# Patient Record
Sex: Male | Born: 1935
Health system: Southern US, Community
[De-identification: ages and names within clinical notes are randomized; demographics above are authoritative.]

## PROBLEM LIST (undated history)

## (undated) DIAGNOSIS — F419 Anxiety disorder, unspecified: Secondary | ICD-10-CM

## (undated) DIAGNOSIS — N400 Enlarged prostate without lower urinary tract symptoms: Secondary | ICD-10-CM

## (undated) DIAGNOSIS — M722 Plantar fascial fibromatosis: Secondary | ICD-10-CM

## (undated) DIAGNOSIS — E785 Hyperlipidemia, unspecified: Secondary | ICD-10-CM

## (undated) DIAGNOSIS — F329 Major depressive disorder, single episode, unspecified: Secondary | ICD-10-CM

## (undated) DIAGNOSIS — I6782 Cerebral ischemia: Secondary | ICD-10-CM

## (undated) DIAGNOSIS — F32A Depression, unspecified: Secondary | ICD-10-CM

## (undated) DIAGNOSIS — I700 Atherosclerosis of aorta: Secondary | ICD-10-CM

## (undated) DIAGNOSIS — D649 Anemia, unspecified: Secondary | ICD-10-CM

## (undated) DIAGNOSIS — I10 Essential (primary) hypertension: Secondary | ICD-10-CM

## (undated) DIAGNOSIS — C679 Malignant neoplasm of bladder, unspecified: Secondary | ICD-10-CM

## (undated) HISTORY — DX: Benign prostatic hyperplasia without lower urinary tract symptoms: N40.0

## (undated) HISTORY — DX: Anemia, unspecified: D64.9

## (undated) HISTORY — DX: Major depressive disorder, single episode, unspecified: F32.9

## (undated) HISTORY — DX: Cerebral ischemia: I67.82

## (undated) HISTORY — DX: Anxiety disorder, unspecified: F41.9

## (undated) HISTORY — DX: Plantar fascial fibromatosis: M72.2

## (undated) HISTORY — DX: Atherosclerosis of aorta: I70.0

## (undated) HISTORY — PX: TONSILLECTOMY: SUR1361

## (undated) HISTORY — PX: EYE SURGERY: SHX253

## (undated) HISTORY — DX: Essential (primary) hypertension: I10

## (undated) HISTORY — DX: Hyperlipidemia, unspecified: E78.5

## (undated) HISTORY — DX: Depression, unspecified: F32.A

## (undated) HISTORY — DX: Malignant neoplasm of bladder, unspecified: C67.9

---

## 2005-05-16 HISTORY — PX: BLADDER SURGERY: SHX569

## 2010-02-15 ENCOUNTER — Ambulatory Visit: Payer: Self-pay | Admitting: Cardiovascular Disease

## 2010-08-30 ENCOUNTER — Other Ambulatory Visit: Payer: Self-pay | Admitting: *Deleted

## 2010-08-30 ENCOUNTER — Encounter: Payer: Self-pay | Admitting: *Deleted

## 2010-08-30 DIAGNOSIS — Z79899 Other long term (current) drug therapy: Secondary | ICD-10-CM

## 2010-08-31 ENCOUNTER — Other Ambulatory Visit (INDEPENDENT_AMBULATORY_CARE_PROVIDER_SITE_OTHER): Payer: Medicare Other | Admitting: *Deleted

## 2010-08-31 ENCOUNTER — Ambulatory Visit (INDEPENDENT_AMBULATORY_CARE_PROVIDER_SITE_OTHER): Payer: Medicare Other | Admitting: Cardiovascular Disease

## 2010-08-31 ENCOUNTER — Encounter: Payer: Self-pay | Admitting: Cardiovascular Disease

## 2010-08-31 VITALS — BP 132/60 | HR 62 | Ht 71.0 in | Wt 207.0 lb

## 2010-08-31 DIAGNOSIS — I951 Orthostatic hypotension: Secondary | ICD-10-CM

## 2010-08-31 DIAGNOSIS — I10 Essential (primary) hypertension: Secondary | ICD-10-CM

## 2010-08-31 DIAGNOSIS — Z79899 Other long term (current) drug therapy: Secondary | ICD-10-CM

## 2010-08-31 HISTORY — DX: Orthostatic hypotension: I95.1

## 2010-08-31 LAB — BASIC METABOLIC PANEL
CO2: 26 mEq/L (ref 19–32)
Calcium: 9.6 mg/dL (ref 8.4–10.5)
Creatinine, Ser: 1 mg/dL (ref 0.4–1.5)
GFR: 73.42 mL/min (ref >60.00)
Sodium: 138 mEq/L (ref 135–145)

## 2010-08-31 MED ORDER — ATENOLOL 25 MG PO TABS: 25.0000 mg | ORAL_TABLET | Freq: Every day | ORAL | Status: DC

## 2010-08-31 NOTE — Progress Notes (Signed)
David Steele Date of Birth  [birth year] [facility] / [facility] [address]     [address] [place], [place]  [address] [phone]  Fax  [phone]  History of Present Illness:  Mr. David Steele is an elderly gentleman with a history of hypertension, diabetes mellitus, hyperlipidemia, and orthostatic hypotension. He has done fairly well since I last saw him 6 months ago.  He continues to have intermittent episodes of orthostasis.  Current Outpatient Prescriptions on File Prior to Visit  Medication Sig Dispense Refill  . aspirin 81 MG tablet Take 81 mg by mouth daily.        . finasteride (PROSCAR) 5 MG tablet Take 5 mg by mouth daily.        . Insulin Isophane Human (HUMULIN N [place]) Inject 30 Units into the skin daily at 6 (six) AM.        . metFORMIN (GLUCOPHAGE) 1000 MG tablet Take 1,000 mg by mouth daily with breakfast. 1 1/2 tab daily       . PARoxetine (PAXIL) 10 MG tablet Take 10 mg by mouth every morning.        . quinapril (ACCUPRIL) 40 MG tablet Take 40 mg by mouth at bedtime.        . simvastatin (ZOCOR) 40 MG tablet Take 40 mg by mouth at bedtime.        Marland Kitchen DISCONTD: atenolol (TENORMIN) 50 MG tablet Take 50 mg by mouth daily.          No Known Allergies  Past Medical History  Diagnosis Date  . Coronary artery disease   . Hypertension   . Hyperlipidemia   . Diabetes mellitus   . Bladder cancer   . Anxiety   . Depressive disorder   . Cerebral ischemia   . BPH (benign prostatic hyperplasia)   . Plantar fasciitis     Past Surgical History  Procedure Date  . Eye surgery   . Tonsillectomy     History  Smoking status  . Former Smoker  . Quit date: 05/16/1988  Smokeless tobacco  . Not on file    History  Alcohol Use No    History reviewed. No pertinent family history.  Reviw of Systems:  Reviewed in the HPI.  All other systems are negative.  Physical Exam: BP 132/60  Pulse 62  Ht 5\' 11"  (1.803 m)  Wt 207 lb (93.895  kg)  BMI 28.87 kg/m2 The patient is alert and oriented x 3.  The mood and affect are normal.  The skin is warm and dry.  Color is normal.  The HEENT exam reveals that the sclera are nonicteric.  The mucous membranes are moist.  The carotids are 2+ without bruits.  There is no thyromegaly.  There is no JVD.  The lungs are clear.  The chest wall is non tender.  The heart exam reveals a regular rate with a normal S1 and S2.  There are no murmurs, gallops, or rubs.  The PMI is not displaced.   Abdominal exam reveals good bowel sounds.  There is no guarding or rebound.  There is no hepatosplenomegaly or tenderness.  There are no masses.  Exam of the legs reveal no clubbing, cyanosis, or edema.  The legs are without rashes.  The distal pulses are intact.  Cranial nerves II - XII are intact.  Motor and sensory functions are intact.  The gait is normal.  Assessment / Plan:

## 2010-08-31 NOTE — Assessment & Plan Note (Signed)
David Steele is doing quite well from a cardiac standpoint. He continues to have occasional episodes of orthostatic hypotension. We will decrease his atenolol from 50 mg to 25 mg a day.

## 2010-09-02 ENCOUNTER — Telehealth: Payer: Self-pay | Admitting: *Deleted

## 2010-09-02 NOTE — Telephone Encounter (Signed)
Spoke with spouse and gave results of lab work to her

## 2010-09-06 ENCOUNTER — Telehealth: Payer: Self-pay | Admitting: *Deleted

## 2010-09-06 NOTE — Telephone Encounter (Signed)
Pt unable to hear me on phone, called PCP dr Johnston Ebbs  to leave msg with office and i faxed the dr with results of elevated glucose/ pt has appointment next week.Alfonso Ramus RN

## 2011-01-07 ENCOUNTER — Telehealth: Payer: Self-pay | Admitting: Cardiovascular Disease

## 2011-01-07 NOTE — Telephone Encounter (Signed)
Called pt to reschedule 02/14/11 dr nahser appt no answer

## 2011-02-14 ENCOUNTER — Ambulatory Visit: Payer: Medicare Other | Admitting: Cardiovascular Disease

## 2011-03-29 ENCOUNTER — Ambulatory Visit (INDEPENDENT_AMBULATORY_CARE_PROVIDER_SITE_OTHER): Payer: Medicare Other | Admitting: Cardiovascular Disease

## 2011-03-29 ENCOUNTER — Encounter: Payer: Self-pay | Admitting: Cardiovascular Disease

## 2011-03-29 DIAGNOSIS — I10 Essential (primary) hypertension: Secondary | ICD-10-CM

## 2011-03-29 DIAGNOSIS — I951 Orthostatic hypotension: Secondary | ICD-10-CM

## 2011-03-29 NOTE — Assessment & Plan Note (Signed)
His blood pressure has been well-controlled. We'll continue the same medications.

## 2011-03-29 NOTE — Assessment & Plan Note (Signed)
He's not had any recent episodes of orthostatic hypotension.

## 2011-03-29 NOTE — Patient Instructions (Signed)
Your physician wants you to follow-up in:  6 months. You will receive a reminder letter in the mail two months in advance. If you don't receive a letter, please call our office to schedule the follow-up appointment.   

## 2011-03-29 NOTE — Progress Notes (Signed)
  [place] Antolin Date of Birth  1925/10/14  [facility] [address]    [address] [place], [place]  [address] [phone]  Fax  [phone]  History of Present Illness:  Mr. Grieshop is an 77 year old gentleman with a history of hypertension, diabetes mellitus, and hyperlipidemia , and presyncope.   He has done fairly well. He has a history of some chest pains in the past and may have had a heart catheterization in [place].  I've seen him for the past several years. He's done for well. He needs fairly active. He's been be deer hunting. He's also been fishing without any episodes of chest pain or shortness of breath.    Current Outpatient Prescriptions on File Prior to Visit  Medication Sig Dispense Refill  . aspirin 81 MG tablet Take 81 mg by mouth daily.        Marland Kitchen atenolol (TENORMIN) 25 MG tablet Take 1 tablet (25 mg total) by mouth daily.  90 tablet  3  . finasteride (PROSCAR) 5 MG tablet Take 5 mg by mouth daily.        . Insulin Isophane Human (HUMULIN N [place]) Inject 30 Units into the skin daily at 6 (six) AM.        . metFORMIN (GLUCOPHAGE) 1000 MG tablet Take 1,000 mg by mouth daily with breakfast. 1 1/2 tab daily       . PARoxetine (PAXIL) 10 MG tablet Take 10 mg by mouth every morning.        . quinapril (ACCUPRIL) 40 MG tablet Take 40 mg by mouth at bedtime.        . simvastatin (ZOCOR) 40 MG tablet Take 40 mg by mouth at bedtime.          No Known Allergies  Past Medical History  Diagnosis Date  . Hypertension   . Hyperlipidemia   . Diabetes mellitus   . Bladder cancer   . Anxiety   . Depressive disorder   . Cerebral ischemia   . BPH (benign prostatic hyperplasia)   . Plantar fasciitis     Past Surgical History  Procedure Date  . Eye surgery   . Tonsillectomy     History  Smoking status  . Former Smoker  . Quit date: 05/16/1988  Smokeless tobacco  . Not on file    History  Alcohol Use No    No family history on file.  Reviw of  Systems:  Reviewed in the HPI.  All other systems are negative.  Physical Exam: BP 124/70  Pulse 50  Ht 5\' 11"  (1.803 m)  Wt 208 lb (94.348 kg)  BMI 29.01 kg/m2 The patient is alert and oriented x 3.  The mood and affect are normal.   Skin: warm and dry.  Color is normal.    HEENT:   Normocephalic/atraumatic. He has no JVD. Carotids are normal.  Lungs: His lungs are clear to auscultation.   Heart: Regular rate S1-S2.    Abdomen: His abdomen is soft. Good bowel sounds.  Extremities:  No clubbing sinuses or edema the  Neuro:  Exam is nonfocal.  His gait is normal.    ECG: Sinus bradycardia.  Assessment / Plan:

## 2011-08-04 DIAGNOSIS — E782 Mixed hyperlipidemia: Secondary | ICD-10-CM | POA: Diagnosis not present

## 2011-08-04 DIAGNOSIS — E119 Type 2 diabetes mellitus without complications: Secondary | ICD-10-CM | POA: Diagnosis not present

## 2011-08-04 DIAGNOSIS — I10 Essential (primary) hypertension: Secondary | ICD-10-CM | POA: Diagnosis not present

## 2011-08-04 DIAGNOSIS — Z79899 Other long term (current) drug therapy: Secondary | ICD-10-CM | POA: Diagnosis not present

## 2011-08-04 DIAGNOSIS — E1149 Type 2 diabetes mellitus with other diabetic neurological complication: Secondary | ICD-10-CM | POA: Diagnosis not present

## 2011-09-26 ENCOUNTER — Ambulatory Visit (INDEPENDENT_AMBULATORY_CARE_PROVIDER_SITE_OTHER): Payer: Medicare Other | Admitting: Cardiovascular Disease

## 2011-09-26 ENCOUNTER — Encounter: Payer: Self-pay | Admitting: Cardiovascular Disease

## 2011-09-26 VITALS — BP 137/66 | HR 55 | Ht 71.0 in | Wt 208.1 lb

## 2011-09-26 DIAGNOSIS — I10 Essential (primary) hypertension: Secondary | ICD-10-CM

## 2011-09-26 DIAGNOSIS — E785 Hyperlipidemia, unspecified: Secondary | ICD-10-CM

## 2011-09-26 DIAGNOSIS — E119 Type 2 diabetes mellitus without complications: Secondary | ICD-10-CM | POA: Diagnosis not present

## 2011-09-26 NOTE — Assessment & Plan Note (Signed)
David Steele is doing very well. We'll continue with the same medications. We'll check fasting labs today. I'll see him back in 6 months for an office visit, fasting labs, and EKG.

## 2011-09-26 NOTE — Progress Notes (Signed)
[name] Date of Birth  02-Sep-1925       [facility]    [place] [place] [address]  [address] [place], [place]  [address]   [place], [place]  [address] [phone]     [phone]   Fax  [phone]    Fax [phone]  Problem List: 1. History of presyncope 2. Hypertension 3. Diabetes mellitus 4. Hyperlipidemia 5. Small subdural hematoma by the MRI scan  History of Present Illness:  Mr. Kersh is an 78 year old gentleman with a history of hypertension, diabetes mellitus, and hyperlipidemia , and presyncope. He has done fairly well. He has a history of some chest pains in the past and may have had a heart catheterization in [place].   He's having arthritis pain.  He remains active working on his form. He's also garden. He is able to do all his normal activities without any significant problems.  He's had problems with dehydration and typically will have some presyncope if he becomes too dehydrated. When we talked about keeping some water or Gatorade in his tractor when he is out forming.   Current Outpatient Prescriptions on File Prior to Visit  Medication Sig Dispense Refill  . aspirin 81 MG tablet Take 81 mg by mouth daily.        Marland Kitchen atenolol (TENORMIN) 25 MG tablet Take 1 tablet (25 mg total) by mouth daily.  90 tablet  3  . finasteride (PROSCAR) 5 MG tablet Take 5 mg by mouth daily.        . Insulin Isophane Human (HUMULIN N [place]) Inject 30 Units into the skin daily at 6 (six) AM.        . metFORMIN (GLUCOPHAGE) 1000 MG tablet Take 1,000 mg by mouth daily with breakfast. 1 1/2 tab daily       . PARoxetine (PAXIL) 10 MG tablet Take 10 mg by mouth every morning.        . quinapril (ACCUPRIL) 40 MG tablet Take 40 mg by mouth at bedtime.        . simvastatin (ZOCOR) 40 MG tablet Take 40 mg by mouth at bedtime.          No Known Allergies  Past Medical History  Diagnosis Date  . Hypertension   . Hyperlipidemia   .  Diabetes mellitus   . Bladder cancer   . Anxiety   . Depressive disorder   . Cerebral ischemia   . BPH (benign prostatic hyperplasia)   . Plantar fasciitis     Past Surgical History  Procedure Date  . Eye surgery   . Tonsillectomy     History  Smoking status  . Former Smoker  . Quit date: 05/16/1988  Smokeless tobacco  . Not on file    History  Alcohol Use No    No family history on file.  Reviw of Systems:  Reviewed in the HPI.  All other systems are negative.  Physical Exam: Blood pressure 137/66, pulse 55, height 5\' 11"  (1.803 m), weight 208 lb 1.9 oz (94.403 kg). General: Well developed, well nourished, in no acute distress.  Head: Normocephalic, atraumatic, sclera non-icteric, mucus membranes are moist,   Neck: Supple. Carotids are 2 + without bruits. No JVD  Lungs: Clear bilaterally to auscultation.  Heart: regular rate.  normal  S1 S2. No murmurs, gallops or rubs.  Abdomen: Soft, non-tender, non-distended with normal bowel sounds. No hepatomegaly. No rebound/guarding. No masses.  Msk:  Strength and  tone are normal  Extremities: No clubbing or cyanosis. No edema.  Distal pedal pulses are 2+   Neuro: Alert and oriented X 3. Moves all extremities spontaneously.  Psych:  Responds to questions appropriately with a normal affect.  ECG:  Assessment / Plan:

## 2011-09-26 NOTE — Patient Instructions (Signed)
Your physician recommends that you return for a FASTING lipid profile: TODAY AND IN 6 MONTHS  Your physician wants you to follow-up in: 6 MONTHS  You will receive a reminder letter in the mail two months in advance. If you don't receive a letter, please call our office to schedule the follow-up appointment.   

## 2011-09-27 LAB — BASIC METABOLIC PANEL
CO2: 26 mEq/L (ref 19–32)
Chloride: 107 mEq/L (ref 96–112)
GFR: 82.39 mL/min (ref >60.00)
Glucose, Bld: 164 mg/dL — ABNORMAL HIGH (ref 70–99)
Potassium: 4.4 mEq/L (ref 3.5–5.1)
Sodium: 139 mEq/L (ref 135–145)

## 2011-09-27 LAB — HEPATIC FUNCTION PANEL
ALT: 16 U/L (ref 0–53)
Total Bilirubin: 0.7 mg/dL (ref 0.3–1.2)

## 2011-09-27 LAB — LIPID PANEL
HDL: 43.6 mg/dL (ref >39.00)
VLDL: 23.4 mg/dL (ref 0.0–40.0)

## 2011-11-10 DIAGNOSIS — I10 Essential (primary) hypertension: Secondary | ICD-10-CM | POA: Diagnosis not present

## 2011-11-10 DIAGNOSIS — E782 Mixed hyperlipidemia: Secondary | ICD-10-CM | POA: Diagnosis not present

## 2011-11-10 DIAGNOSIS — E119 Type 2 diabetes mellitus without complications: Secondary | ICD-10-CM | POA: Diagnosis not present

## 2012-02-16 DIAGNOSIS — I10 Essential (primary) hypertension: Secondary | ICD-10-CM | POA: Diagnosis not present

## 2012-02-16 DIAGNOSIS — E782 Mixed hyperlipidemia: Secondary | ICD-10-CM | POA: Diagnosis not present

## 2012-02-16 DIAGNOSIS — Z79899 Other long term (current) drug therapy: Secondary | ICD-10-CM | POA: Diagnosis not present

## 2012-02-16 DIAGNOSIS — E119 Type 2 diabetes mellitus without complications: Secondary | ICD-10-CM | POA: Diagnosis not present

## 2012-02-16 DIAGNOSIS — Z23 Encounter for immunization: Secondary | ICD-10-CM | POA: Diagnosis not present

## 2012-02-17 DIAGNOSIS — S058X9A Other injuries of unspecified eye and orbit, initial encounter: Secondary | ICD-10-CM | POA: Diagnosis not present

## 2012-02-17 DIAGNOSIS — T1590XA Foreign body on external eye, part unspecified, unspecified eye, initial encounter: Secondary | ICD-10-CM | POA: Diagnosis not present

## 2012-02-17 DIAGNOSIS — T1500XA Foreign body in cornea, unspecified eye, initial encounter: Secondary | ICD-10-CM | POA: Diagnosis not present

## 2012-02-20 DIAGNOSIS — H44619 Retained (old) magnetic foreign body in anterior chamber, unspecified eye: Secondary | ICD-10-CM | POA: Diagnosis not present

## 2012-02-21 DIAGNOSIS — H44619 Retained (old) magnetic foreign body in anterior chamber, unspecified eye: Secondary | ICD-10-CM | POA: Diagnosis not present

## 2012-03-15 DIAGNOSIS — Z961 Presence of intraocular lens: Secondary | ICD-10-CM | POA: Diagnosis not present

## 2012-04-16 DIAGNOSIS — C679 Malignant neoplasm of bladder, unspecified: Secondary | ICD-10-CM | POA: Diagnosis not present

## 2012-04-16 DIAGNOSIS — C61 Malignant neoplasm of prostate: Secondary | ICD-10-CM | POA: Diagnosis not present

## 2012-05-31 DIAGNOSIS — I10 Essential (primary) hypertension: Secondary | ICD-10-CM | POA: Diagnosis not present

## 2012-05-31 DIAGNOSIS — Z79899 Other long term (current) drug therapy: Secondary | ICD-10-CM | POA: Diagnosis not present

## 2012-05-31 DIAGNOSIS — E782 Mixed hyperlipidemia: Secondary | ICD-10-CM | POA: Diagnosis not present

## 2012-05-31 DIAGNOSIS — E119 Type 2 diabetes mellitus without complications: Secondary | ICD-10-CM | POA: Diagnosis not present

## 2012-07-18 DIAGNOSIS — S61409A Unspecified open wound of unspecified hand, initial encounter: Secondary | ICD-10-CM | POA: Diagnosis not present

## 2012-09-11 DIAGNOSIS — E782 Mixed hyperlipidemia: Secondary | ICD-10-CM | POA: Diagnosis not present

## 2012-09-11 DIAGNOSIS — E119 Type 2 diabetes mellitus without complications: Secondary | ICD-10-CM | POA: Diagnosis not present

## 2012-09-11 DIAGNOSIS — Z79899 Other long term (current) drug therapy: Secondary | ICD-10-CM | POA: Diagnosis not present

## 2012-09-11 DIAGNOSIS — I10 Essential (primary) hypertension: Secondary | ICD-10-CM | POA: Diagnosis not present

## 2012-10-30 DIAGNOSIS — N471 Phimosis: Secondary | ICD-10-CM | POA: Diagnosis not present

## 2012-10-30 DIAGNOSIS — C679 Malignant neoplasm of bladder, unspecified: Secondary | ICD-10-CM | POA: Diagnosis not present

## 2012-10-30 DIAGNOSIS — C61 Malignant neoplasm of prostate: Secondary | ICD-10-CM | POA: Diagnosis not present

## 2012-12-18 DIAGNOSIS — L02818 Cutaneous abscess of other sites: Secondary | ICD-10-CM | POA: Diagnosis not present

## 2012-12-18 DIAGNOSIS — E782 Mixed hyperlipidemia: Secondary | ICD-10-CM | POA: Diagnosis not present

## 2012-12-18 DIAGNOSIS — E119 Type 2 diabetes mellitus without complications: Secondary | ICD-10-CM | POA: Diagnosis not present

## 2012-12-18 DIAGNOSIS — Z79899 Other long term (current) drug therapy: Secondary | ICD-10-CM | POA: Diagnosis not present

## 2012-12-18 DIAGNOSIS — I10 Essential (primary) hypertension: Secondary | ICD-10-CM | POA: Diagnosis not present

## 2013-02-27 DIAGNOSIS — Z23 Encounter for immunization: Secondary | ICD-10-CM | POA: Diagnosis not present

## 2013-04-18 DIAGNOSIS — I10 Essential (primary) hypertension: Secondary | ICD-10-CM | POA: Diagnosis not present

## 2013-04-18 DIAGNOSIS — E119 Type 2 diabetes mellitus without complications: Secondary | ICD-10-CM | POA: Diagnosis not present

## 2013-04-18 DIAGNOSIS — Z79899 Other long term (current) drug therapy: Secondary | ICD-10-CM | POA: Diagnosis not present

## 2013-04-18 DIAGNOSIS — E782 Mixed hyperlipidemia: Secondary | ICD-10-CM | POA: Diagnosis not present

## 2013-04-29 DIAGNOSIS — C679 Malignant neoplasm of bladder, unspecified: Secondary | ICD-10-CM | POA: Diagnosis not present

## 2013-04-29 DIAGNOSIS — C61 Malignant neoplasm of prostate: Secondary | ICD-10-CM | POA: Diagnosis not present

## 2013-04-29 DIAGNOSIS — D090 Carcinoma in situ of bladder: Secondary | ICD-10-CM | POA: Diagnosis not present

## 2013-08-30 DIAGNOSIS — E785 Hyperlipidemia, unspecified: Secondary | ICD-10-CM | POA: Diagnosis not present

## 2013-08-30 DIAGNOSIS — I10 Essential (primary) hypertension: Secondary | ICD-10-CM | POA: Diagnosis not present

## 2013-08-30 DIAGNOSIS — IMO0001 Reserved for inherently not codable concepts without codable children: Secondary | ICD-10-CM | POA: Diagnosis not present

## 2013-08-30 DIAGNOSIS — Z79899 Other long term (current) drug therapy: Secondary | ICD-10-CM | POA: Diagnosis not present

## 2013-10-12 DIAGNOSIS — J189 Pneumonia, unspecified organism: Secondary | ICD-10-CM | POA: Diagnosis not present

## 2013-12-30 DIAGNOSIS — I10 Essential (primary) hypertension: Secondary | ICD-10-CM | POA: Diagnosis not present

## 2013-12-30 DIAGNOSIS — E785 Hyperlipidemia, unspecified: Secondary | ICD-10-CM | POA: Diagnosis not present

## 2013-12-30 DIAGNOSIS — R42 Dizziness and giddiness: Secondary | ICD-10-CM | POA: Diagnosis not present

## 2013-12-30 DIAGNOSIS — IMO0001 Reserved for inherently not codable concepts without codable children: Secondary | ICD-10-CM | POA: Diagnosis not present

## 2013-12-30 DIAGNOSIS — Z79899 Other long term (current) drug therapy: Secondary | ICD-10-CM | POA: Diagnosis not present

## 2014-01-17 DIAGNOSIS — R05 Cough: Secondary | ICD-10-CM | POA: Diagnosis not present

## 2014-01-17 DIAGNOSIS — R059 Cough, unspecified: Secondary | ICD-10-CM | POA: Diagnosis not present

## 2014-01-17 DIAGNOSIS — IMO0001 Reserved for inherently not codable concepts without codable children: Secondary | ICD-10-CM | POA: Diagnosis not present

## 2014-02-26 DIAGNOSIS — Z23 Encounter for immunization: Secondary | ICD-10-CM | POA: Diagnosis not present

## 2014-04-28 DIAGNOSIS — R51 Headache: Secondary | ICD-10-CM | POA: Diagnosis not present

## 2014-04-28 DIAGNOSIS — H538 Other visual disturbances: Secondary | ICD-10-CM | POA: Diagnosis not present

## 2014-04-28 DIAGNOSIS — E119 Type 2 diabetes mellitus without complications: Secondary | ICD-10-CM | POA: Diagnosis not present

## 2014-04-28 DIAGNOSIS — D1800 Hemangioma unspecified site: Secondary | ICD-10-CM | POA: Diagnosis not present

## 2014-04-28 DIAGNOSIS — N4829 Other inflammatory disorders of penis: Secondary | ICD-10-CM | POA: Diagnosis not present

## 2014-04-28 DIAGNOSIS — Z7982 Long term (current) use of aspirin: Secondary | ICD-10-CM | POA: Diagnosis not present

## 2014-04-28 DIAGNOSIS — E780 Pure hypercholesterolemia: Secondary | ICD-10-CM | POA: Diagnosis not present

## 2014-04-28 DIAGNOSIS — R531 Weakness: Secondary | ICD-10-CM | POA: Diagnosis not present

## 2014-04-28 DIAGNOSIS — I10 Essential (primary) hypertension: Secondary | ICD-10-CM | POA: Diagnosis not present

## 2014-05-01 DIAGNOSIS — D1801 Hemangioma of skin and subcutaneous tissue: Secondary | ICD-10-CM | POA: Diagnosis not present

## 2014-05-01 DIAGNOSIS — E1165 Type 2 diabetes mellitus with hyperglycemia: Secondary | ICD-10-CM | POA: Diagnosis not present

## 2014-05-01 DIAGNOSIS — E785 Hyperlipidemia, unspecified: Secondary | ICD-10-CM | POA: Diagnosis not present

## 2014-05-12 DIAGNOSIS — C679 Malignant neoplasm of bladder, unspecified: Secondary | ICD-10-CM | POA: Diagnosis not present

## 2014-05-12 DIAGNOSIS — C61 Malignant neoplasm of prostate: Secondary | ICD-10-CM | POA: Diagnosis not present

## 2014-05-12 DIAGNOSIS — Z87891 Personal history of nicotine dependence: Secondary | ICD-10-CM | POA: Diagnosis not present

## 2014-05-12 DIAGNOSIS — N320 Bladder-neck obstruction: Secondary | ICD-10-CM | POA: Diagnosis not present

## 2014-05-29 DIAGNOSIS — Z6827 Body mass index (BMI) 27.0-27.9, adult: Secondary | ICD-10-CM | POA: Diagnosis not present

## 2014-05-29 DIAGNOSIS — I10 Essential (primary) hypertension: Secondary | ICD-10-CM | POA: Diagnosis not present

## 2014-05-29 DIAGNOSIS — D1801 Hemangioma of skin and subcutaneous tissue: Secondary | ICD-10-CM | POA: Diagnosis not present

## 2014-06-10 DIAGNOSIS — F329 Major depressive disorder, single episode, unspecified: Secondary | ICD-10-CM | POA: Diagnosis not present

## 2014-06-10 DIAGNOSIS — F419 Anxiety disorder, unspecified: Secondary | ICD-10-CM | POA: Diagnosis not present

## 2014-06-10 DIAGNOSIS — G4700 Insomnia, unspecified: Secondary | ICD-10-CM | POA: Diagnosis not present

## 2014-07-22 DIAGNOSIS — F331 Major depressive disorder, recurrent, moderate: Secondary | ICD-10-CM | POA: Diagnosis not present

## 2014-07-22 DIAGNOSIS — G4700 Insomnia, unspecified: Secondary | ICD-10-CM | POA: Diagnosis not present

## 2014-08-12 DIAGNOSIS — C679 Malignant neoplasm of bladder, unspecified: Secondary | ICD-10-CM | POA: Diagnosis not present

## 2014-08-12 DIAGNOSIS — N320 Bladder-neck obstruction: Secondary | ICD-10-CM | POA: Diagnosis not present

## 2014-08-12 DIAGNOSIS — C61 Malignant neoplasm of prostate: Secondary | ICD-10-CM | POA: Diagnosis not present

## 2014-09-23 DIAGNOSIS — M79606 Pain in leg, unspecified: Secondary | ICD-10-CM | POA: Diagnosis not present

## 2014-09-23 DIAGNOSIS — I10 Essential (primary) hypertension: Secondary | ICD-10-CM | POA: Diagnosis not present

## 2014-09-23 DIAGNOSIS — E785 Hyperlipidemia, unspecified: Secondary | ICD-10-CM | POA: Diagnosis not present

## 2014-09-23 DIAGNOSIS — Z79899 Other long term (current) drug therapy: Secondary | ICD-10-CM | POA: Diagnosis not present

## 2014-09-23 DIAGNOSIS — E1165 Type 2 diabetes mellitus with hyperglycemia: Secondary | ICD-10-CM | POA: Diagnosis not present

## 2014-10-23 DIAGNOSIS — Z23 Encounter for immunization: Secondary | ICD-10-CM | POA: Diagnosis not present

## 2014-10-24 DIAGNOSIS — Z79899 Other long term (current) drug therapy: Secondary | ICD-10-CM | POA: Diagnosis not present

## 2014-10-24 DIAGNOSIS — R7989 Other specified abnormal findings of blood chemistry: Secondary | ICD-10-CM | POA: Diagnosis not present

## 2014-12-29 DIAGNOSIS — Z23 Encounter for immunization: Secondary | ICD-10-CM | POA: Diagnosis not present

## 2015-01-26 DIAGNOSIS — E1165 Type 2 diabetes mellitus with hyperglycemia: Secondary | ICD-10-CM | POA: Diagnosis not present

## 2015-01-26 DIAGNOSIS — R51 Headache: Secondary | ICD-10-CM | POA: Diagnosis not present

## 2015-01-26 DIAGNOSIS — I10 Essential (primary) hypertension: Secondary | ICD-10-CM | POA: Diagnosis not present

## 2015-01-26 DIAGNOSIS — E785 Hyperlipidemia, unspecified: Secondary | ICD-10-CM | POA: Diagnosis not present

## 2015-01-26 DIAGNOSIS — Z79899 Other long term (current) drug therapy: Secondary | ICD-10-CM | POA: Diagnosis not present

## 2015-02-24 DIAGNOSIS — N472 Paraphimosis: Secondary | ICD-10-CM | POA: Diagnosis not present

## 2015-02-24 DIAGNOSIS — D090 Carcinoma in situ of bladder: Secondary | ICD-10-CM | POA: Diagnosis not present

## 2015-02-24 DIAGNOSIS — C679 Malignant neoplasm of bladder, unspecified: Secondary | ICD-10-CM | POA: Diagnosis not present

## 2015-02-24 DIAGNOSIS — C61 Malignant neoplasm of prostate: Secondary | ICD-10-CM | POA: Diagnosis not present

## 2015-03-27 DIAGNOSIS — C61 Malignant neoplasm of prostate: Secondary | ICD-10-CM | POA: Diagnosis not present

## 2015-05-28 DIAGNOSIS — J40 Bronchitis, not specified as acute or chronic: Secondary | ICD-10-CM | POA: Diagnosis not present

## 2015-05-28 DIAGNOSIS — E785 Hyperlipidemia, unspecified: Secondary | ICD-10-CM | POA: Diagnosis not present

## 2015-05-28 DIAGNOSIS — Z79899 Other long term (current) drug therapy: Secondary | ICD-10-CM | POA: Diagnosis not present

## 2015-05-28 DIAGNOSIS — F419 Anxiety disorder, unspecified: Secondary | ICD-10-CM | POA: Diagnosis not present

## 2015-05-28 DIAGNOSIS — I10 Essential (primary) hypertension: Secondary | ICD-10-CM | POA: Diagnosis not present

## 2015-05-28 DIAGNOSIS — E1165 Type 2 diabetes mellitus with hyperglycemia: Secondary | ICD-10-CM | POA: Diagnosis not present

## 2015-07-15 DIAGNOSIS — C61 Malignant neoplasm of prostate: Secondary | ICD-10-CM | POA: Diagnosis not present

## 2015-07-15 DIAGNOSIS — R3915 Urgency of urination: Secondary | ICD-10-CM | POA: Diagnosis not present

## 2015-07-15 DIAGNOSIS — D090 Carcinoma in situ of bladder: Secondary | ICD-10-CM | POA: Diagnosis not present

## 2015-08-21 DIAGNOSIS — C61 Malignant neoplasm of prostate: Secondary | ICD-10-CM | POA: Diagnosis not present

## 2015-08-21 DIAGNOSIS — Z789 Other specified health status: Secondary | ICD-10-CM | POA: Diagnosis not present

## 2015-08-21 DIAGNOSIS — Z87891 Personal history of nicotine dependence: Secondary | ICD-10-CM | POA: Diagnosis not present

## 2015-08-21 DIAGNOSIS — R351 Nocturia: Secondary | ICD-10-CM | POA: Diagnosis not present

## 2015-09-25 DIAGNOSIS — I10 Essential (primary) hypertension: Secondary | ICD-10-CM | POA: Diagnosis not present

## 2015-09-25 DIAGNOSIS — E785 Hyperlipidemia, unspecified: Secondary | ICD-10-CM | POA: Diagnosis not present

## 2015-09-25 DIAGNOSIS — M25562 Pain in left knee: Secondary | ICD-10-CM | POA: Diagnosis not present

## 2015-09-25 DIAGNOSIS — E1165 Type 2 diabetes mellitus with hyperglycemia: Secondary | ICD-10-CM | POA: Diagnosis not present

## 2015-09-25 DIAGNOSIS — F419 Anxiety disorder, unspecified: Secondary | ICD-10-CM | POA: Diagnosis not present

## 2015-09-25 DIAGNOSIS — Z79899 Other long term (current) drug therapy: Secondary | ICD-10-CM | POA: Diagnosis not present

## 2015-10-05 DIAGNOSIS — M1712 Unilateral primary osteoarthritis, left knee: Secondary | ICD-10-CM | POA: Diagnosis not present

## 2015-11-05 DIAGNOSIS — M1711 Unilateral primary osteoarthritis, right knee: Secondary | ICD-10-CM | POA: Diagnosis not present

## 2015-11-10 DIAGNOSIS — I10 Essential (primary) hypertension: Secondary | ICD-10-CM | POA: Diagnosis not present

## 2015-11-10 DIAGNOSIS — H60501 Unspecified acute noninfective otitis externa, right ear: Secondary | ICD-10-CM | POA: Diagnosis not present

## 2015-11-13 DIAGNOSIS — H9201 Otalgia, right ear: Secondary | ICD-10-CM | POA: Diagnosis not present

## 2015-11-13 DIAGNOSIS — H60501 Unspecified acute noninfective otitis externa, right ear: Secondary | ICD-10-CM | POA: Diagnosis not present

## 2015-11-19 DIAGNOSIS — H9211 Otorrhea, right ear: Secondary | ICD-10-CM | POA: Diagnosis not present

## 2015-11-19 DIAGNOSIS — H9201 Otalgia, right ear: Secondary | ICD-10-CM | POA: Diagnosis not present

## 2015-11-21 DIAGNOSIS — Z23 Encounter for immunization: Secondary | ICD-10-CM | POA: Diagnosis not present

## 2015-12-10 DIAGNOSIS — E1165 Type 2 diabetes mellitus with hyperglycemia: Secondary | ICD-10-CM | POA: Diagnosis not present

## 2015-12-10 DIAGNOSIS — I10 Essential (primary) hypertension: Secondary | ICD-10-CM | POA: Diagnosis not present

## 2015-12-17 DIAGNOSIS — M1711 Unilateral primary osteoarthritis, right knee: Secondary | ICD-10-CM | POA: Diagnosis not present

## 2015-12-21 DIAGNOSIS — I10 Essential (primary) hypertension: Secondary | ICD-10-CM | POA: Diagnosis not present

## 2015-12-21 DIAGNOSIS — H6123 Impacted cerumen, bilateral: Secondary | ICD-10-CM | POA: Diagnosis not present

## 2016-01-08 ENCOUNTER — Other Ambulatory Visit: Payer: Self-pay

## 2016-01-19 DIAGNOSIS — C61 Malignant neoplasm of prostate: Secondary | ICD-10-CM | POA: Diagnosis not present

## 2016-01-19 DIAGNOSIS — D090 Carcinoma in situ of bladder: Secondary | ICD-10-CM | POA: Diagnosis not present

## 2016-01-21 DIAGNOSIS — Z23 Encounter for immunization: Secondary | ICD-10-CM | POA: Diagnosis not present

## 2016-01-26 DIAGNOSIS — R0602 Shortness of breath: Secondary | ICD-10-CM | POA: Diagnosis not present

## 2016-01-26 DIAGNOSIS — E1165 Type 2 diabetes mellitus with hyperglycemia: Secondary | ICD-10-CM | POA: Diagnosis not present

## 2016-01-26 DIAGNOSIS — E785 Hyperlipidemia, unspecified: Secondary | ICD-10-CM | POA: Diagnosis not present

## 2016-01-26 DIAGNOSIS — I10 Essential (primary) hypertension: Secondary | ICD-10-CM | POA: Diagnosis not present

## 2016-01-26 DIAGNOSIS — Z79899 Other long term (current) drug therapy: Secondary | ICD-10-CM | POA: Diagnosis not present

## 2016-01-26 DIAGNOSIS — F419 Anxiety disorder, unspecified: Secondary | ICD-10-CM | POA: Diagnosis not present

## 2016-05-26 DIAGNOSIS — Z79899 Other long term (current) drug therapy: Secondary | ICD-10-CM | POA: Diagnosis not present

## 2016-05-26 DIAGNOSIS — I10 Essential (primary) hypertension: Secondary | ICD-10-CM | POA: Diagnosis not present

## 2016-05-26 DIAGNOSIS — E1165 Type 2 diabetes mellitus with hyperglycemia: Secondary | ICD-10-CM | POA: Diagnosis not present

## 2016-05-26 DIAGNOSIS — F419 Anxiety disorder, unspecified: Secondary | ICD-10-CM | POA: Diagnosis not present

## 2016-05-26 DIAGNOSIS — G4700 Insomnia, unspecified: Secondary | ICD-10-CM | POA: Diagnosis not present

## 2016-06-07 DIAGNOSIS — E875 Hyperkalemia: Secondary | ICD-10-CM | POA: Diagnosis not present

## 2016-07-19 DIAGNOSIS — C61 Malignant neoplasm of prostate: Secondary | ICD-10-CM | POA: Diagnosis not present

## 2016-08-01 DIAGNOSIS — H61303 Acquired stenosis of external ear canal, unspecified, bilateral: Secondary | ICD-10-CM | POA: Diagnosis not present

## 2016-08-01 DIAGNOSIS — H9193 Unspecified hearing loss, bilateral: Secondary | ICD-10-CM | POA: Diagnosis not present

## 2016-08-01 DIAGNOSIS — H6121 Impacted cerumen, right ear: Secondary | ICD-10-CM | POA: Diagnosis not present

## 2016-09-27 DIAGNOSIS — Z79899 Other long term (current) drug therapy: Secondary | ICD-10-CM | POA: Diagnosis not present

## 2016-09-27 DIAGNOSIS — I10 Essential (primary) hypertension: Secondary | ICD-10-CM | POA: Diagnosis not present

## 2016-09-27 DIAGNOSIS — E1165 Type 2 diabetes mellitus with hyperglycemia: Secondary | ICD-10-CM | POA: Diagnosis not present

## 2016-12-26 DIAGNOSIS — Z23 Encounter for immunization: Secondary | ICD-10-CM | POA: Diagnosis not present

## 2017-01-06 DIAGNOSIS — E119 Type 2 diabetes mellitus without complications: Secondary | ICD-10-CM | POA: Diagnosis not present

## 2017-01-12 DIAGNOSIS — H401131 Primary open-angle glaucoma, bilateral, mild stage: Secondary | ICD-10-CM | POA: Diagnosis not present

## 2017-01-20 DIAGNOSIS — C679 Malignant neoplasm of bladder, unspecified: Secondary | ICD-10-CM | POA: Diagnosis not present

## 2017-01-20 DIAGNOSIS — C61 Malignant neoplasm of prostate: Secondary | ICD-10-CM | POA: Diagnosis not present

## 2017-01-31 DIAGNOSIS — I10 Essential (primary) hypertension: Secondary | ICD-10-CM | POA: Diagnosis not present

## 2017-01-31 DIAGNOSIS — I480 Paroxysmal atrial fibrillation: Secondary | ICD-10-CM | POA: Diagnosis not present

## 2017-01-31 DIAGNOSIS — E1165 Type 2 diabetes mellitus with hyperglycemia: Secondary | ICD-10-CM | POA: Diagnosis not present

## 2017-01-31 DIAGNOSIS — Z79899 Other long term (current) drug therapy: Secondary | ICD-10-CM | POA: Diagnosis not present

## 2017-01-31 DIAGNOSIS — R42 Dizziness and giddiness: Secondary | ICD-10-CM | POA: Diagnosis not present

## 2017-01-31 DIAGNOSIS — E785 Hyperlipidemia, unspecified: Secondary | ICD-10-CM | POA: Diagnosis not present

## 2017-01-31 DIAGNOSIS — R531 Weakness: Secondary | ICD-10-CM | POA: Diagnosis not present

## 2017-01-31 DIAGNOSIS — J300 Vasomotor rhinitis: Secondary | ICD-10-CM | POA: Diagnosis not present

## 2017-02-06 DIAGNOSIS — H401131 Primary open-angle glaucoma, bilateral, mild stage: Secondary | ICD-10-CM | POA: Diagnosis not present

## 2017-02-23 DIAGNOSIS — I480 Paroxysmal atrial fibrillation: Secondary | ICD-10-CM

## 2017-02-23 DIAGNOSIS — Z794 Long term (current) use of insulin: Secondary | ICD-10-CM | POA: Diagnosis not present

## 2017-02-23 DIAGNOSIS — R55 Syncope and collapse: Secondary | ICD-10-CM

## 2017-02-23 DIAGNOSIS — R0600 Dyspnea, unspecified: Secondary | ICD-10-CM

## 2017-02-23 DIAGNOSIS — E119 Type 2 diabetes mellitus without complications: Secondary | ICD-10-CM | POA: Diagnosis not present

## 2017-02-23 DIAGNOSIS — R0609 Other forms of dyspnea: Secondary | ICD-10-CM

## 2017-02-23 DIAGNOSIS — L299 Pruritus, unspecified: Secondary | ICD-10-CM | POA: Diagnosis not present

## 2017-02-23 DIAGNOSIS — R42 Dizziness and giddiness: Secondary | ICD-10-CM

## 2017-02-23 DIAGNOSIS — Z8551 Personal history of malignant neoplasm of bladder: Secondary | ICD-10-CM

## 2017-02-23 DIAGNOSIS — H9193 Unspecified hearing loss, bilateral: Secondary | ICD-10-CM | POA: Diagnosis not present

## 2017-02-23 DIAGNOSIS — R001 Bradycardia, unspecified: Secondary | ICD-10-CM

## 2017-02-23 DIAGNOSIS — I10 Essential (primary) hypertension: Secondary | ICD-10-CM | POA: Insufficient documentation

## 2017-02-23 DIAGNOSIS — R5383 Other fatigue: Secondary | ICD-10-CM

## 2017-02-23 HISTORY — DX: Other forms of dyspnea: R06.09

## 2017-02-23 HISTORY — DX: Bradycardia, unspecified: R00.1

## 2017-02-23 HISTORY — DX: Dizziness and giddiness: R42

## 2017-02-23 HISTORY — DX: Essential (primary) hypertension: I10

## 2017-02-23 HISTORY — DX: Other fatigue: R53.83

## 2017-02-23 HISTORY — DX: Syncope and collapse: R55

## 2017-02-23 HISTORY — DX: Paroxysmal atrial fibrillation: I48.0

## 2017-02-23 HISTORY — DX: Personal history of malignant neoplasm of bladder: Z85.51

## 2017-02-23 HISTORY — DX: Dyspnea, unspecified: R06.00

## 2017-02-24 DIAGNOSIS — R001 Bradycardia, unspecified: Secondary | ICD-10-CM | POA: Diagnosis not present

## 2017-02-24 DIAGNOSIS — I480 Paroxysmal atrial fibrillation: Secondary | ICD-10-CM | POA: Diagnosis not present

## 2017-03-07 DIAGNOSIS — H401131 Primary open-angle glaucoma, bilateral, mild stage: Secondary | ICD-10-CM | POA: Diagnosis not present

## 2017-03-09 DIAGNOSIS — J40 Bronchitis, not specified as acute or chronic: Secondary | ICD-10-CM | POA: Diagnosis not present

## 2017-03-09 DIAGNOSIS — R5381 Other malaise: Secondary | ICD-10-CM | POA: Diagnosis not present

## 2017-03-09 DIAGNOSIS — Z79899 Other long term (current) drug therapy: Secondary | ICD-10-CM | POA: Diagnosis not present

## 2017-03-09 DIAGNOSIS — R05 Cough: Secondary | ICD-10-CM | POA: Diagnosis not present

## 2017-03-09 DIAGNOSIS — R062 Wheezing: Secondary | ICD-10-CM | POA: Diagnosis not present

## 2017-04-03 DIAGNOSIS — H9193 Unspecified hearing loss, bilateral: Secondary | ICD-10-CM | POA: Diagnosis not present

## 2017-04-03 DIAGNOSIS — R05 Cough: Secondary | ICD-10-CM | POA: Diagnosis not present

## 2017-04-03 DIAGNOSIS — R109 Unspecified abdominal pain: Secondary | ICD-10-CM | POA: Diagnosis not present

## 2017-04-03 DIAGNOSIS — F419 Anxiety disorder, unspecified: Secondary | ICD-10-CM | POA: Diagnosis not present

## 2017-04-03 DIAGNOSIS — E1165 Type 2 diabetes mellitus with hyperglycemia: Secondary | ICD-10-CM | POA: Diagnosis not present

## 2017-04-03 DIAGNOSIS — I480 Paroxysmal atrial fibrillation: Secondary | ICD-10-CM | POA: Diagnosis not present

## 2017-04-03 DIAGNOSIS — I10 Essential (primary) hypertension: Secondary | ICD-10-CM | POA: Diagnosis not present

## 2017-04-04 DIAGNOSIS — J342 Deviated nasal septum: Secondary | ICD-10-CM | POA: Diagnosis not present

## 2017-04-04 DIAGNOSIS — H6121 Impacted cerumen, right ear: Secondary | ICD-10-CM | POA: Diagnosis not present

## 2017-04-04 DIAGNOSIS — Z77122 Contact with and (suspected) exposure to noise: Secondary | ICD-10-CM | POA: Diagnosis not present

## 2017-04-04 DIAGNOSIS — J40 Bronchitis, not specified as acute or chronic: Secondary | ICD-10-CM | POA: Diagnosis not present

## 2017-04-04 DIAGNOSIS — H9319 Tinnitus, unspecified ear: Secondary | ICD-10-CM | POA: Diagnosis not present

## 2017-04-04 DIAGNOSIS — I700 Atherosclerosis of aorta: Secondary | ICD-10-CM | POA: Diagnosis not present

## 2017-04-04 DIAGNOSIS — R05 Cough: Secondary | ICD-10-CM | POA: Diagnosis not present

## 2017-04-04 DIAGNOSIS — H93299 Other abnormal auditory perceptions, unspecified ear: Secondary | ICD-10-CM | POA: Diagnosis not present

## 2017-04-04 DIAGNOSIS — H903 Sensorineural hearing loss, bilateral: Secondary | ICD-10-CM | POA: Diagnosis not present

## 2017-04-13 DIAGNOSIS — Z7982 Long term (current) use of aspirin: Secondary | ICD-10-CM | POA: Diagnosis not present

## 2017-04-13 DIAGNOSIS — E119 Type 2 diabetes mellitus without complications: Secondary | ICD-10-CM | POA: Diagnosis not present

## 2017-04-13 DIAGNOSIS — Z87891 Personal history of nicotine dependence: Secondary | ICD-10-CM | POA: Diagnosis not present

## 2017-04-13 DIAGNOSIS — Z794 Long term (current) use of insulin: Secondary | ICD-10-CM | POA: Diagnosis not present

## 2017-04-13 DIAGNOSIS — R001 Bradycardia, unspecified: Secondary | ICD-10-CM | POA: Diagnosis not present

## 2017-04-13 DIAGNOSIS — Z7901 Long term (current) use of anticoagulants: Secondary | ICD-10-CM | POA: Diagnosis not present

## 2017-04-13 DIAGNOSIS — I480 Paroxysmal atrial fibrillation: Secondary | ICD-10-CM | POA: Diagnosis not present

## 2017-04-13 DIAGNOSIS — I10 Essential (primary) hypertension: Secondary | ICD-10-CM | POA: Diagnosis not present

## 2017-04-13 DIAGNOSIS — I4891 Unspecified atrial fibrillation: Secondary | ICD-10-CM | POA: Diagnosis not present

## 2017-04-13 DIAGNOSIS — Z8551 Personal history of malignant neoplasm of bladder: Secondary | ICD-10-CM | POA: Diagnosis not present

## 2017-04-14 DIAGNOSIS — I4891 Unspecified atrial fibrillation: Secondary | ICD-10-CM | POA: Diagnosis not present

## 2017-04-18 DIAGNOSIS — H401131 Primary open-angle glaucoma, bilateral, mild stage: Secondary | ICD-10-CM | POA: Diagnosis not present

## 2017-06-15 DIAGNOSIS — Z87891 Personal history of nicotine dependence: Secondary | ICD-10-CM | POA: Insufficient documentation

## 2017-06-15 HISTORY — DX: Personal history of nicotine dependence: Z87.891

## 2017-07-04 DIAGNOSIS — I10 Essential (primary) hypertension: Secondary | ICD-10-CM | POA: Diagnosis not present

## 2017-07-04 DIAGNOSIS — E1165 Type 2 diabetes mellitus with hyperglycemia: Secondary | ICD-10-CM | POA: Diagnosis not present

## 2017-07-04 DIAGNOSIS — I480 Paroxysmal atrial fibrillation: Secondary | ICD-10-CM | POA: Diagnosis not present

## 2017-07-04 DIAGNOSIS — H9193 Unspecified hearing loss, bilateral: Secondary | ICD-10-CM | POA: Diagnosis not present

## 2017-07-04 DIAGNOSIS — Z79899 Other long term (current) drug therapy: Secondary | ICD-10-CM | POA: Diagnosis not present

## 2017-07-25 DIAGNOSIS — C61 Malignant neoplasm of prostate: Secondary | ICD-10-CM | POA: Diagnosis not present

## 2017-07-25 DIAGNOSIS — D090 Carcinoma in situ of bladder: Secondary | ICD-10-CM | POA: Diagnosis not present

## 2017-08-21 DIAGNOSIS — H401131 Primary open-angle glaucoma, bilateral, mild stage: Secondary | ICD-10-CM | POA: Diagnosis not present

## 2017-09-21 DIAGNOSIS — Z87891 Personal history of nicotine dependence: Secondary | ICD-10-CM | POA: Diagnosis not present

## 2017-09-21 DIAGNOSIS — H903 Sensorineural hearing loss, bilateral: Secondary | ICD-10-CM | POA: Diagnosis not present

## 2017-09-21 DIAGNOSIS — I10 Essential (primary) hypertension: Secondary | ICD-10-CM | POA: Diagnosis not present

## 2017-09-21 DIAGNOSIS — H9193 Unspecified hearing loss, bilateral: Secondary | ICD-10-CM | POA: Diagnosis not present

## 2017-09-21 DIAGNOSIS — H6123 Impacted cerumen, bilateral: Secondary | ICD-10-CM | POA: Diagnosis not present

## 2017-09-21 DIAGNOSIS — E119 Type 2 diabetes mellitus without complications: Secondary | ICD-10-CM | POA: Diagnosis not present

## 2017-09-26 DIAGNOSIS — H905 Unspecified sensorineural hearing loss: Secondary | ICD-10-CM | POA: Diagnosis not present

## 2017-10-03 DIAGNOSIS — M1712 Unilateral primary osteoarthritis, left knee: Secondary | ICD-10-CM | POA: Diagnosis not present

## 2017-10-16 DIAGNOSIS — E785 Hyperlipidemia, unspecified: Secondary | ICD-10-CM | POA: Diagnosis not present

## 2017-10-16 DIAGNOSIS — H9193 Unspecified hearing loss, bilateral: Secondary | ICD-10-CM | POA: Diagnosis not present

## 2017-10-16 DIAGNOSIS — Z79899 Other long term (current) drug therapy: Secondary | ICD-10-CM | POA: Diagnosis not present

## 2017-10-16 DIAGNOSIS — E1165 Type 2 diabetes mellitus with hyperglycemia: Secondary | ICD-10-CM | POA: Diagnosis not present

## 2017-10-16 DIAGNOSIS — I480 Paroxysmal atrial fibrillation: Secondary | ICD-10-CM | POA: Diagnosis not present

## 2017-10-16 DIAGNOSIS — F419 Anxiety disorder, unspecified: Secondary | ICD-10-CM | POA: Diagnosis not present

## 2017-10-16 DIAGNOSIS — I10 Essential (primary) hypertension: Secondary | ICD-10-CM | POA: Diagnosis not present

## 2017-11-13 DIAGNOSIS — H905 Unspecified sensorineural hearing loss: Secondary | ICD-10-CM

## 2017-11-13 DIAGNOSIS — Z6829 Body mass index (BMI) 29.0-29.9, adult: Secondary | ICD-10-CM | POA: Diagnosis not present

## 2017-11-13 DIAGNOSIS — H9193 Unspecified hearing loss, bilateral: Secondary | ICD-10-CM | POA: Diagnosis not present

## 2017-11-13 DIAGNOSIS — H6123 Impacted cerumen, bilateral: Secondary | ICD-10-CM | POA: Diagnosis not present

## 2017-11-13 DIAGNOSIS — H903 Sensorineural hearing loss, bilateral: Secondary | ICD-10-CM | POA: Diagnosis not present

## 2017-11-13 HISTORY — DX: Unspecified sensorineural hearing loss: H90.5

## 2017-12-14 DIAGNOSIS — R0609 Other forms of dyspnea: Secondary | ICD-10-CM | POA: Diagnosis not present

## 2017-12-14 DIAGNOSIS — R55 Syncope and collapse: Secondary | ICD-10-CM | POA: Diagnosis not present

## 2017-12-14 DIAGNOSIS — I10 Essential (primary) hypertension: Secondary | ICD-10-CM | POA: Diagnosis not present

## 2017-12-14 DIAGNOSIS — I480 Paroxysmal atrial fibrillation: Secondary | ICD-10-CM | POA: Diagnosis not present

## 2017-12-14 DIAGNOSIS — Z87891 Personal history of nicotine dependence: Secondary | ICD-10-CM | POA: Diagnosis not present

## 2017-12-14 DIAGNOSIS — Z8551 Personal history of malignant neoplasm of bladder: Secondary | ICD-10-CM | POA: Diagnosis not present

## 2017-12-14 DIAGNOSIS — I440 Atrioventricular block, first degree: Secondary | ICD-10-CM | POA: Insufficient documentation

## 2017-12-14 HISTORY — DX: Atrioventricular block, first degree: I44.0

## 2017-12-15 ENCOUNTER — Other Ambulatory Visit: Payer: Self-pay

## 2017-12-15 DIAGNOSIS — I493 Ventricular premature depolarization: Secondary | ICD-10-CM | POA: Diagnosis not present

## 2017-12-15 DIAGNOSIS — I440 Atrioventricular block, first degree: Secondary | ICD-10-CM | POA: Diagnosis not present

## 2017-12-20 DIAGNOSIS — Z6830 Body mass index (BMI) 30.0-30.9, adult: Secondary | ICD-10-CM | POA: Diagnosis not present

## 2017-12-20 DIAGNOSIS — I10 Essential (primary) hypertension: Secondary | ICD-10-CM | POA: Diagnosis not present

## 2017-12-20 DIAGNOSIS — I480 Paroxysmal atrial fibrillation: Secondary | ICD-10-CM | POA: Diagnosis not present

## 2017-12-21 DIAGNOSIS — H401131 Primary open-angle glaucoma, bilateral, mild stage: Secondary | ICD-10-CM | POA: Diagnosis not present

## 2018-01-04 DIAGNOSIS — M1712 Unilateral primary osteoarthritis, left knee: Secondary | ICD-10-CM | POA: Diagnosis not present

## 2018-01-05 DIAGNOSIS — H401131 Primary open-angle glaucoma, bilateral, mild stage: Secondary | ICD-10-CM | POA: Diagnosis not present

## 2018-01-12 DIAGNOSIS — H401131 Primary open-angle glaucoma, bilateral, mild stage: Secondary | ICD-10-CM | POA: Diagnosis not present

## 2018-01-17 DIAGNOSIS — F329 Major depressive disorder, single episode, unspecified: Secondary | ICD-10-CM | POA: Diagnosis not present

## 2018-01-17 DIAGNOSIS — H905 Unspecified sensorineural hearing loss: Secondary | ICD-10-CM | POA: Diagnosis not present

## 2018-01-17 DIAGNOSIS — I480 Paroxysmal atrial fibrillation: Secondary | ICD-10-CM | POA: Diagnosis not present

## 2018-01-17 DIAGNOSIS — Z87891 Personal history of nicotine dependence: Secondary | ICD-10-CM | POA: Diagnosis not present

## 2018-01-17 DIAGNOSIS — H903 Sensorineural hearing loss, bilateral: Secondary | ICD-10-CM | POA: Diagnosis not present

## 2018-01-17 DIAGNOSIS — E119 Type 2 diabetes mellitus without complications: Secondary | ICD-10-CM | POA: Diagnosis not present

## 2018-01-17 DIAGNOSIS — Z794 Long term (current) use of insulin: Secondary | ICD-10-CM | POA: Diagnosis not present

## 2018-01-17 DIAGNOSIS — Z9621 Cochlear implant status: Secondary | ICD-10-CM | POA: Diagnosis not present

## 2018-01-17 DIAGNOSIS — Z7901 Long term (current) use of anticoagulants: Secondary | ICD-10-CM | POA: Diagnosis not present

## 2018-01-17 DIAGNOSIS — I10 Essential (primary) hypertension: Secondary | ICD-10-CM | POA: Diagnosis not present

## 2018-01-26 DIAGNOSIS — H401131 Primary open-angle glaucoma, bilateral, mild stage: Secondary | ICD-10-CM | POA: Diagnosis not present

## 2018-02-15 DIAGNOSIS — Z45321 Encounter for adjustment and management of cochlear device: Secondary | ICD-10-CM | POA: Diagnosis not present

## 2018-02-15 DIAGNOSIS — H903 Sensorineural hearing loss, bilateral: Secondary | ICD-10-CM | POA: Diagnosis not present

## 2018-02-15 DIAGNOSIS — H6120 Impacted cerumen, unspecified ear: Secondary | ICD-10-CM | POA: Diagnosis not present

## 2018-02-19 DIAGNOSIS — E1165 Type 2 diabetes mellitus with hyperglycemia: Secondary | ICD-10-CM | POA: Diagnosis not present

## 2018-02-19 DIAGNOSIS — Z0001 Encounter for general adult medical examination with abnormal findings: Secondary | ICD-10-CM | POA: Diagnosis not present

## 2018-02-19 DIAGNOSIS — E785 Hyperlipidemia, unspecified: Secondary | ICD-10-CM | POA: Diagnosis not present

## 2018-02-19 DIAGNOSIS — I480 Paroxysmal atrial fibrillation: Secondary | ICD-10-CM | POA: Diagnosis not present

## 2018-02-19 DIAGNOSIS — Z794 Long term (current) use of insulin: Secondary | ICD-10-CM | POA: Diagnosis not present

## 2018-02-19 DIAGNOSIS — E1140 Type 2 diabetes mellitus with diabetic neuropathy, unspecified: Secondary | ICD-10-CM | POA: Diagnosis not present

## 2018-02-19 DIAGNOSIS — I10 Essential (primary) hypertension: Secondary | ICD-10-CM | POA: Diagnosis not present

## 2018-02-19 DIAGNOSIS — Z1331 Encounter for screening for depression: Secondary | ICD-10-CM | POA: Diagnosis not present

## 2018-02-19 DIAGNOSIS — Z79899 Other long term (current) drug therapy: Secondary | ICD-10-CM | POA: Diagnosis not present

## 2018-02-19 DIAGNOSIS — Z6829 Body mass index (BMI) 29.0-29.9, adult: Secondary | ICD-10-CM | POA: Diagnosis not present

## 2018-02-19 DIAGNOSIS — Z1339 Encounter for screening examination for other mental health and behavioral disorders: Secondary | ICD-10-CM | POA: Diagnosis not present

## 2018-02-19 DIAGNOSIS — E663 Overweight: Secondary | ICD-10-CM | POA: Diagnosis not present

## 2018-02-19 DIAGNOSIS — R42 Dizziness and giddiness: Secondary | ICD-10-CM | POA: Diagnosis not present

## 2018-03-01 DIAGNOSIS — Z23 Encounter for immunization: Secondary | ICD-10-CM | POA: Diagnosis not present

## 2018-03-30 DIAGNOSIS — H903 Sensorineural hearing loss, bilateral: Secondary | ICD-10-CM | POA: Diagnosis not present

## 2018-03-30 DIAGNOSIS — Z45321 Encounter for adjustment and management of cochlear device: Secondary | ICD-10-CM | POA: Diagnosis not present

## 2018-05-01 DIAGNOSIS — C61 Malignant neoplasm of prostate: Secondary | ICD-10-CM | POA: Diagnosis not present

## 2018-05-28 DIAGNOSIS — H903 Sensorineural hearing loss, bilateral: Secondary | ICD-10-CM | POA: Diagnosis not present

## 2018-05-28 DIAGNOSIS — Z45321 Encounter for adjustment and management of cochlear device: Secondary | ICD-10-CM | POA: Diagnosis not present

## 2018-05-31 DIAGNOSIS — H401131 Primary open-angle glaucoma, bilateral, mild stage: Secondary | ICD-10-CM | POA: Diagnosis not present

## 2018-06-12 DIAGNOSIS — H401131 Primary open-angle glaucoma, bilateral, mild stage: Secondary | ICD-10-CM | POA: Diagnosis not present

## 2018-06-18 DIAGNOSIS — Z79899 Other long term (current) drug therapy: Secondary | ICD-10-CM | POA: Diagnosis not present

## 2018-06-18 DIAGNOSIS — E1165 Type 2 diabetes mellitus with hyperglycemia: Secondary | ICD-10-CM | POA: Diagnosis not present

## 2018-06-18 DIAGNOSIS — F419 Anxiety disorder, unspecified: Secondary | ICD-10-CM | POA: Diagnosis not present

## 2018-06-18 DIAGNOSIS — Z6830 Body mass index (BMI) 30.0-30.9, adult: Secondary | ICD-10-CM | POA: Diagnosis not present

## 2018-06-18 DIAGNOSIS — I10 Essential (primary) hypertension: Secondary | ICD-10-CM | POA: Diagnosis not present

## 2018-06-18 DIAGNOSIS — I480 Paroxysmal atrial fibrillation: Secondary | ICD-10-CM | POA: Diagnosis not present

## 2018-06-18 DIAGNOSIS — R0981 Nasal congestion: Secondary | ICD-10-CM | POA: Diagnosis not present

## 2018-06-21 DIAGNOSIS — R54 Age-related physical debility: Secondary | ICD-10-CM

## 2018-06-21 DIAGNOSIS — Z7901 Long term (current) use of anticoagulants: Secondary | ICD-10-CM | POA: Diagnosis not present

## 2018-06-21 DIAGNOSIS — Z87891 Personal history of nicotine dependence: Secondary | ICD-10-CM | POA: Diagnosis not present

## 2018-06-21 DIAGNOSIS — I440 Atrioventricular block, first degree: Secondary | ICD-10-CM | POA: Diagnosis not present

## 2018-06-21 DIAGNOSIS — I10 Essential (primary) hypertension: Secondary | ICD-10-CM | POA: Diagnosis not present

## 2018-06-21 DIAGNOSIS — I480 Paroxysmal atrial fibrillation: Secondary | ICD-10-CM | POA: Diagnosis not present

## 2018-06-21 DIAGNOSIS — Z8551 Personal history of malignant neoplasm of bladder: Secondary | ICD-10-CM | POA: Diagnosis not present

## 2018-06-21 HISTORY — DX: Age-related physical debility: R54

## 2018-06-22 DIAGNOSIS — H401131 Primary open-angle glaucoma, bilateral, mild stage: Secondary | ICD-10-CM | POA: Diagnosis not present

## 2018-07-20 DIAGNOSIS — H401131 Primary open-angle glaucoma, bilateral, mild stage: Secondary | ICD-10-CM | POA: Diagnosis not present

## 2018-09-14 DIAGNOSIS — H903 Sensorineural hearing loss, bilateral: Secondary | ICD-10-CM | POA: Diagnosis not present

## 2018-09-14 DIAGNOSIS — Z45321 Encounter for adjustment and management of cochlear device: Secondary | ICD-10-CM | POA: Diagnosis not present

## 2018-09-24 DIAGNOSIS — H903 Sensorineural hearing loss, bilateral: Secondary | ICD-10-CM | POA: Diagnosis not present

## 2018-10-15 DIAGNOSIS — H905 Unspecified sensorineural hearing loss: Secondary | ICD-10-CM | POA: Diagnosis not present

## 2018-10-17 DIAGNOSIS — Z79899 Other long term (current) drug therapy: Secondary | ICD-10-CM | POA: Diagnosis not present

## 2018-10-17 DIAGNOSIS — Z6829 Body mass index (BMI) 29.0-29.9, adult: Secondary | ICD-10-CM | POA: Diagnosis not present

## 2018-10-17 DIAGNOSIS — I480 Paroxysmal atrial fibrillation: Secondary | ICD-10-CM | POA: Diagnosis not present

## 2018-10-17 DIAGNOSIS — I10 Essential (primary) hypertension: Secondary | ICD-10-CM | POA: Diagnosis not present

## 2018-10-17 DIAGNOSIS — E1165 Type 2 diabetes mellitus with hyperglycemia: Secondary | ICD-10-CM | POA: Diagnosis not present

## 2018-11-07 DIAGNOSIS — Z6829 Body mass index (BMI) 29.0-29.9, adult: Secondary | ICD-10-CM | POA: Diagnosis not present

## 2018-11-07 DIAGNOSIS — H6123 Impacted cerumen, bilateral: Secondary | ICD-10-CM | POA: Diagnosis not present

## 2018-11-07 DIAGNOSIS — H905 Unspecified sensorineural hearing loss: Secondary | ICD-10-CM | POA: Diagnosis not present

## 2018-11-27 DIAGNOSIS — Z79899 Other long term (current) drug therapy: Secondary | ICD-10-CM | POA: Diagnosis not present

## 2018-11-27 DIAGNOSIS — D649 Anemia, unspecified: Secondary | ICD-10-CM | POA: Diagnosis not present

## 2018-11-27 DIAGNOSIS — R7989 Other specified abnormal findings of blood chemistry: Secondary | ICD-10-CM | POA: Diagnosis not present

## 2018-11-30 DIAGNOSIS — H401131 Primary open-angle glaucoma, bilateral, mild stage: Secondary | ICD-10-CM | POA: Diagnosis not present

## 2019-01-10 DIAGNOSIS — H903 Sensorineural hearing loss, bilateral: Secondary | ICD-10-CM | POA: Diagnosis not present

## 2019-01-25 DIAGNOSIS — Z1211 Encounter for screening for malignant neoplasm of colon: Secondary | ICD-10-CM | POA: Diagnosis not present

## 2019-02-19 DIAGNOSIS — Z6829 Body mass index (BMI) 29.0-29.9, adult: Secondary | ICD-10-CM | POA: Diagnosis not present

## 2019-02-19 DIAGNOSIS — R42 Dizziness and giddiness: Secondary | ICD-10-CM | POA: Diagnosis not present

## 2019-03-12 DIAGNOSIS — Z6829 Body mass index (BMI) 29.0-29.9, adult: Secondary | ICD-10-CM | POA: Diagnosis not present

## 2019-03-12 DIAGNOSIS — R42 Dizziness and giddiness: Secondary | ICD-10-CM | POA: Diagnosis not present

## 2019-03-22 DIAGNOSIS — R42 Dizziness and giddiness: Secondary | ICD-10-CM | POA: Diagnosis not present

## 2019-03-29 DIAGNOSIS — Z23 Encounter for immunization: Secondary | ICD-10-CM | POA: Diagnosis not present

## 2019-04-02 DIAGNOSIS — H903 Sensorineural hearing loss, bilateral: Secondary | ICD-10-CM | POA: Diagnosis not present

## 2019-04-05 DIAGNOSIS — H401131 Primary open-angle glaucoma, bilateral, mild stage: Secondary | ICD-10-CM | POA: Diagnosis not present

## 2019-04-10 ENCOUNTER — Other Ambulatory Visit: Payer: Self-pay

## 2019-04-18 DIAGNOSIS — H61303 Acquired stenosis of external ear canal, unspecified, bilateral: Secondary | ICD-10-CM | POA: Diagnosis not present

## 2019-04-18 DIAGNOSIS — H6121 Impacted cerumen, right ear: Secondary | ICD-10-CM | POA: Diagnosis not present

## 2019-04-18 DIAGNOSIS — H8110 Benign paroxysmal vertigo, unspecified ear: Secondary | ICD-10-CM | POA: Diagnosis not present

## 2019-04-19 DIAGNOSIS — I480 Paroxysmal atrial fibrillation: Secondary | ICD-10-CM | POA: Diagnosis not present

## 2019-04-19 DIAGNOSIS — Z7901 Long term (current) use of anticoagulants: Secondary | ICD-10-CM

## 2019-04-19 DIAGNOSIS — Z87891 Personal history of nicotine dependence: Secondary | ICD-10-CM | POA: Diagnosis not present

## 2019-04-19 DIAGNOSIS — R54 Age-related physical debility: Secondary | ICD-10-CM | POA: Diagnosis not present

## 2019-04-19 DIAGNOSIS — I10 Essential (primary) hypertension: Secondary | ICD-10-CM | POA: Diagnosis not present

## 2019-04-19 HISTORY — DX: Long term (current) use of anticoagulants: Z79.01

## 2019-04-23 DIAGNOSIS — I10 Essential (primary) hypertension: Secondary | ICD-10-CM | POA: Diagnosis not present

## 2019-04-23 DIAGNOSIS — Z79899 Other long term (current) drug therapy: Secondary | ICD-10-CM | POA: Diagnosis not present

## 2019-04-23 DIAGNOSIS — E785 Hyperlipidemia, unspecified: Secondary | ICD-10-CM | POA: Diagnosis not present

## 2019-04-23 DIAGNOSIS — E1165 Type 2 diabetes mellitus with hyperglycemia: Secondary | ICD-10-CM | POA: Diagnosis not present

## 2019-04-23 DIAGNOSIS — E611 Iron deficiency: Secondary | ICD-10-CM | POA: Diagnosis not present

## 2019-05-22 DIAGNOSIS — D090 Carcinoma in situ of bladder: Secondary | ICD-10-CM | POA: Diagnosis not present

## 2019-06-10 DIAGNOSIS — C61 Malignant neoplasm of prostate: Secondary | ICD-10-CM | POA: Diagnosis not present

## 2019-06-10 DIAGNOSIS — C679 Malignant neoplasm of bladder, unspecified: Secondary | ICD-10-CM | POA: Diagnosis not present

## 2019-06-14 DIAGNOSIS — N390 Urinary tract infection, site not specified: Secondary | ICD-10-CM | POA: Diagnosis not present

## 2019-06-14 DIAGNOSIS — R339 Retention of urine, unspecified: Secondary | ICD-10-CM | POA: Diagnosis not present

## 2019-06-17 DIAGNOSIS — R339 Retention of urine, unspecified: Secondary | ICD-10-CM | POA: Diagnosis not present

## 2019-06-17 DIAGNOSIS — C61 Malignant neoplasm of prostate: Secondary | ICD-10-CM | POA: Diagnosis not present

## 2019-06-17 DIAGNOSIS — C679 Malignant neoplasm of bladder, unspecified: Secondary | ICD-10-CM | POA: Diagnosis not present

## 2019-06-17 DIAGNOSIS — R3915 Urgency of urination: Secondary | ICD-10-CM | POA: Diagnosis not present

## 2019-06-17 DIAGNOSIS — N3000 Acute cystitis without hematuria: Secondary | ICD-10-CM | POA: Diagnosis not present

## 2019-06-27 DIAGNOSIS — H903 Sensorineural hearing loss, bilateral: Secondary | ICD-10-CM | POA: Diagnosis not present

## 2019-06-30 DIAGNOSIS — U071 COVID-19: Secondary | ICD-10-CM | POA: Diagnosis not present

## 2019-06-30 DIAGNOSIS — I10 Essential (primary) hypertension: Secondary | ICD-10-CM | POA: Diagnosis not present

## 2019-06-30 DIAGNOSIS — R0602 Shortness of breath: Secondary | ICD-10-CM | POA: Diagnosis not present

## 2019-06-30 DIAGNOSIS — J1282 Pneumonia due to coronavirus disease 2019: Secondary | ICD-10-CM | POA: Diagnosis not present

## 2019-06-30 DIAGNOSIS — J168 Pneumonia due to other specified infectious organisms: Secondary | ICD-10-CM | POA: Diagnosis not present

## 2019-06-30 DIAGNOSIS — E7800 Pure hypercholesterolemia, unspecified: Secondary | ICD-10-CM | POA: Diagnosis not present

## 2019-07-01 ENCOUNTER — Inpatient Hospital Stay (HOSPITAL_COMMUNITY): Payer: Medicare Other

## 2019-07-01 ENCOUNTER — Encounter (HOSPITAL_COMMUNITY): Payer: Self-pay | Admitting: Internal Medicine

## 2019-07-01 ENCOUNTER — Inpatient Hospital Stay (HOSPITAL_COMMUNITY)
Admission: AD | Admit: 2019-07-01 | Discharge: 2019-07-06 | DRG: 177 | Disposition: A | Discharge status: Home Health | Payer: Medicare Other | Source: Other Acute Inpatient Hospital | Attending: Internal Medicine | Admitting: Internal Medicine

## 2019-07-01 DIAGNOSIS — Z79899 Other long term (current) drug therapy: Secondary | ICD-10-CM | POA: Diagnosis not present

## 2019-07-01 DIAGNOSIS — C61 Malignant neoplasm of prostate: Secondary | ICD-10-CM | POA: Diagnosis present

## 2019-07-01 DIAGNOSIS — Z794 Long term (current) use of insulin: Secondary | ICD-10-CM | POA: Diagnosis not present

## 2019-07-01 DIAGNOSIS — I480 Paroxysmal atrial fibrillation: Secondary | ICD-10-CM | POA: Diagnosis present

## 2019-07-01 DIAGNOSIS — E1169 Type 2 diabetes mellitus with other specified complication: Secondary | ICD-10-CM | POA: Diagnosis not present

## 2019-07-01 DIAGNOSIS — F419 Anxiety disorder, unspecified: Secondary | ICD-10-CM | POA: Diagnosis present

## 2019-07-01 DIAGNOSIS — R262 Difficulty in walking, not elsewhere classified: Secondary | ICD-10-CM | POA: Diagnosis present

## 2019-07-01 DIAGNOSIS — I10 Essential (primary) hypertension: Secondary | ICD-10-CM | POA: Diagnosis not present

## 2019-07-01 DIAGNOSIS — Z7982 Long term (current) use of aspirin: Secondary | ICD-10-CM | POA: Diagnosis not present

## 2019-07-01 DIAGNOSIS — T380X5A Adverse effect of glucocorticoids and synthetic analogues, initial encounter: Secondary | ICD-10-CM | POA: Diagnosis not present

## 2019-07-01 DIAGNOSIS — N400 Enlarged prostate without lower urinary tract symptoms: Secondary | ICD-10-CM | POA: Diagnosis present

## 2019-07-01 DIAGNOSIS — J1282 Pneumonia due to coronavirus disease 2019: Secondary | ICD-10-CM | POA: Diagnosis present

## 2019-07-01 DIAGNOSIS — U071 COVID-19: Principal | ICD-10-CM | POA: Diagnosis present

## 2019-07-01 DIAGNOSIS — R0600 Dyspnea, unspecified: Secondary | ICD-10-CM

## 2019-07-01 DIAGNOSIS — Z8546 Personal history of malignant neoplasm of prostate: Secondary | ICD-10-CM

## 2019-07-01 DIAGNOSIS — F329 Major depressive disorder, single episode, unspecified: Secondary | ICD-10-CM | POA: Diagnosis present

## 2019-07-01 DIAGNOSIS — IMO0002 Reserved for concepts with insufficient information to code with codable children: Secondary | ICD-10-CM | POA: Diagnosis present

## 2019-07-01 DIAGNOSIS — J9601 Acute respiratory failure with hypoxia: Secondary | ICD-10-CM | POA: Diagnosis present

## 2019-07-01 DIAGNOSIS — Z8551 Personal history of malignant neoplasm of bladder: Secondary | ICD-10-CM | POA: Diagnosis not present

## 2019-07-01 DIAGNOSIS — E1165 Type 2 diabetes mellitus with hyperglycemia: Secondary | ICD-10-CM | POA: Diagnosis present

## 2019-07-01 DIAGNOSIS — Z87891 Personal history of nicotine dependence: Secondary | ICD-10-CM | POA: Diagnosis not present

## 2019-07-01 DIAGNOSIS — H9190 Unspecified hearing loss, unspecified ear: Secondary | ICD-10-CM | POA: Insufficient documentation

## 2019-07-01 DIAGNOSIS — E118 Type 2 diabetes mellitus with unspecified complications: Secondary | ICD-10-CM | POA: Diagnosis not present

## 2019-07-01 DIAGNOSIS — N1831 Chronic kidney disease, stage 3a: Secondary | ICD-10-CM | POA: Diagnosis not present

## 2019-07-01 DIAGNOSIS — N183 Chronic kidney disease, stage 3 unspecified: Secondary | ICD-10-CM | POA: Diagnosis present

## 2019-07-01 DIAGNOSIS — E785 Hyperlipidemia, unspecified: Secondary | ICD-10-CM | POA: Diagnosis present

## 2019-07-01 DIAGNOSIS — E7800 Pure hypercholesterolemia, unspecified: Secondary | ICD-10-CM | POA: Diagnosis not present

## 2019-07-01 DIAGNOSIS — E119 Type 2 diabetes mellitus without complications: Secondary | ICD-10-CM

## 2019-07-01 HISTORY — DX: COVID-19: U07.1

## 2019-07-01 HISTORY — DX: Type 2 diabetes mellitus without complications: E11.9

## 2019-07-01 HISTORY — DX: Hyperlipidemia, unspecified: E78.5

## 2019-07-01 HISTORY — DX: Pneumonia due to coronavirus disease 2019: J12.82

## 2019-07-01 LAB — COMPREHENSIVE METABOLIC PANEL WITH GFR
ALT: 32 U/L (ref 0–44)
AST: 54 U/L — ABNORMAL HIGH (ref 15–41)
Albumin: 3.3 g/dL — ABNORMAL LOW (ref 3.5–5.0)
Alkaline Phosphatase: 61 U/L (ref 38–126)
Anion gap: 13 (ref 5–15)
BUN: 38 mg/dL — ABNORMAL HIGH (ref 8–23)
CO2: 21 mmol/L — ABNORMAL LOW (ref 22–32)
Calcium: 9.1 mg/dL (ref 8.9–10.3)
Chloride: 101 mmol/L (ref 98–111)
Creatinine, Ser: 1.71 mg/dL — ABNORMAL HIGH (ref 0.61–1.24)
GFR calc Af Amer: 40 mL/min — ABNORMAL LOW
GFR calc non Af Amer: 34 mL/min — ABNORMAL LOW
Glucose, Bld: 327 mg/dL — ABNORMAL HIGH (ref 70–99)
Potassium: 4.5 mmol/L (ref 3.5–5.1)
Sodium: 135 mmol/L (ref 135–145)
Total Bilirubin: 0.8 mg/dL (ref 0.3–1.2)
Total Protein: 6.8 g/dL (ref 6.5–8.1)

## 2019-07-01 LAB — CBC WITH DIFFERENTIAL/PLATELET
Abs Immature Granulocytes: 0.01 10*3/uL (ref 0.00–0.07)
Basophils Absolute: 0 10*3/uL (ref 0.0–0.1)
Basophils Relative: 0 %
Eosinophils Absolute: 0 10*3/uL (ref 0.0–0.5)
Eosinophils Relative: 0 %
HCT: 35.5 % — ABNORMAL LOW (ref 39.0–52.0)
Hemoglobin: 12.2 g/dL — ABNORMAL LOW (ref 13.0–17.0)
Immature Granulocytes: 0 %
Lymphocytes Relative: 27 %
Lymphs Abs: 1.3 10*3/uL (ref 0.7–4.0)
MCH: 28.8 pg (ref 26.0–34.0)
MCHC: 34.4 g/dL (ref 30.0–36.0)
MCV: 83.9 fL (ref 80.0–100.0)
Monocytes Absolute: 0.5 10*3/uL (ref 0.1–1.0)
Monocytes Relative: 9 %
Neutro Abs: 3.2 10*3/uL (ref 1.7–7.7)
Neutrophils Relative %: 64 %
Platelets: 226 10*3/uL (ref 150–400)
RBC: 4.23 MIL/uL (ref 4.22–5.81)
RDW: 16.2 % — ABNORMAL HIGH (ref 11.5–15.5)
WBC: 5 10*3/uL (ref 4.0–10.5)
nRBC: 0 % (ref 0.0–0.2)

## 2019-07-01 LAB — C-REACTIVE PROTEIN: CRP: 2.8 mg/dL — ABNORMAL HIGH (ref <1.0)

## 2019-07-01 LAB — GLUCOSE, CAPILLARY
Glucose-Capillary: 311 mg/dL — ABNORMAL HIGH (ref 70–99)
Glucose-Capillary: 232 mg/dL — ABNORMAL HIGH (ref 70–99)

## 2019-07-01 LAB — FERRITIN: Ferritin: 47 ng/mL (ref 24–336)

## 2019-07-01 LAB — ABO/RH: ABO/RH(D): A NEG

## 2019-07-01 LAB — D-DIMER, QUANTITATIVE: D-Dimer, Quant: 0.63 ug/mL-FEU — ABNORMAL HIGH (ref 0.00–0.50)

## 2019-07-01 IMAGING — DX DG CHEST 1V
1 series · 1 of 1 positions shown · non-contrast
Comparison: [DATE]

CLINICAL DATA: Dyspnea

EXAM:
CHEST  1 VIEW

[chest]
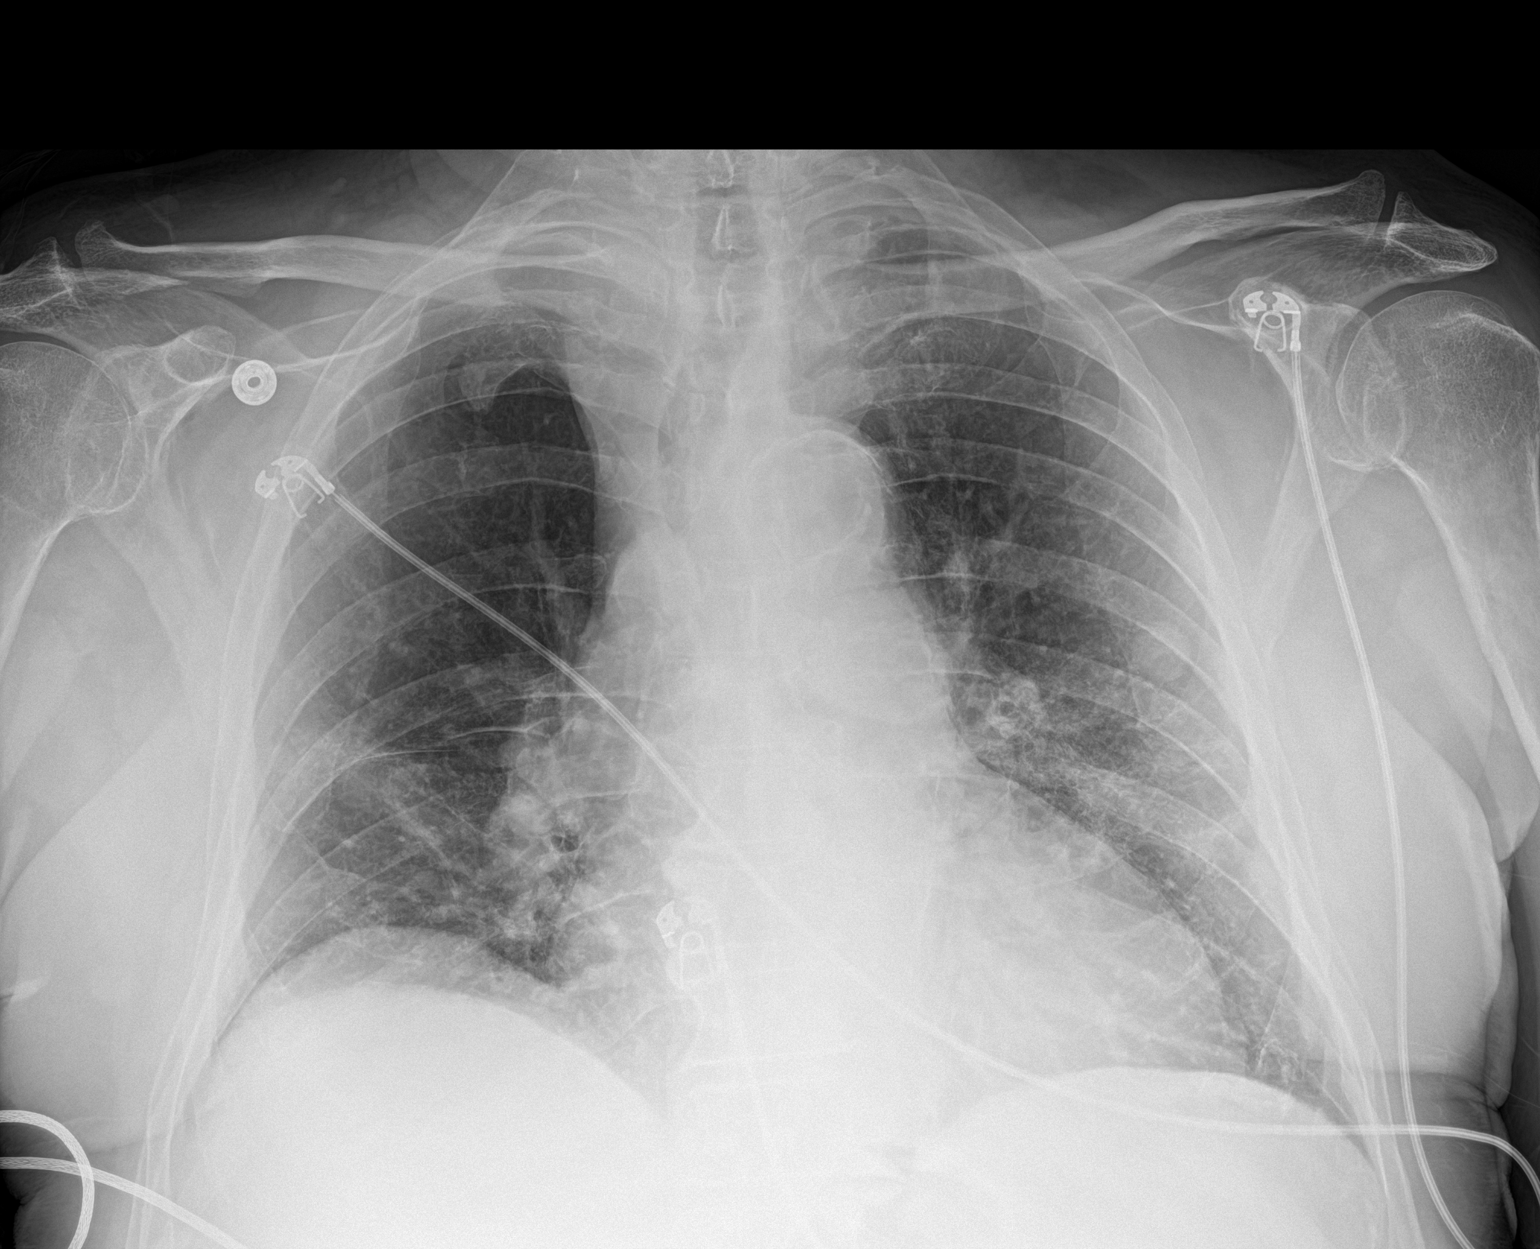

[1 of 1 positions shown; findings below may reference images not displayed]

FINDINGS: Single frontal view of the chest demonstrates a stable cardiac
silhouette. Atherosclerosis of the aorta unchanged. Chronic scarring
seen at the lung bases. No acute airspace disease, effusion, or
pneumothorax. 1.6 cm calcified nodule seen overlying the left mid
chest unchanged since numerous prior exams.
IMPRESSION: 1. Stable exam, no acute process.

## 2019-07-01 MED ORDER — APIXABAN 5 MG PO TABS
5.0000 mg | ORAL_TABLET | Freq: Two times a day (BID) | ORAL | Status: DC
  Administered 2019-07-01 – 2019-07-06 (×10): 5 mg via ORAL
  Filled 2019-07-01 (×10): qty 1

## 2019-07-01 MED ORDER — ATENOLOL 25 MG PO TABS: 25.0000 mg | ORAL_TABLET | Freq: Every day | ORAL | Status: DC

## 2019-07-01 MED ORDER — ALBUTEROL SULFATE HFA 108 (90 BASE) MCG/ACT IN AERS
1.0000 | INHALATION_SPRAY | RESPIRATORY_TRACT | Status: DC | PRN
  Administered 2019-07-01: 19:00:00 1 via RESPIRATORY_TRACT
  Filled 2019-07-01: qty 6.7

## 2019-07-01 MED ORDER — INSULIN DETEMIR 100 UNIT/ML ~~LOC~~ SOLN
10.0000 [IU] | Freq: Every day | SUBCUTANEOUS | Status: DC
  Administered 2019-07-01: 23:00:00 10 [IU] via SUBCUTANEOUS
  Filled 2019-07-01 (×2): qty 0.1

## 2019-07-01 MED ORDER — MIRABEGRON ER 50 MG PO TB24
50.0000 mg | ORAL_TABLET | Freq: Every day | ORAL | Status: DC
  Administered 2019-07-01 – 2019-07-05 (×5): 50 mg via ORAL
  Filled 2019-07-01 (×6): qty 1

## 2019-07-01 MED ORDER — HYDROCOD POLST-CPM POLST ER 10-8 MG/5ML PO SUER
5.0000 mL | Freq: Two times a day (BID) | ORAL | Status: DC
  Administered 2019-07-01 – 2019-07-06 (×10): 5 mL via ORAL
  Filled 2019-07-01 (×10): qty 5

## 2019-07-01 MED ORDER — ONDANSETRON HCL 4 MG PO TABS: 4.0000 mg | ORAL_TABLET | Freq: Four times a day (QID) | ORAL | Status: DC | PRN

## 2019-07-01 MED ORDER — ASCORBIC ACID 500 MG PO TABS
500.0000 mg | ORAL_TABLET | Freq: Every day | ORAL | Status: DC
  Administered 2019-07-01 – 2019-07-06 (×6): 500 mg via ORAL
  Filled 2019-07-01 (×6): qty 1

## 2019-07-01 MED ORDER — ACETAMINOPHEN 325 MG PO TABS
650.0000 mg | ORAL_TABLET | Freq: Four times a day (QID) | ORAL | Status: DC | PRN
  Administered 2019-07-03: 650 mg via ORAL
  Filled 2019-07-01: qty 2

## 2019-07-01 MED ORDER — FINASTERIDE 5 MG PO TABS
5.0000 mg | ORAL_TABLET | Freq: Every day | ORAL | Status: DC
  Administered 2019-07-01 – 2019-07-06 (×6): 5 mg via ORAL
  Filled 2019-07-01 (×6): qty 1

## 2019-07-01 MED ORDER — GUAIFENESIN-DM 100-10 MG/5ML PO SYRP: 10.0000 mL | ORAL_SOLUTION | ORAL | Status: DC | PRN

## 2019-07-01 MED ORDER — ONDANSETRON HCL 4 MG/2ML IJ SOLN: 4.0000 mg | Freq: Four times a day (QID) | INTRAMUSCULAR | Status: DC | PRN

## 2019-07-01 MED ORDER — SIMVASTATIN 20 MG PO TABS: 40.0000 mg | ORAL_TABLET | Freq: Every day | ORAL | Status: DC

## 2019-07-01 MED ORDER — AMLODIPINE BESYLATE 5 MG PO TABS
5.0000 mg | ORAL_TABLET | Freq: Every day | ORAL | Status: DC
  Administered 2019-07-01 – 2019-07-06 (×6): 5 mg via ORAL
  Filled 2019-07-01 (×6): qty 1

## 2019-07-01 MED ORDER — ASPIRIN 81 MG PO TBEC
81.0000 mg | DELAYED_RELEASE_TABLET | Freq: Every day | ORAL | Status: DC
  Administered 2019-07-01 – 2019-07-06 (×6): 81 mg via ORAL
  Filled 2019-07-01 (×11): qty 1

## 2019-07-01 MED ORDER — ENOXAPARIN SODIUM 40 MG/0.4ML ~~LOC~~ SOLN: 40.0000 mg | SUBCUTANEOUS | Status: DC

## 2019-07-01 MED ORDER — ACETAMINOPHEN 650 MG RE SUPP: 650.0000 mg | Freq: Four times a day (QID) | RECTAL | Status: DC | PRN

## 2019-07-01 MED ORDER — INSULIN ASPART 100 UNIT/ML ~~LOC~~ SOLN
0.0000 [IU] | Freq: Three times a day (TID) | SUBCUTANEOUS | Status: DC
  Administered 2019-07-01: 7 [IU] via SUBCUTANEOUS
  Administered 2019-07-02: 12:00:00 5 [IU] via SUBCUTANEOUS
  Administered 2019-07-02: 9 [IU] via SUBCUTANEOUS

## 2019-07-01 MED ORDER — ZINC SULFATE 220 (50 ZN) MG PO CAPS
220.0000 mg | ORAL_CAPSULE | Freq: Every day | ORAL | Status: DC
  Administered 2019-07-01 – 2019-07-06 (×6): 220 mg via ORAL
  Filled 2019-07-01 (×6): qty 1

## 2019-07-01 MED ORDER — SODIUM CHLORIDE 0.9 % IV SOLN: 200.0000 mg | Freq: Once | INTRAVENOUS | Status: DC

## 2019-07-01 MED ORDER — PAROXETINE HCL 10 MG PO TABS: 10.0000 mg | ORAL_TABLET | ORAL | Status: DC

## 2019-07-01 MED ORDER — SODIUM CHLORIDE 0.9 % IV SOLN: 100.0000 mg | Freq: Every day | INTRAVENOUS | Status: DC

## 2019-07-01 MED ORDER — IPRATROPIUM-ALBUTEROL 20-100 MCG/ACT IN AERS
1.0000 | INHALATION_SPRAY | Freq: Four times a day (QID) | RESPIRATORY_TRACT | Status: DC
  Administered 2019-07-01 – 2019-07-03 (×7): 1 via RESPIRATORY_TRACT
  Filled 2019-07-01: qty 4

## 2019-07-01 MED ORDER — DEXAMETHASONE SODIUM PHOSPHATE 10 MG/ML IJ SOLN
6.0000 mg | INTRAMUSCULAR | Status: DC
  Administered 2019-07-01 – 2019-07-05 (×5): 6 mg via INTRAVENOUS
  Filled 2019-07-01 (×5): qty 1

## 2019-07-01 MED ORDER — PAROXETINE HCL 30 MG PO TABS
30.0000 mg | ORAL_TABLET | Freq: Every day | ORAL | Status: DC
  Administered 2019-07-01 – 2019-07-06 (×6): 30 mg via ORAL
  Filled 2019-07-01 (×6): qty 1

## 2019-07-01 MED ORDER — HYDROCOD POLST-CPM POLST ER 10-8 MG/5ML PO SUER: 5.0000 mL | Freq: Two times a day (BID) | ORAL | Status: DC | PRN

## 2019-07-01 MED ORDER — SODIUM CHLORIDE 0.9 % IV SOLN
100.0000 mg | Freq: Every day | INTRAVENOUS | Status: AC
  Administered 2019-07-02 – 2019-07-05 (×4): 100 mg via INTRAVENOUS
  Filled 2019-07-01 (×4): qty 20

## 2019-07-01 NOTE — Progress Notes (Signed)
Requested [facility] medical records to fax pt Covid positive test over before admission.   Damica Gravlin, RN Fax # [phone] 

## 2019-07-01 NOTE — Progress Notes (Addendum)
Pt just transferred from [facility] for [place]:  He got Remdesivir 200mg  x1 @1502  Dexamethasone 6mg  IV x1 @0237  Scr 1.5 ALT 32 PCT 0.11 PO azith @0647       Onnie Boer, PharmD, [place], AAHIVP, CPP Infectious Disease Pharmacist 07/01/2019 5:26 PM

## 2019-07-01 NOTE — H&P (Addendum)
History and Physical    Evens Soderberg [id number] DOB: 28-Apr-1926 DOA: 07/01/2019  PCP: Mathews Argyle, MD   Patient coming from: [facility]   Chief Complaint: Dyspnea and weakness.   HPI: Lequan Villarroel is a 86 y.o. male with medical history significant of hypertension, dyslipidemia, type 2 diabetes mellitus and prostate cancer.  Patient reported for 4 days of not feeling well, generalized weakness, dry cough, and dyspnea.  His predominant symptom was weakness, no improving factors, worse with exertion, associated with fevers but no chills.  He had difficulty ambulating and poor appetite.  Due to persistent symptoms he presented on February 14 to [facility] emergency department where he was diagnosed with SARS COVID-19 viral pneumonia.  His oximetry was 92% on room air and on ambulation 87%.  He underwent further work-up with CT chest which was negative for pulmonary embolus, his white count was 7.1, hemoglobin 13.2, sodium 135, potassium 4.5, chloride 98, bicarb 25, BUN 20, creatinine 1.50, glucose 275.    On February 15 he was transferred to [facility] for evaluation.  At the time of transfer he has dyspnea which is moderate in intensity, worse with exertion, no improving factors.  He continued to feel weak but not frank fatigue.  Patient lives by himself, no known sick contacts.  Ambulates independently.    Review of Systems:  1. General: positive fevers and chills, but no weight gain or weight loss 2. ENT: No runny nose or sore throat, no hearing disturbances 3. Pulmonary: positive dyspnea and dry cough, no wheezing, or hemoptysis 4. Cardiovascular: No angina, claudication, lower extremity edema, pnd or orthopnea 5. Gastrointestinal: No nausea or vomiting, no diarrhea or constipation 6. Hematology: No easy bruisability or frequent infections 7. Urology: No dysuria, hematuria or increased urinary frequency 8. Dermatology: No rashes. 9. Neurology:  No seizures or paresthesias 10. Musculoskeletal: No joint pain or deformities  Past Medical History:  Diagnosis Date  . Anxiety   . Bladder cancer   . BPH (benign prostatic hyperplasia)   . Cerebral ischemia   . Depressive disorder   . Diabetes mellitus   . Hyperlipidemia   . Hypertension   . Plantar fasciitis     Past Surgical History:  Procedure Laterality Date  . EYE SURGERY    . TONSILLECTOMY       reports that he quit smoking about 31 years ago. He does not have any smokeless tobacco history on file. He reports that he does not drink alcohol or use drugs.  No Known Allergies  No family history on file.   Prior to Admission medications   Medication Sig Start Date End Date Taking? Authorizing Provider  aspirin 81 MG tablet Take 81 mg by mouth daily.      [provider]  atenolol (TENORMIN) 25 MG tablet Take 1 tablet (25 mg total) by mouth daily. 08/31/10   Nahser, Wonda Cheng, MD  finasteride (PROSCAR) 5 MG tablet Take 5 mg by mouth daily.      [provider]  Insulin Isophane Human (HUMULIN N [place]) Inject 30 Units into the skin daily at 6 (six) AM.      [provider]  metFORMIN (GLUCOPHAGE) 1000 MG tablet Take 1,000 mg by mouth daily with breakfast. 1 1/2 tab daily     [provider]  PARoxetine (PAXIL) 10 MG tablet Take 10 mg by mouth every morning.      [provider]  quinapril (ACCUPRIL) 40 MG tablet Take 40 mg by  mouth at bedtime.      [provider]  simvastatin (ZOCOR) 40 MG tablet Take 40 mg by mouth at bedtime.      [provider]    Physical Exam: Vitals:   07/01/19 1700  BP: (!) 161/88  Pulse: 95  Resp: 18  Temp: (!) 96.7 F (35.9 C)  TempSrc: Oral  SpO2: 95%    Vitals:   07/01/19 1700  BP: (!) 161/88  Pulse: 95  Resp: 18  Temp: (!) 96.7 F (35.9 C)  TempSrc: Oral  SpO2: 95%   General: Not in pain or dyspnea, deconditioned  Neurology: Awake and alert, non focal Head and  Neck. Head normocephalic. Neck supple with no adenopathy or thyromegaly.   E ENT: mild pallor, no icterus, oral mucosa moist Cardiovascular: No JVD. S1-S2 present, rhythmic, no gallops, rubs, or murmurs. No lower extremity edema. Pulmonary: positive breath sounds bilaterally, decreased air movement, mild rales at bases but no wheezing or rhonchi.. Gastrointestinal. Abdomen with no organomegaly, non tender, no rebound or guarding Skin. No rashes Musculoskeletal: no joint deformities    Labs on Admission: I have personally reviewed following labs and imaging studies  CBC: No results for input(s): WBC, NEUTROABS, HGB, HCT, MCV, PLT in the last 168 hours. Basic Metabolic Panel: No results for input(s): NA, K, CL, CO2, GLUCOSE, BUN, CREATININE, CALCIUM, MG, PHOS in the last 168 hours. GFR: CrCl cannot be calculated (Patient's most recent lab result is older than the maximum 21 days allowed.). Liver Function Tests: No results for input(s): AST, ALT, ALKPHOS, BILITOT, PROT, ALBUMIN in the last 168 hours. No results for input(s): LIPASE, AMYLASE in the last 168 hours. No results for input(s): AMMONIA in the last 168 hours. Coagulation Profile: No results for input(s): INR, PROTIME in the last 168 hours. Cardiac Enzymes: No results for input(s): CKTOTAL, CKMB, CKMBINDEX, TROPONINI in the last 168 hours. BNP (last 3 results) No results for input(s): PROBNP in the last 8760 hours. HbA1C: No results for input(s): HGBA1C in the last 72 hours. CBG: No results for input(s): GLUCAP in the last 168 hours. Lipid Profile: No results for input(s): CHOL, HDL, LDLCALC, TRIG, CHOLHDL, LDLDIRECT in the last 72 hours. Thyroid Function Tests: No results for input(s): TSH, T4TOTAL, FREET4, T3FREE, THYROIDAB in the last 72 hours. Anemia Panel: No results for input(s): VITAMINB12, FOLATE, FERRITIN, TIBC, IRON, RETICCTPCT in the last 72 hours. Urine analysis: No results found for: COLORURINE, APPEARANCEUR,  LABSPEC, PHURINE, GLUCOSEU, HGBUR, BILIRUBINUR, KETONESUR, PROTEINUR, UROBILINOGEN, NITRITE, LEUKOCYTESUR  Radiological Exams on Admission: No results found.  EKG: I  Assessment/Plan Principal Problem:   Pneumonia due to COVID-19 virus Active Problems:   Hypertension   T2DM (type 2 diabetes mellitus) ([place])   Dyslipidemia   1.  Acute hypoxic respiratory failure due to SARS COVID-19 viral pneumonia.  Patient has been admitted to the telemetry ward, continue supplemental oxygen per nasal cannula, target oxygen saturation more than 88%. Medical therapy with remdesivir and intravenous dexamethasone.  Currently his oximetry is 95% on room air, chest radiograph personally reviewed with bilateral interstitial infiltrates at bases in the periphery mainly on the left.    Depending on patient's progress will decide on further advanced therapies including Tocilizumab and convalescent plasma.  Certainly patient is at high risk for worsening hypoxic respiratory failure considering his age, comorbidities and bilateral viral pneumonia.  Will add antitussive agents, bronchodilators and airway clearing techniques with incentive spirometer and flutter valve.  Follow-up with inflammatory markers.  While hospitalized patient  will be seen by physical therapy and occupational therapy  2.  Hypertension.  Continue blood pressure control with amlodpine, hold ACE inhibitor's due to risk of hypotension or kidney injury.  3.  Type 2 diabetes mellitus/dyslipidemia. Add insulin sliding scale for glucose coverage and monitoring, for now hold on oral hyperglycemic agents. At home patient on 70/30  30 units bid. Will add 10 units levimir basal for now and will adjust depending glucose monitor. Check hemoglobin A1c.  Resume simvastatin.  4.  BPH.  Continue finasteride.  5.  Paroxysmal atrial fibrillation.  Continue rate control with atenolol and anticoagulation with apixaban.  DVT prophylaxis:  apixaban Code Status:   full  Family Communication: no family at the bedside/ unable to reach his son over the phone.    Disposition Plan: telemetry   Consults called:  None   Admission status:  Inpatient.     Verity Gilcrest Gerome Apley MD [facility]   07/01/2019, 5:16 PM

## 2019-07-02 LAB — C-REACTIVE PROTEIN: CRP: 2.5 mg/dL — ABNORMAL HIGH (ref <1.0)

## 2019-07-02 LAB — COMPREHENSIVE METABOLIC PANEL
ALT: 34 U/L (ref 0–44)
AST: 67 U/L — ABNORMAL HIGH (ref 15–41)
Albumin: 3.6 g/dL (ref 3.5–5.0)
Alkaline Phosphatase: 62 U/L (ref 38–126)
Anion gap: 16 — ABNORMAL HIGH (ref 5–15)
BUN: 40 mg/dL — ABNORMAL HIGH (ref 8–23)
CO2: 18 mmol/L — ABNORMAL LOW (ref 22–32)
Calcium: 8.9 mg/dL (ref 8.9–10.3)
Chloride: 101 mmol/L (ref 98–111)
Creatinine, Ser: 1.59 mg/dL — ABNORMAL HIGH (ref 0.61–1.24)
GFR calc Af Amer: 43 mL/min — ABNORMAL LOW (ref >60)
GFR calc non Af Amer: 37 mL/min — ABNORMAL LOW (ref >60)
Glucose, Bld: 345 mg/dL — ABNORMAL HIGH (ref 70–99)
Potassium: 4.4 mmol/L (ref 3.5–5.1)
Sodium: 135 mmol/L (ref 135–145)
Total Bilirubin: 0.8 mg/dL (ref 0.3–1.2)
Total Protein: 6.7 g/dL (ref 6.5–8.1)

## 2019-07-02 LAB — HEMOGLOBIN A1C
Hgb A1c MFr Bld: 8.3 % — ABNORMAL HIGH (ref 4.8–5.6)
Mean Plasma Glucose: 191.51 mg/dL

## 2019-07-02 LAB — D-DIMER, QUANTITATIVE: D-Dimer, Quant: 0.89 ug/mL-FEU — ABNORMAL HIGH (ref 0.00–0.50)

## 2019-07-02 LAB — GLUCOSE, CAPILLARY
Glucose-Capillary: 361 mg/dL — ABNORMAL HIGH (ref 70–99)
Glucose-Capillary: 373 mg/dL — ABNORMAL HIGH (ref 70–99)
Glucose-Capillary: 100 mg/dL — ABNORMAL HIGH (ref 70–99)

## 2019-07-02 LAB — FERRITIN: Ferritin: 49 ng/mL (ref 24–336)

## 2019-07-02 MED ORDER — INSULIN ASPART 100 UNIT/ML ~~LOC~~ SOLN
0.0000 [IU] | Freq: Three times a day (TID) | SUBCUTANEOUS | Status: DC
  Administered 2019-07-02: 15 [IU] via SUBCUTANEOUS
  Administered 2019-07-03: 18:00:00 5 [IU] via SUBCUTANEOUS
  Administered 2019-07-03: 15 [IU] via SUBCUTANEOUS
  Administered 2019-07-03: 8 [IU] via SUBCUTANEOUS
  Administered 2019-07-04: 15 [IU] via SUBCUTANEOUS
  Administered 2019-07-04: 5 [IU] via SUBCUTANEOUS
  Administered 2019-07-04: 8 [IU] via SUBCUTANEOUS
  Administered 2019-07-05: 10:00:00 5 [IU] via SUBCUTANEOUS

## 2019-07-02 MED ORDER — INSULIN DETEMIR 100 UNIT/ML ~~LOC~~ SOLN
20.0000 [IU] | Freq: Two times a day (BID) | SUBCUTANEOUS | Status: DC
  Administered 2019-07-02 – 2019-07-04 (×5): 20 [IU] via SUBCUTANEOUS
  Filled 2019-07-02 (×6): qty 0.2

## 2019-07-02 NOTE — Progress Notes (Addendum)
PROGRESS NOTE    David Steele  [id number] DOB: [birth year] DOA: 07/01/2019 PCP: Mathews Argyle, MD    Brief Narrative:  David Steele is a 86 y.o. male with medical history significant of hypertension, dyslipidemia, type 2 diabetes mellitus, hypoacusia and prostate cancer.  Patient reported for 4 days of not feeling well, generalized weakness, dry cough, and dyspnea.  His predominant symptom was weakness, no improving factors, worse with exertion, associated with fevers but no chills.  He had difficulty ambulating and poor appetite.  Due to persistent symptoms he presented on February 14 to [facility] emergency department where he was diagnosed with SARS COVID-19 viral pneumonia.  His oximetry was 92% on room air and on ambulation 87%.  He underwent further work-up with CT chest which was negative for pulmonary embolus, his white count was 7.1, hemoglobin 13.2, sodium 135, potassium 4.5, chloride 98, bicarb 25, BUN 20, creatinine 1.50, glucose 275.    On February 15 he was transferred to [facility] for further management.  At the time of transfer he had moderate dyspnea, worse with exertion, no improving factors.  He continued to feel weak but not frank fatigue.  Patient has been responding well to medical therapy.   Assessment & Plan:   Principal Problem:   Pneumonia due to COVID-19 virus Active Problems:   Hypertension   T2DM (type 2 diabetes mellitus) ([place])   Dyslipidemia    1.  Acute hypoxic respiratory failure due to SARS COVID-19 viral pneumonia.   RR: 17  Pulse oxymetry: 92%  Fi02: 2 L/ min per [place]  COVID-19 Labs  Recent Labs    07/01/19 1839 07/02/19 0255  DDIMER 0.63* 0.89*  FERRITIN 47 49  CRP 2.8* 2.5*    No results found for: SARSCOV2NAA  Stable inflammatory markers.   Continue medical  therapy with remdesivir (AST 67, ALT 34), intravenous dexamethasone,  antitussive agents, bronchodilators and airway clearing techniques  with incentive spirometer and flutter valve.    Out of bed to chair tid with meals, physical and occupational therapy. Nutrition consult for possible supplements.   Patient very weak and deconditioned.   2.  Hypertension.  Blood pressure 165/ 77 will continue with amlodipine for blood pressure control, keep systolic less than 99991111 mmHg.   3.  Uncontrolled Type 2 diabetes mellitus (Hgb A1c 8,3) with steroid induced hyperglycemia. Dyslipidemia. Fasting glucose 345, capillary 232 and 361. Will add basal insulin, at home on 30 units bid of 70/30, will increase basal to 20 unit bid and will continue insulin sliding scale for glucose cover and monitoring patient has poor oral intake.   4.  BPH. On finasteride.  5.  Paroxysmal atrial fibrillation.  Rate controlled with atenolol and anticoagulation with apixaban.  6. Depression. Continue with paroxetine.   DVT prophylaxis:  apixaban Code Status:  full   Family Communication: I spoke over the phone with the patient's son about patient's  condition, plan of care, prognosis and all questions were addressed.  Disposition Plan:  Patient continue acutely ill.    Subjective: Patient continue to have dyspnea, moderate in intensity, no nausea or vomiting, no chest pain. Continue to have weakness. Decreased hering, communication limited, most by writing.   Objective: Vitals:   07/01/19 2200 07/02/19 0000 07/02/19 0400 07/02/19 0719  BP:  138/65 (!) 159/85 (!) 165/77  Pulse:    89  Resp:  19  17  Temp:  98.1 F (36.7 C) 98.9 F (37.2 C) (!) 96.9 F (36.1 C)  TempSrc:  Oral Oral Axillary  SpO2: 91%   92%  Weight:      Height:        Intake/Output Summary (Last 24 hours) at 07/02/2019 0900 Last data filed at 07/02/2019 0600 Gross per 24 hour  Intake 120 ml  Output 950 ml  Net -830 ml   Filed Weights   07/01/19 1705  Weight: 98 kg    Examination:   General: Not in pain or dyspnea. Deconditioned  Neurology: Awake and alert, non  focal  E ENT: no pallor, no icterus, oral mucosa moist Cardiovascular: No JVD. S1-S2 present, rhythmic. No lower extremity edema. Pulmonary: positive breath sounds bilaterally.  Gastrointestinal. Abdomen with  no organomegaly, non tender, no rebound or guarding Skin. No rashes Musculoskeletal: no joint deformities     Data Reviewed: I have personally reviewed following labs and imaging studies  CBC: Recent Labs  Lab 07/01/19 1839  WBC 5.0  NEUTROABS 3.2  HGB 12.2*  HCT 35.5*  MCV 83.9  PLT [id number]   Basic Metabolic Panel: Recent Labs  Lab 07/01/19 1839 07/02/19 0255  NA 135 135  K 4.5 4.4  CL 101 101  CO2 21* 18*  GLUCOSE 327* 345*  BUN 38* 40*  CREATININE 1.71* 1.59*  CALCIUM 9.1 8.9   GFR: Estimated Creatinine Clearance: 36.1 mL/min (A) (by C-G formula based on SCr of 1.59 mg/dL (H)). Liver Function Tests: Recent Labs  Lab 07/01/19 1839 07/02/19 0255  AST 54* 67*  ALT 32 34  ALKPHOS 61 62  BILITOT 0.8 0.8  PROT 6.8 6.7  ALBUMIN 3.3* 3.6   No results for input(s): LIPASE, AMYLASE in the last 168 hours. No results for input(s): AMMONIA in the last 168 hours. Coagulation Profile: No results for input(s): INR, PROTIME in the last 168 hours. Cardiac Enzymes: No results for input(s): CKTOTAL, CKMB, CKMBINDEX, TROPONINI in the last 168 hours. BNP (last 3 results) No results for input(s): PROBNP in the last 8760 hours. HbA1C: Recent Labs    07/01/19 1839  HGBA1C 8.3*   CBG: Recent Labs  Lab 07/01/19 1821 07/01/19 2007 07/02/19 0735  GLUCAP 311* 232* 361*   Lipid Profile: No results for input(s): CHOL, HDL, LDLCALC, TRIG, CHOLHDL, LDLDIRECT in the last 72 hours. Thyroid Function Tests: No results for input(s): TSH, T4TOTAL, FREET4, T3FREE, THYROIDAB in the last 72 hours. Anemia Panel: Recent Labs    07/01/19 1839 07/02/19 [id number]      Radiology Studies: I have reviewed all of the imaging during this hospital visit  personally     Scheduled Meds: . amLODipine  5 mg Oral Daily  . apixaban  5 mg Oral BID  . vitamin C  500 mg Oral Daily  . aspirin  81 mg Oral Daily  . chlorpheniramine-HYDROcodone  5 mL Oral Q12H  . dexamethasone (DECADRON) injection  6 mg Intravenous Q24H  . finasteride  5 mg Oral Daily  . insulin aspart  0-9 Units Subcutaneous TID WC  . insulin detemir  10 Units Subcutaneous QHS  . Ipratropium-Albuterol  1 puff Inhalation Q6H  . mirabegron ER  50 mg Oral QHS  . PARoxetine  30 mg Oral Daily  . zinc sulfate  220 mg Oral Daily   Continuous Infusions: . remdesivir 100 mg in NS 100 mL       LOS: 1 day        Mariaisabel Bodiford Gerome Apley, MD

## 2019-07-03 DIAGNOSIS — E7800 Pure hypercholesterolemia, unspecified: Secondary | ICD-10-CM

## 2019-07-03 DIAGNOSIS — IMO0002 Reserved for concepts with insufficient information to code with codable children: Secondary | ICD-10-CM | POA: Diagnosis present

## 2019-07-03 DIAGNOSIS — J9601 Acute respiratory failure with hypoxia: Secondary | ICD-10-CM

## 2019-07-03 DIAGNOSIS — E785 Hyperlipidemia, unspecified: Secondary | ICD-10-CM | POA: Diagnosis present

## 2019-07-03 DIAGNOSIS — E1165 Type 2 diabetes mellitus with hyperglycemia: Secondary | ICD-10-CM

## 2019-07-03 DIAGNOSIS — N183 Chronic kidney disease, stage 3 unspecified: Secondary | ICD-10-CM | POA: Diagnosis present

## 2019-07-03 DIAGNOSIS — N1831 Chronic kidney disease, stage 3a: Secondary | ICD-10-CM

## 2019-07-03 DIAGNOSIS — C61 Malignant neoplasm of prostate: Secondary | ICD-10-CM | POA: Diagnosis present

## 2019-07-03 DIAGNOSIS — E118 Type 2 diabetes mellitus with unspecified complications: Secondary | ICD-10-CM

## 2019-07-03 HISTORY — DX: Acute respiratory failure with hypoxia: J96.01

## 2019-07-03 HISTORY — DX: Reserved for concepts with insufficient information to code with codable children: IMO0002

## 2019-07-03 HISTORY — DX: Malignant neoplasm of prostate: C61

## 2019-07-03 HISTORY — DX: Chronic kidney disease, stage 3 unspecified: N18.30

## 2019-07-03 HISTORY — DX: Hyperlipidemia, unspecified: E78.5

## 2019-07-03 HISTORY — DX: Type 2 diabetes mellitus with hyperglycemia: E11.65

## 2019-07-03 LAB — FERRITIN: Ferritin: 44 ng/mL (ref 24–336)

## 2019-07-03 LAB — COMPREHENSIVE METABOLIC PANEL
ALT: 34 U/L (ref 0–44)
AST: 66 U/L — ABNORMAL HIGH (ref 15–41)
Albumin: 3.7 g/dL (ref 3.5–5.0)
Alkaline Phosphatase: 67 U/L (ref 38–126)
Anion gap: 12 (ref 5–15)
BUN: 53 mg/dL — ABNORMAL HIGH (ref 8–23)
CO2: 22 mmol/L (ref 22–32)
Calcium: 8.9 mg/dL (ref 8.9–10.3)
Chloride: 103 mmol/L (ref 98–111)
Creatinine, Ser: 1.52 mg/dL — ABNORMAL HIGH (ref 0.61–1.24)
GFR calc Af Amer: 46 mL/min — ABNORMAL LOW (ref >60)
GFR calc non Af Amer: 39 mL/min — ABNORMAL LOW (ref >60)
Glucose, Bld: 182 mg/dL — ABNORMAL HIGH (ref 70–99)
Potassium: 4.3 mmol/L (ref 3.5–5.1)
Sodium: 137 mmol/L (ref 135–145)
Total Bilirubin: 0.6 mg/dL (ref 0.3–1.2)
Total Protein: 7.3 g/dL (ref 6.5–8.1)

## 2019-07-03 LAB — CBC WITH DIFFERENTIAL/PLATELET
Abs Immature Granulocytes: 0.04 10*3/uL (ref 0.00–0.07)
Basophils Absolute: 0 10*3/uL (ref 0.0–0.1)
Basophils Relative: 0 %
Eosinophils Absolute: 0 10*3/uL (ref 0.0–0.5)
Eosinophils Relative: 0 %
HCT: 42.1 % (ref 39.0–52.0)
Hemoglobin: 13.8 g/dL (ref 13.0–17.0)
Immature Granulocytes: 0 %
Lymphocytes Relative: 11 %
Lymphs Abs: 1 10*3/uL (ref 0.7–4.0)
MCH: 28.8 pg (ref 26.0–34.0)
MCHC: 32.8 g/dL (ref 30.0–36.0)
MCV: 87.7 fL (ref 80.0–100.0)
Monocytes Absolute: 0.3 10*3/uL (ref 0.1–1.0)
Monocytes Relative: 4 %
Neutro Abs: 7.9 10*3/uL — ABNORMAL HIGH (ref 1.7–7.7)
Neutrophils Relative %: 85 %
Platelets: 228 10*3/uL (ref 150–400)
RBC: 4.8 MIL/uL (ref 4.22–5.81)
RDW: 17.2 % — ABNORMAL HIGH (ref 11.5–15.5)
WBC: 9.4 10*3/uL (ref 4.0–10.5)
nRBC: 0 % (ref 0.0–0.2)

## 2019-07-03 LAB — PHOSPHORUS: Phosphorus: 3.3 mg/dL (ref 2.5–4.6)

## 2019-07-03 LAB — MAGNESIUM: Magnesium: 1.8 mg/dL (ref 1.7–2.4)

## 2019-07-03 LAB — GLUCOSE, CAPILLARY
Glucose-Capillary: 252 mg/dL — ABNORMAL HIGH (ref 70–99)
Glucose-Capillary: 101 mg/dL — ABNORMAL HIGH (ref 70–99)
Glucose-Capillary: 226 mg/dL — ABNORMAL HIGH (ref 70–99)
Glucose-Capillary: 390 mg/dL — ABNORMAL HIGH (ref 70–99)
Glucose-Capillary: 244 mg/dL — ABNORMAL HIGH (ref 70–99)
Glucose-Capillary: 257 mg/dL — ABNORMAL HIGH (ref 70–99)

## 2019-07-03 LAB — C-REACTIVE PROTEIN: CRP: 2.2 mg/dL — ABNORMAL HIGH (ref <1.0)

## 2019-07-03 LAB — D-DIMER, QUANTITATIVE: D-Dimer, Quant: 1.48 ug/mL-FEU — ABNORMAL HIGH (ref 0.00–0.50)

## 2019-07-03 MED ORDER — ENSURE ENLIVE PO LIQD
237.0000 mL | Freq: Three times a day (TID) | ORAL | Status: DC
  Administered 2019-07-03 – 2019-07-05 (×7): 237 mL via ORAL

## 2019-07-03 MED ORDER — INSULIN ASPART 100 UNIT/ML ~~LOC~~ SOLN
8.0000 [IU] | Freq: Three times a day (TID) | SUBCUTANEOUS | Status: DC
  Administered 2019-07-03 – 2019-07-04 (×4): 8 [IU] via SUBCUTANEOUS

## 2019-07-03 MED ORDER — IPRATROPIUM-ALBUTEROL 20-100 MCG/ACT IN AERS
1.0000 | INHALATION_SPRAY | Freq: Four times a day (QID) | RESPIRATORY_TRACT | Status: DC
  Administered 2019-07-03 – 2019-07-06 (×12): 1 via RESPIRATORY_TRACT

## 2019-07-03 NOTE — Progress Notes (Addendum)
Initial Nutrition Assessment RD working remotely.  DOCUMENTATION CODES:   Not applicable  INTERVENTION:   Ensure Enlive po TID, each supplement provides 350 kcal and 20 grams of protein  Hormel Shake daily with Breakfast which provides 520 kcals and 22 g of protein and Magic cup BID with lunch and dinner, each supplement provides 290 kcal and 9 grams of protein, automatically on meal trays to optimize nutritional intake.   Recommend liberalize diet to regular to help improve PO intake.  NUTRITION DIAGNOSIS:   Increased nutrient needs related to catabolic illness, acute illness(COVID 19) as evidenced by estimated needs.  GOAL:   Patient will meet greater than or equal to 90% of their needs  MONITOR:   PO intake, Supplement acceptance, Labs  REASON FOR ASSESSMENT:   Consult Assessment of nutrition requirement/status  ASSESSMENT:   86 yo male admitted with progressive weakness, poor appetite r/t COVID 19 PNA. Tested positive for COVID 19 at [facility] on 2/14. PMH includes HTN, HLD, DM-2.  Currently on a heart healthy CHO modified diet, consumed 25% of lunch yesterday. No other meal completions recorded.  He is on room air.  Patient would benefit from nutrient dense PO supplements to help meet increased nutrition needs for COVID-19.  Labs reviewed.  CBG's: [id number]  Medications reviewed and include vitamin C, decadron, novolog, levemir, zinc sulfate, remdesivir.  No recent weights available to review.  NUTRITION - FOCUSED PHYSICAL EXAM:  unable to complete  Diet Order:   Diet Order            Diet heart healthy/carb modified Room service appropriate? Yes; Fluid consistency: Thin  Diet effective now              EDUCATION NEEDS:   No education needs have been identified at this time  Skin:  Skin Assessment: Reviewed RN Assessment  Last BM:  no BM documented  Height:   Ht Readings from Last 1 Encounters:  07/01/19 5\' 11"  (1.803 m)     Weight:   Wt Readings from Last 1 Encounters:  07/01/19 98 kg    Estimated Nutritional Needs:   Kcal:  1800-2100  Protein:  100-130 gm  Fluid:  >/= 1.8 L    Molli Barrows, RD, LDN, CNSC Please refer to Amion for contact information.

## 2019-07-03 NOTE — Evaluation (Signed)
Physical Therapy Evaluation Patient Details Name: David Steele MRN: [id number] DOB: 1926-01-19 Today's Date: 07/03/2019   History of Present Illness  86 y.o.malewith medical history significant ofhypertension, dyslipidemia, type 2 diabetes mellitus, hypoacusia and prostate cancer. Patient reported for 4 days of not feeling well, generalized weakness, dry cough, and dyspnea. His predominant symptom was weakness, no improving factors, worse with exertion, associated with fevers but no chills. He had difficulty ambulating and poor appetite. [facility]-19 viral [facility]. On February 15 he was transferred to [facility] for further management.  Clinical Impression   Pt extremely HOH, all instructions need to be written down. Noted pt perseverates on losing his watch and possibly hearing aide but unable to tell much past that he had them at some point and how much they cost. Therapist attempted to educate on pursed lip breathing and pt at times nods and verbalizes agreement but when asked to return demo he was not able to. He will need reinforcement of pursed lip breathing as when he ambulates he tends to desat into high 70s. At rest on 2L/min via [place] pt sats in 90s and shows no distress. Pt is highly impulsive needing continued and stern cues to slow down and mind lines etc. He is insistent at home he is independent and does not use any Ads, he states he is able to communicate with his son without any problem, but now in hospital he is nervous and can not hear a word staff says. Pt will greatly benefit from continued acute care level tx while in hospital to address noted deficits in strength, independence, balance and coordination also activity tolerance. At d/c he should be able to return home with family but will benefit from continued rehabilitation through [facility].       Follow Up Recommendations Home health PT    Equipment Recommendations  Rolling walker with 5" wheels    Recommendations for Other Services OT consult     Precautions / Restrictions Precautions Precautions: Fall Precaution Comments: extremely HOH Restrictions Weight Bearing Restrictions: No      Mobility  Bed Mobility Overal bed mobility: Needs Assistance Bed Mobility: Supine to Sit;Sit to Supine     Supine to sit: Supervision Sit to supine: Supervision   General bed mobility comments: needs line management and also cues to slow down as he is very impulsive  Transfers Overall transfer level: Needs assistance Equipment used: Rolling walker (2 wheeled) Transfers: Sit to/from [employer] Transfers Sit to Stand: Supervision Stand pivot transfers: Supervision       General transfer comment: pt highly impulsive needing continued cues visual and tactile for safety  Ambulation/Gait Ambulation/Gait assistance: Min guard Gait Distance (Feet): 150 Feet Assistive device: Rolling walker (2 wheeled) Gait Pattern/deviations: Step-through pattern;Wide base of support;Ataxic Gait velocity: decreased   General Gait Details: pt is quite shaky with ambulation, he states that he is nervous and has never used a walker before.  Stairs            Wheelchair Mobility    Modified Rankin (Stroke Patients Only)       Balance Overall balance assessment: Needs assistance Sitting-balance support: Feet supported Sitting balance-Leahy Scale: Good     Standing balance support: During functional activity;Bilateral upper extremity supported Standing balance-Leahy Scale: Fair  Pertinent Vitals/Pain Pain Assessment: No/denies pain    Home Living Family/patient expects to be discharged to:: Private residence Living Arrangements: Children               Additional Comments: extremely difficult to get PLOF as pt is  extremely HOH and even with writing down instructions etc he does not seem to understand     Prior Function Level of Independence: Independent(as per own reporting)               Hand Dominance        Extremity/Trunk Assessment   Upper Extremity Assessment Upper Extremity Assessment: Generalized weakness    Lower Extremity Assessment Lower Extremity Assessment: Generalized weakness    Cervical / Trunk Assessment Cervical / Trunk Assessment: Normal  Communication   Communication: HOH(extremely)  Cognition Arousal/Alertness: Awake/alert Behavior During Therapy: Restless;Impulsive Overall Cognitive Status: No family/caregiver present to determine baseline cognitive functioning                                 General Comments: cognition unknown and difficult to gage, pt is [facility] and staff have been writing down questions and instructions for him. Therapist attempted multiple times to get pt to complete pursed lip breathing and pt was not able to complete one return demo.       General Comments      Exercises Other Exercises Other Exercises: completed flutter valve x 10 Other Exercises: incentive spirometer x 10 pulls max 1536ml   Assessment/Plan    PT Assessment Patient needs continued PT services  PT Problem List Decreased strength;Decreased activity tolerance;Decreased balance;Decreased mobility;Decreased coordination;Decreased cognition;Decreased knowledge of use of DME;Decreased safety awareness       PT Treatment Interventions DME instruction;Gait training;Stair training;Functional mobility training;Therapeutic activities;Therapeutic exercise;Balance training;Neuromuscular re-education;Patient/family education    PT Goals (Current goals can be found in the Care Plan section)  Acute Rehab PT Goals Patient Stated Goal: to go home Time For Goal Achievement: 07/17/19 Potential to Achieve Goals: Good    Frequency Min 3X/week   Barriers  to discharge        Co-evaluation               AM-PAC PT "6 Clicks" Mobility  Outcome Measure Help needed turning from your back to your side while in a flat bed without using bedrails?: None Help needed moving from lying on your back to sitting on the side of a flat bed without using bedrails?: None Help needed moving to and from a bed to a chair (including a wheelchair)?: A Little Help needed standing up from a chair using your arms (e.g., wheelchair or bedside chair)?: A Little Help needed to walk in hospital room?: A Little Help needed climbing 3-5 steps with a railing? : A Lot 6 Click Score: 19    End of Session Equipment Utilized During Treatment: Oxygen;Gait belt Activity Tolerance: Patient limited by fatigue;Patient limited by lethargy Patient left: in bed;with call bell/phone within reach;with bed alarm set Nurse Communication: Mobility status PT Visit Diagnosis: Unsteadiness on feet (R26.81);Other abnormalities of gait and mobility (R26.89);Muscle weakness (generalized) (M62.81);Ataxic gait (R26.0)    Time: 1420-1510 PT Time Calculation (min) (ACUTE ONLY): 50 min   Charges:   PT Evaluation $PT Eval Moderate Complexity: 1 Mod PT Treatments $Gait Training: 8-22 mins $Therapeutic Activity: 8-22 mins        Horald Chestnut, PT   Delford Field 07/03/2019,  4:42 PM

## 2019-07-03 NOTE — Progress Notes (Signed)
Updated son on current condition and treatment plan.

## 2019-07-03 NOTE — Progress Notes (Signed)
PROGRESS NOTE    David Steele  [id number] DOB: Aug 07, 1925 DOA: 07/01/2019 PCP: Mathews Argyle, MD   Brief Narrative:  David Steele a 86 y.o.malePMHx HTN, HLD, DM type 2 diabetes mellitus complication, hypoacusia and prostate cancer.  Patient reported for 4 days of not feeling well, generalized weakness, dry cough, and dyspnea. His predominant symptom was weakness, no improving factors, worse with exertion, associated with fevers but no chills. He had difficulty ambulating and poor appetite.  [facility] COVID-19 viral pneumonia. His oximetry was 92% on room air and on ambulation 87%. He underwent further work-up with CT chest which was negative for pulmonary embolus,his white count was 7.1, hemoglobin 13.2, sodium 135, potassium 4.5, chloride 98, bicarb 25, BUN 20, creatinine 1.50, glucose 275.   On February 15 he was transferred to [facility] for further management.  At the time of transfer he had moderate dyspnea, worse with exertion, no improving factors. He continued to feel weak but not frank fatigue.  Patient has been responding well to medical therapy.    Subjective: A/O x4, positive possibly.  Concerned that his hearing aids and watch are missing   Assessment & Plan:   Principal Problem:   Pneumonia due to COVID-19 virus Active Problems:   Hypertension   T2DM (type 2 diabetes mellitus) ([place])   Dyslipidemia   Acute respiratory failure with hypoxia ([place])   Diabetes mellitus type 2, uncontrolled, with complications ([place])   Prostate cancer ([place])   CKD (chronic kidney disease), stage III   HLD (hyperlipidemia)  Covid pneumonia/Acute Respiratory failure with hypoxia COVID-19 Labs  Recent Labs    07/01/19 1839 07/02/19 0255 07/03/19 0209  DDIMER 0.63* 0.89* 1.48*  FERRITIN 47 49 44  CRP 2.8* 2.5* 2.2*       2/14 Covid positive [facility]  -Decadron 6 mg daily -Per pharmacy protocol -Vitamins per Covid protocol -Combivent -Titrate O2 to maintain SPO2> 88% -Prone 16 hours/day, if patient cannot tolerate prone 2 to 3 hours per shift  Essential HTN -Amlodipine 5 mg daily   DM type II uncontrolled with complication -123456 hemoglobin A1c= 8.3 -Levemir 20 units BID -2/17 NovoLog 8 units qac -Moderate SSI -2/17 consult to diabetic coordinator -2/17 consult to diabetic nutrition  HLD -Lipid panel pending  CKD stage III (baseline Cr 1.71) Recent Labs  Lab 07/01/19 1839 07/02/19 0255 07/03/19 0209  CREATININE 1.71* 1.59* 1.52*  -At baseline     DVT prophylaxis: Apixaban Code Status: Full Family Communication:  Disposition Plan:    Consultants:    Procedures/Significant Events:     I have personally reviewed and interpreted all radiology studies and my findings are as above.  VENTILATOR SETTINGS: Nasal cannula 2/17 SPO2; 90%   Cultures 2/14 Covid positive [facility]    Antimicrobials: Anti-infectives (From admission, onward)   Start     Dose/Rate Stop   07/02/19 1000  remdesivir 100 mg in sodium chloride 0.9 % 100 mL IVPB  Status:  Discontinued     100 mg 200 mL/hr over 30 Minutes 07/01/19 1727   07/02/19 1000  remdesivir 100 mg in sodium chloride 0.9 % 100 mL IVPB     100 mg 200 mL/hr over 30 Minutes 07/06/19 0959   07/01/19 1715  remdesivir 200 mg in sodium chloride 0.9% 250 mL IVPB  Status:  Discontinued     200 mg 580 mL/hr over 30 Minutes 07/01/19 1727  Devices    LINES / TUBES:      Continuous Infusions: . remdesivir 100 mg in NS 100 mL 100 mg (07/03/19 1023)     Objective: Vitals:   07/02/19 1932 07/03/19 0000 07/03/19 0406 07/03/19 0800  BP: 116/65  (!) 113/59 137/88  Pulse: 87  74 72  Resp: 17 20 19 18   Temp: 98.3 F (36.8 C) 98.7 F (37.1 C) 98.4 F (36.9 C) 98.2 F (36.8 C)  TempSrc: Axillary Oral  Axillary Oral  SpO2: 93%  92% 90%  Weight:      Height:        Intake/Output Summary (Last 24 hours) at 07/03/2019 1624 Last data filed at 07/03/2019 1200 Gross per 24 hour  Intake 1015 ml  Output 650 ml  Net 365 ml   Filed Weights   07/01/19 1705  Weight: 98 kg    Examination:  General: A/O x4, extremely hard of hearing, positive acute respiratory distress Eyes: negative scleral hemorrhage, negative anisocoria, negative icterus ENT: Negative Runny nose, negative gingival bleeding, Neck:  Negative scars, masses, torticollis, lymphadenopathy, JVD Lungs: Clear to auscultation bilaterally without wheezes or crackles Cardiovascular: Regular rate and rhythm without murmur gallop or rub normal S1 and S2 Abdomen: negative abdominal pain, nondistended, positive soft, bowel sounds, no rebound, no ascites, no appreciable mass Extremities: No significant cyanosis, clubbing, or edema bilateral lower extremities Skin: Negative rashes, lesions, ulcers Psychiatric:  Negative depression, negative anxiety, negative fatigue, negative mania  Central nervous system:  Cranial nerves II through XII intact, tongue/uvula midline, all extremities muscle strength 5/5, sensation intact throughout, negative dysarthria, negative expressive aphasia, negative receptive aphasia.  .     Data Reviewed: Care during the described time interval was provided by me .  I have reviewed this patient's available data, including medical history, events of note, physical examination, and all test results as part of my evaluation.   CBC: Recent Labs  Lab 07/01/19 1839 07/03/19 0755  WBC 5.0 9.4  NEUTROABS 3.2 7.9*  HGB 12.2* 13.8  HCT 35.5* 42.1  MCV 83.9 87.7  PLT 226 [id number]   Basic Metabolic Panel: Recent Labs  Lab 07/01/19 1839 07/02/19 0255 07/03/19 0209 07/03/19 0755  NA 135 135 137  --   K 4.5 4.4 4.3  --   CL 101 101 103  --   CO2 21* 18* 22  --   GLUCOSE 327* 345* 182*  --   BUN 38* 40* 53*  --     CREATININE 1.71* 1.59* 1.52*  --   CALCIUM 9.1 8.9 8.9  --   MG  --   --   --  1.8  PHOS  --   --   --  3.3   GFR: Estimated Creatinine Clearance: 37.8 mL/min (A) (by C-G formula based on SCr of 1.52 mg/dL (H)). Liver Function Tests: Recent Labs  Lab 07/01/19 1839 07/02/19 0255 07/03/19 0209  AST 54* 67* 66*  ALT 32 34 34  ALKPHOS 61 62 67  BILITOT 0.8 0.8 0.6  PROT 6.8 6.7 7.3  ALBUMIN 3.3* 3.6 3.7   No results for input(s): LIPASE, AMYLASE in the last 168 hours. No results for input(s): AMMONIA in the last 168 hours. Coagulation Profile: No results for input(s): INR, PROTIME in the last 168 hours. Cardiac Enzymes: No results for input(s): CKTOTAL, CKMB, CKMBINDEX, TROPONINI in the last 168 hours. BNP (last 3 results) No results for input(s): PROBNP in the last 8760 hours. HbA1C: Recent Labs  07/01/19 1839  HGBA1C 8.3*   CBG: Recent Labs  Lab 07/02/19 1618 07/02/19 2029 07/02/19 2052 07/03/19 0755 07/03/19 1259  GLUCAP 373* 100* 101* 226* 390*   Lipid Profile: No results for input(s): CHOL, HDL, LDLCALC, TRIG, CHOLHDL, LDLDIRECT in the last 72 hours. Thyroid Function Tests: No results for input(s): TSH, T4TOTAL, FREET4, T3FREE, THYROIDAB in the last 72 hours. Anemia Panel: Recent Labs    07/02/19 0255 07/03/19 0209  FERRITIN 49 44   Urine analysis: No results found for: COLORURINE, APPEARANCEUR, LABSPEC, PHURINE, GLUCOSEU, HGBUR, BILIRUBINUR, KETONESUR, PROTEINUR, UROBILINOGEN, NITRITE, LEUKOCYTESUR Sepsis Labs: @LABRCNTIP (procalcitonin:4,lacticidven:4)  )No results found for this or any previous visit (from the past 240 hour(s)).       Radiology Studies: DG Chest 1 View  Result Date: 07/01/2019 CLINICAL DATA:  Dyspnea EXAM: CHEST  1 VIEW COMPARISON:  06/30/2019 FINDINGS: Single frontal view of the chest demonstrates a stable cardiac silhouette. Atherosclerosis of the aorta unchanged. Chronic scarring seen at the lung bases. No acute airspace  disease, effusion, or pneumothorax. 1.6 cm calcified nodule seen overlying the left mid chest unchanged since numerous prior exams. IMPRESSION: 1. Stable exam, no acute process. Electronically Signed   By: Randa Ngo M.D.   On: 07/01/2019 19:29        Scheduled Meds: . amLODipine  5 mg Oral Daily  . apixaban  5 mg Oral BID  . vitamin C  500 mg Oral Daily  . aspirin  81 mg Oral Daily  . chlorpheniramine-HYDROcodone  5 mL Oral Q12H  . dexamethasone (DECADRON) injection  6 mg Intravenous Q24H  . feeding supplement (ENSURE ENLIVE)  237 mL Oral TID BM  . finasteride  5 mg Oral Daily  . insulin aspart  0-15 Units Subcutaneous TID WC  . insulin aspart  8 Units Subcutaneous TID WC  . insulin detemir  20 Units Subcutaneous BID  . Ipratropium-Albuterol  1 puff Inhalation QID  . mirabegron ER  50 mg Oral QHS  . PARoxetine  30 mg Oral Daily  . zinc sulfate  220 mg Oral Daily   Continuous Infusions: . remdesivir 100 mg in NS 100 mL 100 mg (07/03/19 1023)     LOS: 2 days   The patient is critically ill with multiple organ systems failure and requires high complexity decision making for assessment and support, frequent evaluation and titration of therapies, application of advanced monitoring technologies and extensive interpretation of multiple databases. Critical Care Time devoted to patient care services described in this note  Time spent: 40 minutes    Aundre Hietala, Geraldo Docker, MD [facility] Pager [phone]  If 7PM-7AM, please contact night-coverage [email or web address] Password [facility] 07/03/2019, 4:24 PM

## 2019-07-03 NOTE — Progress Notes (Signed)
Uneventful shift. No falls or injuries this shift. No cardiac or respiratory issues this shift. Remains alert and oriented x 4, ambulated well with PT today. Remains on 2LPM [employer]. IV intact and patent. Call bell within reach. Will continue to monitor.

## 2019-07-04 LAB — CBC WITH DIFFERENTIAL/PLATELET
Abs Immature Granulocytes: 0.02 10*3/uL (ref 0.00–0.07)
Basophils Absolute: 0 10*3/uL (ref 0.0–0.1)
Basophils Relative: 0 %
Eosinophils Absolute: 0 10*3/uL (ref 0.0–0.5)
Eosinophils Relative: 0 %
HCT: 39 % (ref 39.0–52.0)
Hemoglobin: 13.1 g/dL (ref 13.0–17.0)
Immature Granulocytes: 0 %
Lymphocytes Relative: 11 %
Lymphs Abs: 1 10*3/uL (ref 0.7–4.0)
MCH: 28.9 pg (ref 26.0–34.0)
MCHC: 33.6 g/dL (ref 30.0–36.0)
MCV: 86.1 fL (ref 80.0–100.0)
Monocytes Absolute: 0.3 10*3/uL (ref 0.1–1.0)
Monocytes Relative: 4 %
Neutro Abs: 7.4 10*3/uL (ref 1.7–7.7)
Neutrophils Relative %: 85 %
Platelets: 252 10*3/uL (ref 150–400)
RBC: 4.53 MIL/uL (ref 4.22–5.81)
RDW: 16.4 % — ABNORMAL HIGH (ref 11.5–15.5)
WBC: 8.7 10*3/uL (ref 4.0–10.5)
nRBC: 0 % (ref 0.0–0.2)

## 2019-07-04 LAB — GLUCOSE, CAPILLARY
Glucose-Capillary: 265 mg/dL — ABNORMAL HIGH (ref 70–99)
Glucose-Capillary: 453 mg/dL — ABNORMAL HIGH (ref 70–99)
Glucose-Capillary: 437 mg/dL — ABNORMAL HIGH (ref 70–99)
Glucose-Capillary: 462 mg/dL — ABNORMAL HIGH (ref 70–99)

## 2019-07-04 LAB — COMPREHENSIVE METABOLIC PANEL
ALT: 35 U/L (ref 0–44)
AST: 51 U/L — ABNORMAL HIGH (ref 15–41)
Albumin: 3.3 g/dL — ABNORMAL LOW (ref 3.5–5.0)
Alkaline Phosphatase: 65 U/L (ref 38–126)
Anion gap: 10 (ref 5–15)
BUN: 57 mg/dL — ABNORMAL HIGH (ref 8–23)
CO2: 23 mmol/L (ref 22–32)
Calcium: 8.6 mg/dL — ABNORMAL LOW (ref 8.9–10.3)
Chloride: 103 mmol/L (ref 98–111)
Creatinine, Ser: 1.4 mg/dL — ABNORMAL HIGH (ref 0.61–1.24)
GFR calc Af Amer: 51 mL/min — ABNORMAL LOW (ref >60)
GFR calc non Af Amer: 44 mL/min — ABNORMAL LOW (ref >60)
Glucose, Bld: 243 mg/dL — ABNORMAL HIGH (ref 70–99)
Potassium: 4.3 mmol/L (ref 3.5–5.1)
Sodium: 136 mmol/L (ref 135–145)
Total Bilirubin: 0.6 mg/dL (ref 0.3–1.2)
Total Protein: 6.6 g/dL (ref 6.5–8.1)

## 2019-07-04 LAB — LIPID PANEL
Cholesterol: 138 mg/dL (ref 0–200)
HDL: 36 mg/dL — ABNORMAL LOW (ref >40)
LDL Cholesterol: 95 mg/dL (ref 0–99)
Total CHOL/HDL Ratio: 3.8 RATIO
Triglycerides: 35 mg/dL (ref <150)
VLDL: 7 mg/dL (ref 0–40)

## 2019-07-04 LAB — D-DIMER, QUANTITATIVE: D-Dimer, Quant: 1.62 ug/mL-FEU — ABNORMAL HIGH (ref 0.00–0.50)

## 2019-07-04 LAB — PHOSPHORUS: Phosphorus: 2.8 mg/dL (ref 2.5–4.6)

## 2019-07-04 LAB — FERRITIN: Ferritin: 39 ng/mL (ref 24–336)

## 2019-07-04 LAB — C-REACTIVE PROTEIN: CRP: 1.3 mg/dL — ABNORMAL HIGH (ref <1.0)

## 2019-07-04 MED ORDER — INSULIN ASPART 100 UNIT/ML ~~LOC~~ SOLN
14.0000 [IU] | Freq: Three times a day (TID) | SUBCUTANEOUS | Status: DC
  Administered 2019-07-05: 14 [IU] via SUBCUTANEOUS

## 2019-07-04 MED ORDER — ATORVASTATIN CALCIUM 40 MG PO TABS
40.0000 mg | ORAL_TABLET | Freq: Every day | ORAL | Status: DC
  Administered 2019-07-04 – 2019-07-05 (×2): 40 mg via ORAL
  Filled 2019-07-04: qty 1

## 2019-07-04 MED ORDER — INSULIN ASPART 100 UNIT/ML ~~LOC~~ SOLN
10.0000 [IU] | Freq: Once | SUBCUTANEOUS | Status: AC
  Administered 2019-07-04: 22:00:00 10 [IU] via SUBCUTANEOUS

## 2019-07-04 MED ORDER — HYDRALAZINE HCL 20 MG/ML IJ SOLN: 5.0000 mg | INTRAMUSCULAR | Status: DC | PRN

## 2019-07-04 MED ORDER — INSULIN DETEMIR 100 UNIT/ML ~~LOC~~ SOLN
25.0000 [IU] | Freq: Two times a day (BID) | SUBCUTANEOUS | Status: DC
  Administered 2019-07-04 – 2019-07-05 (×2): 25 [IU] via SUBCUTANEOUS
  Filled 2019-07-04 (×3): qty 0.25

## 2019-07-04 NOTE — Discharge Instructions (Signed)

## 2019-07-04 NOTE — Progress Notes (Signed)
UPDATE Son David Steele called and updated on plan of care, all questions answered

## 2019-07-04 NOTE — Progress Notes (Signed)
PROGRESS NOTE    David Steele  [id number] DOB: 1925/09/05 DOA: 07/01/2019 PCP: Mathews Argyle, MD   Brief Narrative:  [name] a 86 y.o.malePMHx HTN, HLD, DM type 2 diabetes mellitus complication, hypoacusia and prostate cancer.  Patient reported for 4 days of not feeling well, generalized weakness, dry cough, and dyspnea. His predominant symptom was weakness, no improving factors, worse with exertion, associated with fevers but no chills. He had difficulty ambulating and poor appetite.  [facility] with SARS COVID-19 viral pneumonia. His oximetry was 92% on room air and on ambulation 87%. He underwent further work-up with CT chest which was negative for pulmonary embolus,his white count was 7.1, hemoglobin 13.2, sodium 135, potassium 4.5, chloride 98, bicarb 25, BUN 20, creatinine 1.50, glucose 275.   On February 15 he was transferred to [facility] for further management.  At the time of transfer he had moderate dyspnea, worse with exertion, no improving factors. He continued to feel weak but not frank fatigue.  Patient has been responding well to medical therapy.    Subjective: 2/18 afebrile overnight A/O x4,, negative CP positive S OB.  Still concerned about missing hearing aids and watch.   Assessment & Plan:   Principal Problem:   Pneumonia due to COVID-19 virus Active Problems:   Hypertension   T2DM (type 2 diabetes mellitus) ([place])   Dyslipidemia   Acute respiratory failure with hypoxia ([place])   Diabetes mellitus type 2, uncontrolled, with complications ([place])   Prostate cancer ([place])   CKD (chronic kidney disease), stage III   HLD (hyperlipidemia)  Covid pneumonia/Acute Respiratory failure with hypoxia COVID-19 Labs  Recent Labs    07/02/19 0255 07/03/19 0209 07/04/19 0210  DDIMER 0.89* 1.48* 1.62*  FERRITIN 49 44  39  CRP 2.5* 2.2* 1.3*     2/14 Covid positive [facility]  -Decadron 6 mg daily -Remdesivir Per pharmacy protocol -Vitamins per Covid protocol -Combivent -Titrate O2 to maintain SPO2> 88% -Prone 16 hours/day, if patient cannot tolerate prone 2 to 3 hours per shift  Essential HTN -Amlodipine 5 mg daily -Hydralazine PRN  DM type II uncontrolled with complication -123456 hemoglobin A1c= 8.3 -2/17 consult to diabetic coordinator -2/17 consult to diabetic nutrition -2/18 increase Levemir 25 units BID -2/18 increase NovoLog 14 units qac -Moderate SSI  HLD -2/18 LDL = 95 -2/18 Lipitor 40 mg daily  CKD stage III (baseline Cr 1.71) Recent Labs  Lab 07/01/19 1839 07/02/19 0255 07/03/19 0209 07/04/19 0210  CREATININE 1.71* 1.59* 1.52* 1.40*  -At baseline     DVT prophylaxis: Apixaban Code Status: Full Family Communication:  Disposition Plan:    Consultants:    Procedures/Significant Events:     I have personally reviewed and interpreted all radiology studies and my findings are as above.  VENTILATOR SETTINGS: Nasal cannula 2/18 Flow 2 L/min SPO2; 98%   Cultures 2/14 Covid positive [facility]    Antimicrobials: Anti-infectives (From admission, onward)   Start     Dose/Rate Stop   07/02/19 1000  remdesivir 100 mg in sodium chloride 0.9 % 100 mL IVPB  Status:  Discontinued     100 mg 200 mL/hr over 30 Minutes 07/01/19 1727   07/02/19 1000  remdesivir 100 mg in sodium chloride 0.9 % 100 mL IVPB     100 mg 200 mL/hr over 30 Minutes 07/06/19 0959   07/01/19 1715  remdesivir 200 mg in sodium chloride 0.9% 250  mL IVPB  Status:  Discontinued     200 mg 580 mL/hr over 30 Minutes 07/01/19 1727       Devices    LINES / TUBES:      Continuous Infusions: . remdesivir 100 mg in NS 100 mL 100 mg (07/03/19 1023)     Objective: Vitals:   07/03/19 0800 07/03/19 1815 07/03/19 1909 07/04/19 0350  BP: 137/88 127/71 (!) 151/77 (!) 164/76   Pulse: 72 94 (!) 102 76  Resp: 18 18 18 18   Temp: 98.2 F (36.8 C) 98.2 F (36.8 C) 99.5 F (37.5 C) 97.9 F (36.6 C)  TempSrc: Oral Oral Oral Oral  SpO2: 90% 100% 98% 96%  Weight:      Height:        Intake/Output Summary (Last 24 hours) at 07/04/2019 0820 Last data filed at 07/04/2019 0500 Gross per 24 hour  Intake 955 ml  Output 800 ml  Net 155 ml   Filed Weights   07/01/19 1705  Weight: 98 kg   Physical Exam:  General: A/O x4, EXTREMELY hard of hearing, positive acute respiratory distress Eyes: negative scleral hemorrhage, negative anisocoria, negative icterus ENT: Negative Runny nose, negative gingival bleeding, Neck:  Negative scars, masses, torticollis, lymphadenopathy, JVD Lungs: Clear to auscultation bilaterally without wheezes or crackles Cardiovascular: Regular rate and rhythm without murmur gallop or rub normal S1 and S2 Abdomen: negative abdominal pain, nondistended, positive soft, bowel sounds, no rebound, no ascites, no appreciable mass Extremities: No significant cyanosis, clubbing, or edema bilateral lower extremities Skin: Negative rashes, lesions, ulcers Psychiatric:  Negative depression, negative anxiety, negative fatigue, negative mania  Central nervous system:  Cranial nerves II through XII intact, tongue/uvula midline, all extremities muscle strength 5/5, sensation intact throughout, negative dysarthria, negative expressive aphasia, negative receptive aphasia.  .     Data Reviewed: Care during the described time interval was provided by me .  I have reviewed this patient's available data, including medical history, events of note, physical examination, and all test results as part of my evaluation.   CBC: Recent Labs  Lab 07/01/19 1839 07/03/19 0755 07/04/19 0210  WBC 5.0 9.4 8.7  NEUTROABS 3.2 7.9* 7.4  HGB 12.2* 13.8 13.1  HCT 35.5* 42.1 39.0  MCV 83.9 87.7 86.1  PLT 226 228 [id number]   Basic Metabolic Panel: Recent Labs  Lab  07/01/19 1839 07/02/19 0255 07/03/19 0209 07/03/19 0755 07/04/19 0210  NA 135 135 137  --  136  K 4.5 4.4 4.3  --  4.3  CL 101 101 103  --  103  CO2 21* 18* 22  --  23  GLUCOSE 327* 345* 182*  --  243*  BUN 38* 40* 53*  --  57*  CREATININE 1.71* 1.59* 1.52*  --  1.40*  CALCIUM 9.1 8.9 8.9  --  8.6*  MG  --   --   --  1.8  --   PHOS  --   --   --  3.3 2.8   GFR: Estimated Creatinine Clearance: 41 mL/min (A) (by C-G formula based on SCr of 1.4 mg/dL (H)). Liver Function Tests: Recent Labs  Lab 07/01/19 1839 07/02/19 0255 07/03/19 0209 07/04/19 0210  AST 54* 67* 66* 51*  ALT 32 34 34 35  ALKPHOS 61 62 67 65  BILITOT 0.8 0.8 0.6 0.6  PROT 6.8 6.7 7.3 6.6  ALBUMIN 3.3* 3.6 3.7 3.3*   No results for input(s): LIPASE, AMYLASE in the last 168 hours. No results  for input(s): AMMONIA in the last 168 hours. Coagulation Profile: No results for input(s): INR, PROTIME in the last 168 hours. Cardiac Enzymes: No results for input(s): CKTOTAL, CKMB, CKMBINDEX, TROPONINI in the last 168 hours. BNP (last 3 results) No results for input(s): PROBNP in the last 8760 hours. HbA1C: Recent Labs    07/01/19 1839  HGBA1C 8.3*   CBG: Recent Labs  Lab 07/03/19 0755 07/03/19 1259 07/03/19 1721 07/03/19 2000 07/04/19 0725  GLUCAP 226* 390* 244* 257* 265*   Lipid Profile: Recent Labs    07/04/19 0210  CHOL 138  HDL 36*  LDLCALC 95  TRIG 35  CHOLHDL 3.8   Thyroid Function Tests: No results for input(s): TSH, T4TOTAL, FREET4, T3FREE, THYROIDAB in the last 72 hours. Anemia Panel: Recent Labs    07/03/19 0209 07/04/19 0210  FERRITIN 44 39   Urine analysis: No results found for: COLORURINE, APPEARANCEUR, LABSPEC, PHURINE, GLUCOSEU, HGBUR, BILIRUBINUR, KETONESUR, PROTEINUR, UROBILINOGEN, NITRITE, LEUKOCYTESUR Sepsis Labs: @LABRCNTIP (procalcitonin:4,lacticidven:4)  )No results found for this or any previous visit (from the past 240 hour(s)).       Radiology  Studies: No results found.      Scheduled Meds: . amLODipine  5 mg Oral Daily  . apixaban  5 mg Oral BID  . vitamin C  500 mg Oral Daily  . aspirin  81 mg Oral Daily  . chlorpheniramine-HYDROcodone  5 mL Oral Q12H  . dexamethasone (DECADRON) injection  6 mg Intravenous Q24H  . feeding supplement (ENSURE ENLIVE)  237 mL Oral TID BM  . finasteride  5 mg Oral Daily  . insulin aspart  0-15 Units Subcutaneous TID WC  . insulin aspart  8 Units Subcutaneous TID WC  . insulin detemir  20 Units Subcutaneous BID  . Ipratropium-Albuterol  1 puff Inhalation QID  . mirabegron ER  50 mg Oral QHS  . PARoxetine  30 mg Oral Daily  . zinc sulfate  220 mg Oral Daily   Continuous Infusions: . remdesivir 100 mg in NS 100 mL 100 mg (07/03/19 1023)     LOS: 3 days   The patient is critically ill with multiple organ systems failure and requires high complexity decision making for assessment and support, frequent evaluation and titration of therapies, application of advanced monitoring technologies and extensive interpretation of multiple databases. Critical Care Time devoted to patient care services described in this note  Time spent: 40 minutes    Mariela Rex, Geraldo Docker, MD [facility] Pager [phone]  If 7PM-7AM, please contact night-coverage [email or web address] Password [facility] 07/04/2019, 8:20 AM

## 2019-07-04 NOTE — Progress Notes (Signed)
Occupational Therapy Evaluation Patient Details Name: David Steele MRN: [id number] DOB: 12-Jan-1926 Today's Date: 07/04/2019    History of Present Illness 86 y.o.malewith medical history significant ofhypertension, dyslipidemia, type 2 diabetes mellitus, hypoacusia and prostate cancer. Patient reported for 4 days of not feeling well, generalized weakness, dry cough, and dyspnea. His predominant symptom was weakness, no improving factors, worse with exertion, associated with fevers but no chills. He had difficulty ambulating and poor appetite. [facility] COVID-19 viral pneumonia. On February 15 he was transferred to [facility] for further management.   Clinical Impression   Patient is kind but restless/nervous, likely because he is very HOH and does not always know what is happening when people come in his room.  Used pen and paper to convey plan of session and questions.  Patient lives in a home (unclear if he lives with son or if son is readily available and lives close by, helping him when needed) and was independent prior.  Has a walker at home but does not use it often.  Today patient was min guard with mobility as he is quite shakey and unsteady initially.  Able to complete standing ADLs with min guard. Patient on 1L at rest with SpO2 98.  With mobility patient able to stay on 1L and kept SpO2>95, but was short of breath after standing activity.  Will continue to follow with OT acutely to address the deficits listed below and improve independence for safe discharge.      Follow Up Recommendations  Home health OT;Supervision - Intermittent    Equipment Recommendations  None recommended by OT    Recommendations for Other Services       Precautions / Restrictions Precautions Precautions: Fall Precaution Comments: extremely HOH Restrictions Weight  Bearing Restrictions: No      Mobility Bed Mobility Overal bed mobility: Needs Assistance Bed Mobility: Supine to Sit;Sit to Supine     Supine to sit: Supervision Sit to supine: Supervision      Transfers Overall transfer level: Needs assistance Equipment used: Rolling walker (2 wheeled) Transfers: Sit to/from [employer] Sit to Stand: Min guard Stand pivot transfers: Min guard       General transfer comment: pt highly impulsive needing continued cues visual and tactile for safety    Balance Overall balance assessment: Needs assistance Sitting-balance support: Feet supported Sitting balance-Leahy Scale: Good     Standing balance support: During functional activity;Bilateral upper extremity supported Standing balance-Leahy Scale: Fair                             ADL either performed or assessed with clinical judgement   ADL Overall ADL's : Needs assistance/impaired Eating/Feeding: Set up;Sitting   Grooming: Wash/dry hands;Wash/dry face;Min guard;Standing   Upper Body Bathing: Min guard;Standing   Lower Body Bathing: Sit to/from stand;Minimal assistance   Upper Body Dressing : Set up;Sitting   Lower Body Dressing: Minimal assistance;Sit to/from stand   Toilet Transfer: Min guard;Ambulation;RW           Functional mobility during ADLs: Min guard;Rolling walker General ADL Comments: Patient is shakey when walking until he steadies himself on walker.       Vision         Perception     Praxis      Pertinent Vitals/Pain Pain Assessment: No/denies pain     Hand  Dominance     Extremity/Trunk Assessment Upper Extremity Assessment Upper Extremity Assessment: Generalized weakness           Communication Communication Communication: HOH   Cognition Arousal/Alertness: Awake/alert Behavior During Therapy: Restless;Impulsive Overall Cognitive Status: Difficult to assess                                  General Comments: Patient able to read questions and answer them, but difficult to fully assess due to extreme [facility].   General Comments       Exercises Exercises: Other exercises Other Exercises Other Exercises: completed flutter valve x 10 Other Exercises: x10 incentive spirometer   Shoulder Instructions      Home Living Family/patient expects to be discharged to:: Private residence Living Arrangements: Children Available Help at Discharge: Family Type of Home: House Home Access: Ramped entrance;Other (comment)(Ramp in back, does not use front steps)     Home Layout: One level     Bathroom Shower/Tub: Walk-in shower         Home Equipment: Environmental consultant - 2 wheels;Shower seat - built in          Prior Functioning/Environment Level of Independence: Independent        Comments: Patient states he was independent but that his son helps him sometimes. Unclear if he lives with son or if son checks on him often. Has walker at home but says he does not often use it.        OT Problem List: Decreased strength;Decreased activity tolerance;Impaired balance (sitting and/or standing);Decreased safety awareness;Cardiopulmonary status limiting activity      OT Treatment/Interventions: Self-care/ADL training;Therapeutic exercise;Energy conservation;Therapeutic activities;Balance training;Patient/family education    OT Goals(Current goals can be found in the care plan section) Acute Rehab OT Goals Patient Stated Goal: to go home OT Goal Formulation: With patient Time For Goal Achievement: 07/18/19 Potential to Achieve Goals: Good  OT Frequency: Min 3X/week   Barriers to D/C:            Co-evaluation              AM-PAC OT "6 Clicks" Daily Activity     Outcome Measure Help from another person eating meals?: None Help from another person taking care of personal grooming?: A Little Help from another person toileting, which includes using toliet, bedpan, or urinal?: A  Little Help from another person bathing (including washing, rinsing, drying)?: A Little Help from another person to put on and taking off regular upper body clothing?: A Little Help from another person to put on and taking off regular lower body clothing?: A Little 6 Click Score: 19   End of Session Equipment Utilized During Treatment: Gait belt;Rolling walker;Oxygen Nurse Communication: Mobility status  Activity Tolerance: Patient tolerated treatment well Patient left: in chair;with call bell/phone within reach;with chair alarm set  OT Visit Diagnosis: Unsteadiness on feet (R26.81);Other abnormalities of gait and mobility (R26.89);Muscle weakness (generalized) (M62.81)                Time: [id number] OT Time Calculation (min): 40 min Charges:  OT General Charges $OT Visit: 1 Visit OT Evaluation $OT Eval Moderate Complexity: 1 Mod OT Treatments $Self Care/Home Management : 23-37 mins  August Luz, OTR/L   Phylliss Bob 07/04/2019, 2:28 PM

## 2019-07-04 NOTE — Progress Notes (Signed)
Nutrition Consult Brief Note  Received consult for diabetes diet education. Patient is not appropriate for diabetes education due to advanced age, acute catabolic illness with difficulty breathing. Unable to reach patient by phone. Noted that he is HOH, so doubt education over the phone would be an appropriate option. Will attach DM education materials to D/C instructions.   RD is following for poor oral intake. He consumed 75% of breakfast and lunch yesterday, which is an improvement. PO supplements have been ordered. RD to continue to follow and assist with maximizing intake.   Molli Barrows, RD, LDN, CNSC Please refer to [facility] for contact information.

## 2019-07-04 NOTE — Progress Notes (Signed)
PHYSICAL THERAPY PROGRESS NOTE  Pt did much better today with mobility, he was slightly less impulsive and better able to follow cues. He also ambulated greater distance with increased independence. Pt was on 1L/min via [place] at rest and sats in 90s with ambulation increased to 2L/min and pt was able to stay in 90s.    07/04/19 1501  PT Visit Information  Last PT Received On 07/04/19  Assistance Needed +1  History of Present Illness 86 y.o.malewith medical history significant ofhypertension, dyslipidemia, type 2 diabetes mellitus, hypoacusia and prostate cancer. Patient reported for 4 days of not feeling well, generalized weakness, dry cough, and dyspnea. His predominant symptom was weakness, no improving factors, worse with exertion, associated with fevers but no chills. He had difficulty ambulating and poor appetite. Due [facility] persistent [facility] with SARS COVID-19 viral pneumonia. On February 15 he was transferred to [facility] for further management.  Subjective Data  Patient Stated Goal to go home  Precautions  Precautions Fall  Precaution Comments extremely HOH  Restrictions  Weight Bearing Restrictions No  Pain Assessment  Pain Assessment No/denies pain  Cognition  Arousal/Alertness Awake/alert  Behavior During Therapy WFL for tasks assessed/performed  Overall Cognitive Status No family/caregiver present to determine baseline cognitive functioning  Bed Mobility  Overal bed mobility Needs Assistance  Bed Mobility Sit to Supine  Supine to sit Supervision  Transfers  Overall transfer level Needs assistance  Equipment used Rolling walker (2 wheeled)  Transfers Sit to/from [employer] Transfers  Sit to Stand Min guard  Stand pivot transfers Min guard  Ambulation/Gait  Ambulation/Gait assistance Min guard  Gait Distance (Feet) 200 Feet  Assistive device Rolling walker (2  wheeled)  Gait Pattern/deviations Step-through pattern;Wide base of support;Ataxic  Gait velocity decreased  Balance  Overall balance assessment Needs assistance  Sitting-balance support Feet supported  Sitting balance-Leahy Scale Good  Standing balance support During functional activity;Bilateral upper extremity supported  Standing balance-Leahy Scale Fair  PT - End of Session  Equipment Utilized During Treatment Oxygen;Gait belt  Activity Tolerance Patient limited by fatigue;Patient limited by lethargy  Patient left in bed;with call bell/phone within reach  Nurse Communication Mobility status   PT - Assessment/Plan  PT Plan Current plan remains appropriate  PT Visit Diagnosis Unsteadiness on feet (R26.81);Other abnormalities of gait and mobility (R26.89);Muscle weakness (generalized) (M62.81);Ataxic gait (R26.0)  PT Frequency (ACUTE ONLY) Min 3X/week  Follow Up Recommendations Home health PT  PT equipment Rolling walker with 5" wheels  AM-PAC PT "6 Clicks" Mobility Outcome Measure (Version 2)  Help needed turning from your back to your side while in a flat bed without using bedrails? 4  Help needed moving from lying on your back to sitting on the side of a flat bed without using bedrails? 4  Help needed moving to and from a bed to a chair (including a wheelchair)? 3  Help needed standing up from a chair using your arms (e.g., wheelchair or bedside chair)? 3  Help needed to walk in hospital room? 3  Help needed climbing 3-5 steps with a railing?  2  6 Click Score 19  Consider Recommendation of Discharge To: Home with [facility]  PT Goal Progression  Progress towards PT goals Progressing toward goals  Acute Rehab PT Goals  Time For Goal Achievement 07/17/19  Potential to Achieve Goals Good  PT Time Calculation  PT Start Time (ACUTE ONLY) 1428  PT Stop Time (ACUTE  ONLY) 1501  PT Time Calculation (min) (ACUTE ONLY) 33 min  PT Treatments  $Gait Training 8-22 mins  $Therapeutic Activity  8-22 mins   Horald Chestnut, PT

## 2019-07-04 NOTE — Progress Notes (Signed)
Uneventful shift. Only requiring 1 liter of oxygen at this time. No respiratory or cardiac issues this shift. Ambulated with physical therapy without issue (see PT note). Confirmed with the son that his hearing aids are at home. Pt is resting comfortably in bed. Call bell within reach. Will continue to monitor.

## 2019-07-05 ENCOUNTER — Other Ambulatory Visit: Payer: Self-pay

## 2019-07-05 ENCOUNTER — Encounter (HOSPITAL_COMMUNITY): Payer: Self-pay

## 2019-07-05 LAB — D-DIMER, QUANTITATIVE: D-Dimer, Quant: 2.14 ug/mL-FEU — ABNORMAL HIGH (ref 0.00–0.50)

## 2019-07-05 LAB — CBC WITH DIFFERENTIAL/PLATELET
Abs Immature Granulocytes: 0.03 10*3/uL (ref 0.00–0.07)
Basophils Absolute: 0 10*3/uL (ref 0.0–0.1)
Basophils Relative: 0 %
Eosinophils Absolute: 0 10*3/uL (ref 0.0–0.5)
Eosinophils Relative: 0 %
HCT: 39.3 % (ref 39.0–52.0)
Hemoglobin: 13.1 g/dL (ref 13.0–17.0)
Immature Granulocytes: 0 %
Lymphocytes Relative: 13 %
Lymphs Abs: 1.2 10*3/uL (ref 0.7–4.0)
MCH: 28.7 pg (ref 26.0–34.0)
MCHC: 33.3 g/dL (ref 30.0–36.0)
MCV: 86 fL (ref 80.0–100.0)
Monocytes Absolute: 0.7 10*3/uL (ref 0.1–1.0)
Monocytes Relative: 8 %
Neutro Abs: 7.6 10*3/uL (ref 1.7–7.7)
Neutrophils Relative %: 79 %
Platelets: 275 10*3/uL (ref 150–400)
RBC: 4.57 MIL/uL (ref 4.22–5.81)
RDW: 16.3 % — ABNORMAL HIGH (ref 11.5–15.5)
WBC: 9.6 10*3/uL (ref 4.0–10.5)
nRBC: 0 % (ref 0.0–0.2)

## 2019-07-05 LAB — COMPREHENSIVE METABOLIC PANEL
ALT: 34 U/L (ref 0–44)
AST: 37 U/L (ref 15–41)
Albumin: 3.3 g/dL — ABNORMAL LOW (ref 3.5–5.0)
Alkaline Phosphatase: 66 U/L (ref 38–126)
Anion gap: 9 (ref 5–15)
BUN: 54 mg/dL — ABNORMAL HIGH (ref 8–23)
CO2: 26 mmol/L (ref 22–32)
Calcium: 9 mg/dL (ref 8.9–10.3)
Chloride: 104 mmol/L (ref 98–111)
Creatinine, Ser: 1.23 mg/dL (ref 0.61–1.24)
GFR calc Af Amer: 59 mL/min — ABNORMAL LOW (ref >60)
GFR calc non Af Amer: 51 mL/min — ABNORMAL LOW (ref >60)
Glucose, Bld: 297 mg/dL — ABNORMAL HIGH (ref 70–99)
Potassium: 4.6 mmol/L (ref 3.5–5.1)
Sodium: 139 mmol/L (ref 135–145)
Total Bilirubin: 0.4 mg/dL (ref 0.3–1.2)
Total Protein: 6.6 g/dL (ref 6.5–8.1)

## 2019-07-05 LAB — GLUCOSE, CAPILLARY
Glucose-Capillary: 205 mg/dL — ABNORMAL HIGH (ref 70–99)
Glucose-Capillary: 221 mg/dL — ABNORMAL HIGH (ref 70–99)
Glucose-Capillary: 227 mg/dL — ABNORMAL HIGH (ref 70–99)
Glucose-Capillary: 321 mg/dL — ABNORMAL HIGH (ref 70–99)
Glucose-Capillary: 344 mg/dL — ABNORMAL HIGH (ref 70–99)
Glucose-Capillary: 450 mg/dL — ABNORMAL HIGH (ref 70–99)

## 2019-07-05 LAB — C-REACTIVE PROTEIN: CRP: 0.9 mg/dL (ref <1.0)

## 2019-07-05 LAB — FERRITIN: Ferritin: 39 ng/mL (ref 24–336)

## 2019-07-05 LAB — PHOSPHORUS: Phosphorus: 1.6 mg/dL — ABNORMAL LOW (ref 2.5–4.6)

## 2019-07-05 MED ORDER — INSULIN DETEMIR 100 UNIT/ML ~~LOC~~ SOLN
35.0000 [IU] | Freq: Two times a day (BID) | SUBCUTANEOUS | Status: DC
  Administered 2019-07-05 – 2019-07-06 (×2): 35 [IU] via SUBCUTANEOUS
  Filled 2019-07-05 (×3): qty 0.35

## 2019-07-05 MED ORDER — INSULIN ASPART 100 UNIT/ML ~~LOC~~ SOLN
20.0000 [IU] | Freq: Three times a day (TID) | SUBCUTANEOUS | Status: DC
  Administered 2019-07-05 – 2019-07-06 (×4): 20 [IU] via SUBCUTANEOUS

## 2019-07-05 MED ORDER — INSULIN ASPART 100 UNIT/ML ~~LOC~~ SOLN
0.0000 [IU] | SUBCUTANEOUS | Status: DC
  Administered 2019-07-05: 18:00:00 7 [IU] via SUBCUTANEOUS
  Administered 2019-07-05: 13:00:00 20 [IU] via SUBCUTANEOUS
  Administered 2019-07-05: 20:00:00 7 [IU] via SUBCUTANEOUS
  Administered 2019-07-06: 15 [IU] via SUBCUTANEOUS
  Administered 2019-07-06: 08:00:00 3 [IU] via SUBCUTANEOUS

## 2019-07-05 NOTE — Progress Notes (Addendum)
PROGRESS NOTE    David Steele  [id number] DOB: 1926/03/13 DOA: 07/01/2019 PCP: Mathews Argyle, MD   Brief Narrative:  David Steele a 86 y.o.malePMHx HTN, HLD, DM type 2 diabetes mellitus complication, hypoacusia and prostate cancer.  Patient reported for 4 days of not feeling well, generalized weakness, dry cough, and dyspnea. His predominant symptom was weakness, no improving factors, worse with exertion, associated with fevers but no chills. He had difficulty ambulating and poor appetite.  Due [facility] persistent [facility] with SARS COVID-19 viral pneumonia. His oximetry was 92% on room air and on ambulation 87%. He underwent further work-up with CT chest which was negative for pulmonary embolus,his white count was 7.1, hemoglobin 13.2, sodium 135, potassium 4.5, chloride 98, bicarb 25, BUN 20, creatinine 1.50, glucose 275.   On February 15 he was transferred to [facility] for further management.  At the time of transfer he had moderate dyspnea, worse with exertion, no improving factors. He continued to feel weak but not frank fatigue.  Patient has been responding well to medical therapy.    Subjective: 2/19 afebrile last 24 hours negative CP, positive S OB.  Patient eating and appropriate food which has been brought him, causing his CBGs to be elevated   Assessment & Plan:   Principal Problem:   Pneumonia due to COVID-19 virus Active Problems:   Hypertension   T2DM (type 2 diabetes mellitus) ([place])   Dyslipidemia   Acute respiratory failure with hypoxia ([place])   Diabetes mellitus type 2, uncontrolled, with complications ([place])   Prostate cancer ([place])   CKD (chronic kidney disease), stage III   HLD (hyperlipidemia)  Covid pneumonia/Acute Respiratory failure with hypoxia COVID-19 Labs  Recent Labs    07/03/19 0209 07/04/19 0210 07/05/19 0102    DDIMER 1.48* 1.62* 2.14*  FERRITIN 44 39 39  CRP 2.2* 1.3* 0.9     2/14 Covid positive [facility]  -Decadron 6 mg daily -Remdesivir Per pharmacy protocol -Vitamins per Covid protocol -Combivent -Titrate O2 to maintain SPO2> 88% -Prone 16 hours/day, if patient cannot tolerate prone 2 to 3 hours per shift -2/19 ambulatory SPO2 pending  Essential HTN -Amlodipine 5 mg daily -Hydralazine PRN  DM type II uncontrolled with complication -[id number] hemoglobin A1c= 8.3 -2/17 consult to diabetic coordinator -2/17 consult to diabetic nutrition -2/19 increase Levemir 35 units BID  -2/19 increase NovoLog 20 units qac -Resistant SSI RN to ensure the following is not on patient's food trays  NO sweet drinks; orange juice, fruit drinks, apple juice, grape juice, sodas etc.  NO complex carbohydrates  NO graham crackers, cookies, peanut butter, cereal, jelly  HLD -2/18 LDL = 95 -2/18 Lipitor 40 mg daily  CKD stage III (baseline Cr 1.71) Recent Labs  Lab 07/01/19 1839 07/02/19 0255 07/03/19 0209 07/04/19 0210 07/05/19 0102  CREATININE 1.71* 1.59* 1.52* 1.40* 1.23  -At baseline  Goals of care -PT/OT; recommend home health    DVT prophylaxis: Apixaban Code Status: Full Family Communication: 2/19 spoke with Roger's wife explained plan of care answered all questions Disposition Plan:    Consultants:    Procedures/Significant Events:     I have personally reviewed and interpreted all radiology studies and my findings are as above.  VENTILATOR SETTINGS: Room air 2/19 SPO2; 98%   Cultures 2/14 Covid positive [facility]    Antimicrobials: Anti-infectives (From admission, onward)   Start     Dose/Rate Stop   07/02/19 1000  remdesivir 100 mg in sodium chloride 0.9 % 100 mL IVPB  Status:  Discontinued     100 mg 200 mL/hr over 30 Minutes 07/01/19 1727   07/02/19 1000  remdesivir 100 mg in sodium chloride 0.9 % 100 mL IVPB     100 mg 200 mL/hr over 30  Minutes 07/06/19 0959   07/01/19 1715  remdesivir 200 mg in sodium chloride 0.9% 250 mL IVPB  Status:  Discontinued     200 mg 580 mL/hr over 30 Minutes 07/01/19 [place] / TUBES:      Continuous Infusions: . remdesivir 100 mg in NS 100 mL 100 mg (07/04/19 0922)     Objective: Vitals:   07/04/19 0720 07/04/19 1915 07/05/19 0359 07/05/19 0721  BP:  (!) 148/96 (!) 155/84 126/78  Pulse:  98 80 82  Resp: 20 20 20 20   Temp: 98.1 F (36.7 C) 98.8 F (37.1 C) 97.8 F (36.6 C) 98.5 F (36.9 C)  TempSrc: Oral Oral Oral Oral  SpO2:  98% 99% 99%  Weight:      Height:        Intake/Output Summary (Last 24 hours) at 07/05/2019 0844 Last data filed at 07/05/2019 [id number] Gross per 24 hour  Intake 1970 ml  Output 1475 ml  Net 495 ml   Filed Weights   07/01/19 1705  Weight: 98 kg   Physical Exam:  General: A/O x4, EXTREMELY hard of hearing, no acute respiratory distress Eyes: negative scleral hemorrhage, negative anisocoria, negative icterus ENT: Negative Runny nose, negative gingival bleeding, Neck:  Negative scars, masses, torticollis, lymphadenopathy, JVD Lungs: Clear to auscultation bilaterally without wheezes or crackles Cardiovascular: Regular rate and rhythm without murmur gallop or rub normal S1 and S2 Abdomen: negative abdominal pain, nondistended, positive soft, bowel sounds, no rebound, no ascites, no appreciable mass Extremities: No significant cyanosis, clubbing, or edema bilateral lower extremities Skin: Negative rashes, lesions, ulcers Psychiatric:  Negative depression, negative anxiety, negative fatigue, negative mania  Central nervous system:  Cranial nerves II through XII intact, tongue/uvula midline, all extremities muscle strength 5/5, sensation intact throughout, negative dysarthria, negative expressive aphasia, negative receptive aphasia.  .     Data Reviewed: Care during the described time interval was provided by me .  I have  reviewed this patient's available data, including medical history, events of note, physical examination, and all test results as part of my evaluation.   CBC: Recent Labs  Lab 07/01/19 1839 07/03/19 0755 07/04/19 0210 07/05/19 0102  WBC 5.0 9.4 8.7 9.6  NEUTROABS 3.2 7.9* 7.4 7.6  HGB 12.2* 13.8 13.1 13.1  HCT 35.5* 42.1 39.0 39.3  MCV 83.9 87.7 86.1 86.0  PLT 226 228 252 [id number]   Basic Metabolic Panel: Recent Labs  Lab 07/01/19 1839 07/02/19 0255 07/03/19 0209 07/03/19 0755 07/04/19 0210 07/05/19 0102  NA 135 135 137  --  136 139  K 4.5 4.4 4.3  --  4.3 4.6  CL 101 101 103  --  103 104  CO2 21* 18* 22  --  23 26  GLUCOSE 327* 345* 182*  --  243* 297*  BUN 38* 40* 53*  --  57* 54*  CREATININE 1.71* 1.59* 1.52*  --  1.40* 1.23  CALCIUM 9.1 8.9 8.9  --  8.6* 9.0  MG  --   --   --  1.8  --   --   PHOS  --   --   --  3.3 2.8 1.6*   GFR: Estimated Creatinine Clearance: 46.7 mL/min (by C-G formula based on SCr of 1.23 mg/dL). Liver Function Tests: Recent Labs  Lab 07/01/19 1839 07/02/19 0255 07/03/19 0209 07/04/19 0210 07/05/19 0102  AST 54* 67* 66* 51* 37  ALT 32 34 34 35 34  ALKPHOS 61 62 67 65 66  BILITOT 0.8 0.8 0.6 0.6 0.4  PROT 6.8 6.7 7.3 6.6 6.6  ALBUMIN 3.3* 3.6 3.7 3.3* 3.3*   No results for input(s): LIPASE, AMYLASE in the last 168 hours. No results for input(s): AMMONIA in the last 168 hours. Coagulation Profile: No results for input(s): INR, PROTIME in the last 168 hours. Cardiac Enzymes: No results for input(s): CKTOTAL, CKMB, CKMBINDEX, TROPONINI in the last 168 hours. BNP (last 3 results) No results for input(s): PROBNP in the last 8760 hours. HbA1C: No results for input(s): HGBA1C in the last 72 hours. CBG: Recent Labs  Lab 07/04/19 1528 07/04/19 2003 07/04/19 2145 07/05/19 0044 07/05/19 0719  GLUCAP 344* 437* 462* 321* 221*   Lipid Profile: Recent Labs    07/04/19 0210  CHOL 138  HDL 36*  LDLCALC 95  TRIG 35  CHOLHDL 3.8    Thyroid Function Tests: No results for input(s): TSH, T4TOTAL, FREET4, T3FREE, THYROIDAB in the last 72 hours. Anemia Panel: Recent Labs    07/04/19 0210 07/05/19 0102  FERRITIN 39 39   Urine analysis: No results found for: COLORURINE, APPEARANCEUR, LABSPEC, PHURINE, GLUCOSEU, HGBUR, BILIRUBINUR, KETONESUR, PROTEINUR, UROBILINOGEN, NITRITE, LEUKOCYTESUR Sepsis Labs: @LABRCNTIP (procalcitonin:4,lacticidven:4)  )No results found for this or any previous visit (from the past 240 hour(s)).       Radiology Studies: No results found.      Scheduled Meds: . amLODipine  5 mg Oral Daily  . apixaban  5 mg Oral BID  . vitamin C  500 mg Oral Daily  . aspirin  81 mg Oral Daily  . atorvastatin  40 mg Oral q1800  . chlorpheniramine-HYDROcodone  5 mL Oral Q12H  . dexamethasone (DECADRON) injection  6 mg Intravenous Q24H  . feeding supplement (ENSURE ENLIVE)  237 mL Oral TID BM  . finasteride  5 mg Oral Daily  . insulin aspart  0-15 Units Subcutaneous TID WC  . insulin aspart  14 Units Subcutaneous TID WC  . insulin detemir  25 Units Subcutaneous BID  . Ipratropium-Albuterol  1 puff Inhalation QID  . mirabegron ER  50 mg Oral QHS  . PARoxetine  30 mg Oral Daily  . zinc sulfate  220 mg Oral Daily   Continuous Infusions: . remdesivir 100 mg in NS 100 mL 100 mg (07/04/19 0922)     LOS: 4 days   The patient is critically ill with multiple organ systems failure and requires high complexity decision making for assessment and support, frequent evaluation and titration of therapies, application of advanced monitoring technologies and extensive interpretation of multiple databases. Critical Care Time devoted to patient care services described in this note  Time spent: 40 minutes    Darnelle Derrick, Geraldo Docker, MD [facility] Pager [phone]  If 7PM-7AM, please contact night-coverage [email or web address] Password [facility] 07/05/2019, 8:44 AM

## 2019-07-05 NOTE — TOC Initial Note (Addendum)
Transition of Care David Steele) - Initial/Assessment Note    Patient Details  Name: David Steele MRN: [id number] Date of Birth: [birth year]  Transition of Care David Steele) CM/SW Contact:    David Ludwig, LCSW Phone Number: 07/05/2019, 4:16 PM  Clinical Narrative:                  Patient is a 86 year old male who is alert and oriented x4.  Patient was not able to hear the phone when CSW tried calling to complete assessment.  CSW spoke to patient's son David Steele [phone] to discuss PT recommendations for home health.  Per patient's son, he lives alone, but his son visits everyday, and every couple of weeks his sister stays with patient.  Patient has not had home health before, CSW explained to patient's son what to expect and asked if they had a preference for agency.  CSW named different agencies, and he said he did not have a preference.  CSW contacted [facility] and spoke to David Steele, she can accept patient for [facility] PT once he is ready for discharge.  CSW to notify David Steele once patient is ready for discharge.  Patient may need oxygen on dishcharge, however at this time patient is on room air.  PT recommended a rolling walker, patient's son said he has one that patient can use so they do not need any other equipment.  Expected Discharge Plan: David Steele to Discharge: Continued Medical Work up   Patient Goals and CMS Choice Patient states their goals for this hospitalization and ongoing recovery are:: To return back home with home heatlh services. CMS Medicare.gov Compare Post Acute Care list provided to:: Patient Represenative (must comment) Choice offered to / list presented to : Adult Children  Expected Discharge Plan and Services Expected Discharge Plan: [place] Choice: David Steele arrangements for the past 2 months: Single Family Home                           HH Arranged: PT HH Agency: David Steele Date [place]: 07/05/19 Time HH Agency Contacted: David Steele Representative spoke with at [place]: [place] Arrangements/Services Living arrangements for the past 2 months: [place] with:: Self Patient language and need for interpreter reviewed:: Yes Do you feel safe going back to the place where you live?: Yes      Need for Family Participation in Patient Care: Yes (Comment) Care giver support system in place?: No (comment)   Criminal Activity/Legal Involvement Pertinent to Current Situation/Hospitalization: No - Comment as needed  Activities of Daily Living Home Assistive Devices/Equipment: Built-in shower seat, Walker (specify type) ADL Screening (condition at time of admission) Patient's cognitive ability adequate to safely complete daily activities?: Yes Is the patient deaf or have difficulty hearing?: Yes Does the patient have difficulty seeing, even when wearing glasses/contacts?: No Does the patient have difficulty concentrating, remembering, or making decisions?: No Patient able to express need for assistance with ADLs?: Yes Does the patient have difficulty dressing or bathing?: No Independently performs ADLs?: Yes (appropriate for developmental age) Does the patient have difficulty walking or climbing stairs?: Yes Weakness of Legs: Both Weakness of Arms/Hands: Both  Permission Sought/Granted Permission sought to share information with : Family Supports Permission granted to share information with : Yes, Verbal Permission Granted  Share Information with NAME: David Steele  Son [phone]  Permission granted to share info w AGENCY: [place]        Emotional Assessment Appearance:: Appears stated age   Affect (typically observed): Accepting, Appropriate Orientation: : Oriented to Self, Oriented to Place, Oriented to  Time, Oriented to Situation Alcohol / Substance Use: Not Applicable Psych Involvement: No  (comment)  Admission diagnosis:  Pneumonia due to COVID-19 virus [U07.1, J12.82] Patient Active Problem List   Diagnosis Date Noted  . Acute respiratory failure with hypoxia ([place]) 07/03/2019  . Diabetes mellitus type 2, uncontrolled, with complications ([place]) [id number]  . Prostate cancer ([place]) 07/03/2019  . CKD (chronic kidney disease), stage III 07/03/2019  . HLD (hyperlipidemia) 07/03/2019  . Pneumonia due to COVID-19 virus 07/01/2019  . T2DM (type 2 diabetes mellitus) ([place]) 07/01/2019  . Dyslipidemia 07/01/2019  . Hypertension 08/31/2010  . Orthostatic hypotension 08/31/2010   PCP:  Mathews Argyle, MD Pharmacy:  No Pharmacies Listed    Social Determinants of Health (SDOH) Interventions    Readmission Risk Interventions No flowsheet data found.

## 2019-07-06 ENCOUNTER — Encounter (HOSPITAL_COMMUNITY): Payer: Self-pay | Admitting: Internal Medicine

## 2019-07-06 DIAGNOSIS — C61 Malignant neoplasm of prostate: Secondary | ICD-10-CM

## 2019-07-06 LAB — CBC WITH DIFFERENTIAL/PLATELET
Abs Immature Granulocytes: 0.08 10*3/uL — ABNORMAL HIGH (ref 0.00–0.07)
Basophils Absolute: 0 10*3/uL (ref 0.0–0.1)
Basophils Relative: 0 %
Eosinophils Absolute: 0 10*3/uL (ref 0.0–0.5)
Eosinophils Relative: 0 %
HCT: 40.6 % (ref 39.0–52.0)
Hemoglobin: 13.8 g/dL (ref 13.0–17.0)
Immature Granulocytes: 1 %
Lymphocytes Relative: 10 %
Lymphs Abs: 1.1 10*3/uL (ref 0.7–4.0)
MCH: 29 pg (ref 26.0–34.0)
MCHC: 34 g/dL (ref 30.0–36.0)
MCV: 85.3 fL (ref 80.0–100.0)
Monocytes Absolute: 0.7 10*3/uL (ref 0.1–1.0)
Monocytes Relative: 6 %
Neutro Abs: 9.4 10*3/uL — ABNORMAL HIGH (ref 1.7–7.7)
Neutrophils Relative %: 83 %
Platelets: 258 10*3/uL (ref 150–400)
RBC: 4.76 MIL/uL (ref 4.22–5.81)
RDW: 16.5 % — ABNORMAL HIGH (ref 11.5–15.5)
WBC: 11.2 10*3/uL — ABNORMAL HIGH (ref 4.0–10.5)
nRBC: 0 % (ref 0.0–0.2)

## 2019-07-06 LAB — COMPREHENSIVE METABOLIC PANEL
ALT: 35 U/L (ref 0–44)
AST: 34 U/L (ref 15–41)
Albumin: 3.4 g/dL — ABNORMAL LOW (ref 3.5–5.0)
Alkaline Phosphatase: 61 U/L (ref 38–126)
Anion gap: 11 (ref 5–15)
BUN: 49 mg/dL — ABNORMAL HIGH (ref 8–23)
CO2: 24 mmol/L (ref 22–32)
Calcium: 9.1 mg/dL (ref 8.9–10.3)
Chloride: 105 mmol/L (ref 98–111)
Creatinine, Ser: 1.12 mg/dL (ref 0.61–1.24)
GFR calc Af Amer: >60 mL/min (ref >60)
GFR calc non Af Amer: 57 mL/min — ABNORMAL LOW (ref >60)
Glucose, Bld: 40 mg/dL — CL (ref 70–99)
Potassium: 3.8 mmol/L (ref 3.5–5.1)
Sodium: 140 mmol/L (ref 135–145)
Total Bilirubin: 0.4 mg/dL (ref 0.3–1.2)
Total Protein: 6.8 g/dL (ref 6.5–8.1)

## 2019-07-06 LAB — C-REACTIVE PROTEIN: CRP: 0.7 mg/dL (ref <1.0)

## 2019-07-06 LAB — PHOSPHORUS: Phosphorus: 1.9 mg/dL — ABNORMAL LOW (ref 2.5–4.6)

## 2019-07-06 LAB — GLUCOSE, CAPILLARY
Glucose-Capillary: 138 mg/dL — ABNORMAL HIGH (ref 70–99)
Glucose-Capillary: 153 mg/dL — ABNORMAL HIGH (ref 70–99)
Glucose-Capillary: 320 mg/dL — ABNORMAL HIGH (ref 70–99)
Glucose-Capillary: 76 mg/dL (ref 70–99)

## 2019-07-06 LAB — FERRITIN: Ferritin: 35 ng/mL (ref 24–336)

## 2019-07-06 LAB — D-DIMER, QUANTITATIVE: D-Dimer, Quant: 1.72 ug/mL-FEU — ABNORMAL HIGH (ref 0.00–0.50)

## 2019-07-06 LAB — MAGNESIUM: Magnesium: 1.7 mg/dL (ref 1.7–2.4)

## 2019-07-06 MED ORDER — ASCORBIC ACID 500 MG PO TABS: 500.0000 mg | ORAL_TABLET | Freq: Every day | ORAL | 0 refills | Status: DC

## 2019-07-06 MED ORDER — ZINC SULFATE 220 (50 ZN) MG PO CAPS: 220.0000 mg | ORAL_CAPSULE | Freq: Every day | ORAL | 0 refills | Status: DC

## 2019-07-06 MED ORDER — IPRATROPIUM-ALBUTEROL 20-100 MCG/ACT IN AERS: 1.0000 | INHALATION_SPRAY | Freq: Four times a day (QID) | RESPIRATORY_TRACT | 0 refills | Status: DC

## 2019-07-06 MED ORDER — DEXAMETHASONE 6 MG PO TABS: 6.0000 mg | ORAL_TABLET | Freq: Every day | ORAL | 0 refills | Status: DC

## 2019-07-06 MED ORDER — ASPIRIN 81 MG PO TABS: 81.0000 mg | ORAL_TABLET | Freq: Every day | ORAL | 0 refills | Status: DC

## 2019-07-06 MED ORDER — ONDANSETRON HCL 4 MG PO TABS: 4.0000 mg | ORAL_TABLET | Freq: Four times a day (QID) | ORAL | 0 refills | Status: DC | PRN

## 2019-07-06 MED ORDER — GUAIFENESIN-DM 100-10 MG/5ML PO SYRP: 10.0000 mL | ORAL_SOLUTION | ORAL | 0 refills | Status: DC | PRN

## 2019-07-06 MED ORDER — DEXTROSE 50 % IV SOLN
INTRAVENOUS | Status: AC
  Administered 2019-07-06: 50 mL
  Filled 2019-07-06: qty 50

## 2019-07-06 MED ORDER — ATORVASTATIN CALCIUM 40 MG PO TABS: 40.0000 mg | ORAL_TABLET | Freq: Every day | ORAL | 0 refills | Status: DC

## 2019-07-06 MED ORDER — AMLODIPINE BESYLATE 5 MG PO TABS: 10.0000 mg | ORAL_TABLET | Freq: Every day | ORAL | 0 refills | Status: DC

## 2019-07-06 MED ORDER — MAGNESIUM SULFATE 2 GM/50ML IV SOLN
2.0000 g | Freq: Once | INTRAVENOUS | Status: AC
  Administered 2019-07-06: 09:00:00 2 g via INTRAVENOUS
  Filled 2019-07-06: qty 50

## 2019-07-06 NOTE — Discharge Summary (Addendum)
Physician Discharge Summary  Javiar Tuy [id number] DOB: 03/19/1926 DOA: 07/01/2019  PCP: Mathews Argyle, MD  Admit date: 07/01/2019 Discharge date: 07/06/2019  Time spent: 30 minutes  Recommendations for Outpatient Follow-up:   Covid pneumonia/Acute Respiratory failure with hypoxia COVID-19 Labs  Recent Labs    07/04/19 0210 07/05/19 0102 07/06/19 0010  DDIMER 1.62* 2.14* 1.72*  FERRITIN 39 39 35  CRP 1.3* 0.9 0.7    2/14 Covid positive [facility]  -Decadron 6 mg daily -Remdesivir Per pharmacy protocol -Vitamins per Covid protocol -Combivent SATURATION QUALIFICATIONS: (This note is used to comply with regulatory documentation for home oxygen) Patient Saturations on Room Air at Rest = 99% Patient Saturations on Room Air while Ambulating = 96% Patient Saturations on na Liters of oxygen while Ambulating = na% Please briefly explain why patient needs home oxygen: pt. Was a little sob on the way back from the walk, recovered quickly -Patient does not meet criteria for home O2. -Patient considered contagious until 3/7 will need to take appropriate actions to include wearing a mask,  Essential HTN -Amlodipine 10 mg daily -Would not restart lisinopril however will leave that up to PCP.  Patient's kidney function has improved without being on ACEI and Metformin.  DM type II uncontrolled with complication -[id number] hemoglobin A1c= 8.3 -2/17 consult to diabetic coordinator -2/17 consult to diabetic nutrition -Would not restart Metformin -Restart home insulin regimen  -Ensure the following is not consumed by patient             NO sweet drinks; orange juice, fruit drinks, apple juice, grape juice, sodas etc.             NO complex carbohydrates             NO graham crackers, cookies, peanut butter, cereal, jelly  HLD -2/18 LDL = 95 -2/18 Lipitor 40 mg daily  CKD stage III (baseline Cr 1.71) Recent Labs  Lab 07/02/19 0255 07/03/19 0209 07/04/19 0210  07/05/19 0102 07/06/19 0010  CREATININE 1.59* 1.52* 1.40* 1.23 1.12  -Below baseline CKD stage III has been ruled out  Hypomagnesmia -Magnesium goal> 2 -Magnesium IV 2 g     Discharge Diagnoses:  Principal Problem:   Pneumonia due to COVID-19 virus Active Problems:   Hypertension   T2DM (type 2 diabetes mellitus) ([place])   Dyslipidemia   Acute respiratory failure with hypoxia ([place])   Diabetes mellitus type 2, uncontrolled, with complications ([place])   Prostate cancer ([place])   CKD (chronic kidney disease), stage III   HLD (hyperlipidemia)   Discharge Condition: Stable  Diet recommendation: Carb modified  Filed Weights   07/01/19 1705  Weight: 98 kg    History of present illness:  David Steele a 86 y.o.malePMHx HTN, HLD, DM type 2 diabetes mellitus complication, hypoacusiaand prostate cancer.  Patient reported for 4 days of not feeling well, generalized weakness, dry cough, and dyspnea. His predominant symptom was weakness, no improving factors, worse with exertion, associated with fevers but no chills. He had difficulty ambulating and poor appetite.  Due [facility] persistent [facility] with SARS COVID-19 viral pneumonia. His oximetry was 92% on room air and on ambulation 87%. He underwent further work-up with CT chest which was negative for pulmonary embolus,his white count was 7.1, hemoglobin 13.2, sodium 135, potassium 4.5, chloride 98, bicarb 25, BUN 20, creatinine 1.50, glucose 275.   On February 15 [facility]  At the time of transfer he had moderatedyspnea,worse with exertion, no improving factors. He continued to feel weak but not frank fatigue.  Patient has been responding well to medical therapy.   Hospital Course:  Patient treated for Covid pneumonia/acute respiratory failure with hypoxia using Covid  protocol responded well stable for discharge.    Cultures  2/14 Covid positive [facility]    Antibiotics Anti-infectives (From admission, onward)   Start     Dose/Rate Stop   07/02/19 1000  remdesivir 100 mg in sodium chloride 0.9 % 100 mL IVPB  Status:  Discontinued     100 mg 200 mL/hr over 30 Minutes 07/01/19 1727   07/02/19 1000  remdesivir 100 mg in sodium chloride 0.9 % 100 mL IVPB     100 mg 200 mL/hr over 30 Minutes 07/05/19 1046   07/01/19 1715  remdesivir 200 mg in sodium chloride 0.9% 250 mL IVPB  Status:  Discontinued     200 mg 580 mL/hr over 30 Minutes 07/01/19 1727       Discharge Exam: Vitals:   07/05/19 1906 07/05/19 2356 07/06/19 0337 07/06/19 0800  BP: 125/82 (!) 168/63 135/60 (!) 134/93  Pulse: 100 92 76 98  Resp: 20 16 16 18   Temp: 97.8 F (36.6 C) 98.1 F (36.7 C) (!) 97.3 F (36.3 C) 98 F (36.7 C)  TempSrc: Oral Axillary Oral Oral  SpO2: 100% 94% 93% 100%  Weight:      Height:        General: A/O x4, EXTREMELY hard of hearing, no acute respiratory distress Eyes: negative scleral hemorrhage, negative anisocoria, negative icterus ENT: Negative Runny nose, negative gingival bleeding, Neck:  Negative scars, masses, torticollis, lymphadenopathy, JVD Lungs: Clear to auscultation bilaterally without wheezes or crackles Cardiovascular: Regular rate and rhythm without murmur gallop or rub normal S1 and S2   Discharge Instructions   Allergies as of 07/06/2019   No Known Allergies     Medication List    STOP taking these medications   lisinopril 40 MG tablet Commonly known as: ZESTRIL     TAKE these medications   acetaminophen 650 MG CR tablet Commonly known as: TYLENOL Take 650 mg by mouth daily.   amLODipine 5 MG tablet Commonly known as: NORVASC Take 2 tablets (10 mg total) by mouth daily. What changed: how much to take   apixaban 5 MG Tabs tablet Commonly known as: ELIQUIS Take 5 mg by mouth 2 (two) times daily.    ascorbic acid 500 MG tablet Commonly known as: VITAMIN C Take 1 tablet (500 mg total) by mouth daily. Start taking on: July 07, 2019   aspirin 81 MG tablet Take 1 tablet (81 mg total) by mouth daily.   atorvastatin 40 MG tablet Commonly known as: LIPITOR Take 1 tablet (40 mg total) by mouth daily at 6 PM.   dexamethasone 6 MG tablet Commonly known as: DECADRON Take 1 tablet (6 mg total) by mouth daily.   finasteride 5 MG tablet Commonly known as: PROSCAR Take 5 mg by mouth daily.   Fish Oil 1000 MG Caps Take 1 capsule by mouth daily.   guaiFENesin-dextromethorphan 100-10 MG/5ML syrup Commonly known as: ROBITUSSIN DM Take 10 mLs by mouth every 4 (four) hours as needed for cough.   insulin NPH-regular Human (70-30) 100 UNIT/ML injection Inject 30 Units into the skin 2 (two) times daily with a meal.   Ipratropium-Albuterol 20-100 MCG/ACT Aers respimat Commonly known as: COMBIVENT Inhale 1 puff into the lungs  4 (four) times daily.   metFORMIN 1000 MG tablet Commonly known as: GLUCOPHAGE Take 1,000 mg by mouth 2 (two) times daily with a meal.   mirabegron ER 50 MG Tb24 tablet Commonly known as: MYRBETRIQ Take 50 mg by mouth at bedtime.   ondansetron 4 MG tablet Commonly known as: ZOFRAN Take 1 tablet (4 mg total) by mouth every 6 (six) hours as needed for nausea.   PARoxetine 30 MG tablet Commonly known as: PAXIL Take 30 mg by mouth daily.   Simbrinza 1-0.2 % Susp Generic drug: Brinzolamide-Brimonidine Apply 1 drop to eye 3 (three) times daily.   zinc sulfate 220 (50 Zn) MG capsule Take 1 capsule (220 mg total) by mouth daily. Start taking on: July 07, 2019      No Known Allergies    The results of significant diagnostics from this hospitalization (including imaging, microbiology, ancillary and laboratory) are listed below for reference.    Significant Diagnostic Studies: DG Chest 1 View  Result Date: 07/01/2019 CLINICAL DATA:  Dyspnea EXAM:  CHEST  1 VIEW COMPARISON:  06/30/2019 FINDINGS: Single frontal view of the chest demonstrates a stable cardiac silhouette. Atherosclerosis of the aorta unchanged. Chronic scarring seen at the lung bases. No acute airspace disease, effusion, or pneumothorax. 1.6 cm calcified nodule seen overlying the left mid chest unchanged since numerous prior exams. IMPRESSION: 1. Stable exam, no acute process. Electronically Signed   By: Randa Ngo M.D.   On: 07/01/2019 19:29    Microbiology: No results found for this or any previous visit (from the past 240 hour(s)).   Labs: Basic Metabolic Panel: Recent Labs  Lab 07/02/19 0255 07/03/19 0209 07/03/19 0755 07/04/19 0210 07/05/19 0102 07/06/19 0010  NA 135 137  --  136 139 140  K 4.4 4.3  --  4.3 4.6 3.8  CL 101 103  --  103 104 105  CO2 18* 22  --  23 26 24   GLUCOSE 345* 182*  --  243* 297* 40*  BUN 40* 53*  --  57* 54* 49*  CREATININE 1.59* 1.52*  --  1.40* 1.23 1.12  CALCIUM 8.9 8.9  --  8.6* 9.0 9.1  MG  --   --  1.8  --   --  1.7  PHOS  --   --  3.3 2.8 1.6* 1.9*   Liver Function Tests: Recent Labs  Lab 07/02/19 0255 07/03/19 0209 07/04/19 0210 07/05/19 0102 07/06/19 0010  AST 67* 66* 51* 37 34  ALT 34 34 35 34 35  ALKPHOS 62 67 65 66 61  BILITOT 0.8 0.6 0.6 0.4 0.4  PROT 6.7 7.3 6.6 6.6 6.8  ALBUMIN 3.6 3.7 3.3* 3.3* 3.4*   No results for input(s): LIPASE, AMYLASE in the last 168 hours. No results for input(s): AMMONIA in the last 168 hours. CBC: Recent Labs  Lab 07/01/19 1839 07/03/19 0755 07/04/19 0210 07/05/19 0102 07/06/19 0010  WBC 5.0 9.4 8.7 9.6 11.2*  NEUTROABS 3.2 7.9* 7.4 7.6 9.4*  HGB 12.2* 13.8 13.1 13.1 13.8  HCT 35.5* 42.1 39.0 39.3 40.6  MCV 83.9 87.7 86.1 86.0 85.3  PLT 226 228 252 275 258   Cardiac Enzymes: No results for input(s): CKTOTAL, CKMB, CKMBINDEX, TROPONINI in the last 168 hours. BNP: BNP (last 3 results) No results for input(s): BNP in the last 8760 hours.  ProBNP (last 3  results) No results for input(s): PROBNP in the last 8760 hours.  CBG: Recent Labs  Lab 07/05/19 1523 07/05/19 1944  07/05/19 2356 07/06/19 0401 07/06/19 0730  GLUCAP 227* 205* 76 138* 153*       Signed:  Dia Crawford, MD [facility] [phone] pager

## 2019-07-06 NOTE — Progress Notes (Signed)
Notified by Lab that pt BG was 40, assessed pt found to be responsive and able to communicate.  VSS, 25g IV dextrose given.

## 2019-07-06 NOTE — Progress Notes (Signed)
Occupational Therapy Treatment Patient Details Name: David Steele MRN: [id number] DOB: June 10, 1925 Today's Date: 07/06/2019    History of present illness 86 y.o.malewith medical history significant ofhypertension, dyslipidemia, type 2 diabetes mellitus, hypoacusia and prostate cancer. Patient reported for 4 days of not feeling well, generalized weakness, dry cough, and dyspnea. His predominant symptom was weakness, no improving factors, worse with exertion, associated with fevers but no chills. He had difficulty ambulating and poor appetite. Due to persistent symptoms [facility] with SARS COVID-19 viral pneumonia. On February 15 he was transferred to [facility] for further management.   OT comments  Patient up in chair on arrival.  He completed mobility with min guard and RW, but did require min assist for standing grooming ADLs.  He seemed to be more confused today than during prior visits, but unsure if this was due to misunderstanding because [facility].  Patient did not know what to do to wash up at sink and put his hands behind his back and tried to bend over to stick face in water.  Needed multiple written/tactile cues for him to understand what he should do.  Patient would benefit from more supervision at home for safety.   Patient remained on room air throughout session and SpO2>  92.  Completed breathing exercises (pulled 1250).  Will continue to follow with OT acutely to address the deficits listed below and increase independence for safe discharge.    Follow Up Recommendations  Home health OT;Supervision/Assistance - 24 hour    Equipment Recommendations  None recommended by OT    Recommendations for Other Services      Precautions / Restrictions Precautions Precautions: Fall Precaution Comments: extremely HOH Restrictions Weight Bearing Restrictions: No       Mobility Bed  Mobility Overal bed mobility: Needs Assistance             General bed mobility comments: Up in chair  Transfers Overall transfer level: Needs assistance Equipment used: Rolling walker (2 wheeled) Transfers: Sit to/from Stand Sit to Stand: Min guard              Balance Overall balance assessment: Needs assistance Sitting-balance support: Feet supported Sitting balance-Leahy Scale: Good     Standing balance support: During functional activity;Bilateral upper extremity supported Standing balance-Leahy Scale: Fair                             ADL either performed or assessed with clinical judgement   ADL Overall ADL's : Needs assistance/impaired     Grooming: Wash/dry hands;Wash/dry face;Minimal assistance;Standing   Upper Body Bathing: Set up;Sitting   Lower Body Bathing: Min guard;Set up;Sit to/from stand   Upper Body Dressing : Set up;Sitting                   Functional mobility during ADLs: Min guard;Rolling walker General ADL Comments: Patient is shakey when walking until he steadies himself on walker.       Vision       Perception     Praxis      Cognition Arousal/Alertness: Awake/alert Behavior During Therapy: WFL for tasks assessed/performed Overall Cognitive Status: Difficult to assess                                 General Comments: Patient able to read questions and  answer them, but difficult to fully assess due to extreme [facility].        Exercises Exercises: Other exercises Other Exercises Other Exercises: completed flutter valve x 10 Other Exercises: x10 incentive spirometer   Shoulder Instructions       General Comments Patient seemed more confused today than at previous visits    Pertinent Vitals/ Pain       Pain Assessment: No/denies pain  Home Living                                          Prior Functioning/Environment              Frequency  Min 3X/week         Progress Toward Goals  OT Goals(current goals can now be found in the care plan section)  Progress towards OT goals: Progressing toward goals  Acute Rehab OT Goals Patient Stated Goal: to go home OT Goal Formulation: With patient Time For Goal Achievement: 07/18/19 Potential to Achieve Goals: Good  Plan Discharge plan needs to be updated    Co-evaluation                 AM-PAC OT "6 Clicks" Daily Activity     Outcome Measure   Help from another person eating meals?: None Help from another person taking care of personal grooming?: A Little Help from another person toileting, which includes using toliet, bedpan, or urinal?: A Little Help from another person bathing (including washing, rinsing, drying)?: A Little Help from another person to put on and taking off regular upper body clothing?: A Little Help from another person to put on and taking off regular lower body clothing?: A Little 6 Click Score: 19    End of Session Equipment Utilized During Treatment: Gait belt;Rolling walker;Oxygen  OT Visit Diagnosis: Unsteadiness on feet (R26.81);Other abnormalities of gait and mobility (R26.89);Muscle weakness (generalized) (M62.81)   Activity Tolerance Patient tolerated treatment well   Patient Left in chair;with call bell/phone within reach;with chair alarm set   Nurse Communication Mobility status        Time: [id number] OT Time Calculation (min): 27 min  Charges: OT General Charges $OT Visit: 1 Visit OT Treatments $Self Care/Home Management : 23-37 mins  August Luz, OTR/L    Phylliss Bob 07/06/2019, 1:57 PM

## 2019-07-06 NOTE — TOC Transition Note (Signed)
Transition of Care [facility]) - CM/SW Discharge Note   Patient Details  Name: David Steele MRN: [id number] Date of Birth: [birth year]  [facility] CM/SW Contact:  Bartholomew Crews, RN Phone Number: [phone] 07/06/2019, 12:52 PM   Clinical Narrative:   Patient to transition home today. CSW spoke with son yesterday and made referral to [facility] for PT. NCM requested [facility] PT order from MD, and notified Malachy Mood at [place] of discharge today. No O2 needs. No further TOC needs identified at this time.    Final next level of care: [place] Barriers to Discharge: No Barriers Identified   Patient Goals and CMS Choice Patient states their goals for this hospitalization and ongoing recovery are:: To return back home with home heatlh services. CMS Medicare.gov Compare Post Acute Care list provided to:: Patient Represenative (must comment) Choice offered to / list presented to : Adult Children  Discharge Placement                       Discharge Plan and Services     Post Acute Care Choice: Home Health                    HH Arranged: PT HH Agency: [place] Date [facility] Agency Contacted: 07/06/19 Time [place]: [id number] Representative spoke with at [place]: [place] Determinants of Health ([place]) Interventions     Readmission Risk Interventions No flowsheet data found.

## 2019-07-06 NOTE — Progress Notes (Signed)
SATURATION QUALIFICATIONS: (This note is used to comply with regulatory documentation for home oxygen)  Patient Saturations on Room Air at Rest = 99%  Patient Saturations on Room Air while Ambulating = 96%  Patient Saturations on na Liters of oxygen while Ambulating = na%  Please briefly explain why patient needs home oxygen: pt. Was a little sob on the way back from the walk, recovered quickly

## 2019-07-06 NOTE — Progress Notes (Signed)
Pt. Discharged home with son. Discharged instructions given to pt and son.

## 2019-07-09 DIAGNOSIS — I129 Hypertensive chronic kidney disease with stage 1 through stage 4 chronic kidney disease, or unspecified chronic kidney disease: Secondary | ICD-10-CM | POA: Diagnosis not present

## 2019-07-09 DIAGNOSIS — H9190 Unspecified hearing loss, unspecified ear: Secondary | ICD-10-CM | POA: Diagnosis not present

## 2019-07-09 DIAGNOSIS — J1281 Pneumonia due to SARS-associated coronavirus: Secondary | ICD-10-CM | POA: Diagnosis not present

## 2019-07-09 DIAGNOSIS — N183 Chronic kidney disease, stage 3 unspecified: Secondary | ICD-10-CM | POA: Diagnosis not present

## 2019-07-09 DIAGNOSIS — J1282 Pneumonia due to coronavirus disease 2019: Secondary | ICD-10-CM | POA: Diagnosis not present

## 2019-07-09 DIAGNOSIS — U071 COVID-19: Secondary | ICD-10-CM | POA: Diagnosis not present

## 2019-07-09 DIAGNOSIS — Z794 Long term (current) use of insulin: Secondary | ICD-10-CM | POA: Diagnosis not present

## 2019-07-09 DIAGNOSIS — C61 Malignant neoplasm of prostate: Secondary | ICD-10-CM | POA: Diagnosis not present

## 2019-07-16 DIAGNOSIS — N183 Chronic kidney disease, stage 3 unspecified: Secondary | ICD-10-CM | POA: Diagnosis not present

## 2019-07-16 DIAGNOSIS — J1282 Pneumonia due to coronavirus disease 2019: Secondary | ICD-10-CM | POA: Diagnosis not present

## 2019-07-16 DIAGNOSIS — I129 Hypertensive chronic kidney disease with stage 1 through stage 4 chronic kidney disease, or unspecified chronic kidney disease: Secondary | ICD-10-CM | POA: Diagnosis not present

## 2019-07-16 DIAGNOSIS — U071 COVID-19: Secondary | ICD-10-CM | POA: Diagnosis not present

## 2019-07-16 DIAGNOSIS — C61 Malignant neoplasm of prostate: Secondary | ICD-10-CM | POA: Diagnosis not present

## 2019-07-16 DIAGNOSIS — H9190 Unspecified hearing loss, unspecified ear: Secondary | ICD-10-CM | POA: Diagnosis not present

## 2019-07-18 DIAGNOSIS — U071 COVID-19: Secondary | ICD-10-CM | POA: Diagnosis not present

## 2019-07-18 DIAGNOSIS — J1282 Pneumonia due to coronavirus disease 2019: Secondary | ICD-10-CM | POA: Diagnosis not present

## 2019-07-18 DIAGNOSIS — H9190 Unspecified hearing loss, unspecified ear: Secondary | ICD-10-CM | POA: Diagnosis not present

## 2019-07-18 DIAGNOSIS — N183 Chronic kidney disease, stage 3 unspecified: Secondary | ICD-10-CM | POA: Diagnosis not present

## 2019-07-18 DIAGNOSIS — C61 Malignant neoplasm of prostate: Secondary | ICD-10-CM | POA: Diagnosis not present

## 2019-07-18 DIAGNOSIS — I129 Hypertensive chronic kidney disease with stage 1 through stage 4 chronic kidney disease, or unspecified chronic kidney disease: Secondary | ICD-10-CM | POA: Diagnosis not present

## 2019-07-22 DIAGNOSIS — U071 COVID-19: Secondary | ICD-10-CM | POA: Diagnosis not present

## 2019-07-22 DIAGNOSIS — J1282 Pneumonia due to coronavirus disease 2019: Secondary | ICD-10-CM | POA: Diagnosis not present

## 2019-07-22 DIAGNOSIS — N183 Chronic kidney disease, stage 3 unspecified: Secondary | ICD-10-CM | POA: Diagnosis not present

## 2019-07-22 DIAGNOSIS — C61 Malignant neoplasm of prostate: Secondary | ICD-10-CM | POA: Diagnosis not present

## 2019-07-22 DIAGNOSIS — H9190 Unspecified hearing loss, unspecified ear: Secondary | ICD-10-CM | POA: Diagnosis not present

## 2019-07-22 DIAGNOSIS — I129 Hypertensive chronic kidney disease with stage 1 through stage 4 chronic kidney disease, or unspecified chronic kidney disease: Secondary | ICD-10-CM | POA: Diagnosis not present

## 2019-07-25 DIAGNOSIS — I129 Hypertensive chronic kidney disease with stage 1 through stage 4 chronic kidney disease, or unspecified chronic kidney disease: Secondary | ICD-10-CM | POA: Diagnosis not present

## 2019-07-25 DIAGNOSIS — C61 Malignant neoplasm of prostate: Secondary | ICD-10-CM | POA: Diagnosis not present

## 2019-07-25 DIAGNOSIS — H9190 Unspecified hearing loss, unspecified ear: Secondary | ICD-10-CM | POA: Diagnosis not present

## 2019-07-25 DIAGNOSIS — U071 COVID-19: Secondary | ICD-10-CM | POA: Diagnosis not present

## 2019-07-25 DIAGNOSIS — N183 Chronic kidney disease, stage 3 unspecified: Secondary | ICD-10-CM | POA: Diagnosis not present

## 2019-07-25 DIAGNOSIS — J1282 Pneumonia due to coronavirus disease 2019: Secondary | ICD-10-CM | POA: Diagnosis not present

## 2019-07-31 DIAGNOSIS — I129 Hypertensive chronic kidney disease with stage 1 through stage 4 chronic kidney disease, or unspecified chronic kidney disease: Secondary | ICD-10-CM | POA: Diagnosis not present

## 2019-07-31 DIAGNOSIS — C61 Malignant neoplasm of prostate: Secondary | ICD-10-CM | POA: Diagnosis not present

## 2019-07-31 DIAGNOSIS — H9190 Unspecified hearing loss, unspecified ear: Secondary | ICD-10-CM | POA: Diagnosis not present

## 2019-07-31 DIAGNOSIS — J1282 Pneumonia due to coronavirus disease 2019: Secondary | ICD-10-CM | POA: Diagnosis not present

## 2019-07-31 DIAGNOSIS — N183 Chronic kidney disease, stage 3 unspecified: Secondary | ICD-10-CM | POA: Diagnosis not present

## 2019-07-31 DIAGNOSIS — U071 COVID-19: Secondary | ICD-10-CM | POA: Diagnosis not present

## 2019-08-02 DIAGNOSIS — H905 Unspecified sensorineural hearing loss: Secondary | ICD-10-CM | POA: Diagnosis not present

## 2019-08-02 DIAGNOSIS — Z45321 Encounter for adjustment and management of cochlear device: Secondary | ICD-10-CM | POA: Diagnosis not present

## 2019-08-06 DIAGNOSIS — C61 Malignant neoplasm of prostate: Secondary | ICD-10-CM | POA: Diagnosis not present

## 2019-08-06 DIAGNOSIS — D509 Iron deficiency anemia, unspecified: Secondary | ICD-10-CM | POA: Diagnosis not present

## 2019-08-06 DIAGNOSIS — N183 Chronic kidney disease, stage 3 unspecified: Secondary | ICD-10-CM | POA: Diagnosis not present

## 2019-08-06 DIAGNOSIS — E785 Hyperlipidemia, unspecified: Secondary | ICD-10-CM | POA: Diagnosis not present

## 2019-08-06 DIAGNOSIS — Z79899 Other long term (current) drug therapy: Secondary | ICD-10-CM | POA: Diagnosis not present

## 2019-08-06 DIAGNOSIS — H9190 Unspecified hearing loss, unspecified ear: Secondary | ICD-10-CM | POA: Diagnosis not present

## 2019-08-06 DIAGNOSIS — J1282 Pneumonia due to coronavirus disease 2019: Secondary | ICD-10-CM | POA: Diagnosis not present

## 2019-08-06 DIAGNOSIS — I10 Essential (primary) hypertension: Secondary | ICD-10-CM | POA: Diagnosis not present

## 2019-08-06 DIAGNOSIS — E1165 Type 2 diabetes mellitus with hyperglycemia: Secondary | ICD-10-CM | POA: Diagnosis not present

## 2019-08-06 DIAGNOSIS — I129 Hypertensive chronic kidney disease with stage 1 through stage 4 chronic kidney disease, or unspecified chronic kidney disease: Secondary | ICD-10-CM | POA: Diagnosis not present

## 2019-08-06 DIAGNOSIS — U071 COVID-19: Secondary | ICD-10-CM | POA: Diagnosis not present

## 2019-08-08 DIAGNOSIS — I129 Hypertensive chronic kidney disease with stage 1 through stage 4 chronic kidney disease, or unspecified chronic kidney disease: Secondary | ICD-10-CM | POA: Diagnosis not present

## 2019-08-08 DIAGNOSIS — N183 Chronic kidney disease, stage 3 unspecified: Secondary | ICD-10-CM | POA: Diagnosis not present

## 2019-08-08 DIAGNOSIS — Z794 Long term (current) use of insulin: Secondary | ICD-10-CM | POA: Diagnosis not present

## 2019-08-08 DIAGNOSIS — H9190 Unspecified hearing loss, unspecified ear: Secondary | ICD-10-CM | POA: Diagnosis not present

## 2019-08-08 DIAGNOSIS — U071 COVID-19: Secondary | ICD-10-CM | POA: Diagnosis not present

## 2019-08-08 DIAGNOSIS — J1282 Pneumonia due to coronavirus disease 2019: Secondary | ICD-10-CM | POA: Diagnosis not present

## 2019-08-08 DIAGNOSIS — C61 Malignant neoplasm of prostate: Secondary | ICD-10-CM | POA: Diagnosis not present

## 2019-08-09 ENCOUNTER — Other Ambulatory Visit: Payer: Self-pay

## 2019-08-14 DIAGNOSIS — U071 COVID-19: Secondary | ICD-10-CM | POA: Diagnosis not present

## 2019-08-14 DIAGNOSIS — C61 Malignant neoplasm of prostate: Secondary | ICD-10-CM | POA: Diagnosis not present

## 2019-08-14 DIAGNOSIS — N183 Chronic kidney disease, stage 3 unspecified: Secondary | ICD-10-CM | POA: Diagnosis not present

## 2019-08-14 DIAGNOSIS — I129 Hypertensive chronic kidney disease with stage 1 through stage 4 chronic kidney disease, or unspecified chronic kidney disease: Secondary | ICD-10-CM | POA: Diagnosis not present

## 2019-08-14 DIAGNOSIS — H9190 Unspecified hearing loss, unspecified ear: Secondary | ICD-10-CM | POA: Diagnosis not present

## 2019-08-14 DIAGNOSIS — J1282 Pneumonia due to coronavirus disease 2019: Secondary | ICD-10-CM | POA: Diagnosis not present

## 2019-08-16 DIAGNOSIS — H903 Sensorineural hearing loss, bilateral: Secondary | ICD-10-CM | POA: Diagnosis not present

## 2019-08-20 DIAGNOSIS — N183 Chronic kidney disease, stage 3 unspecified: Secondary | ICD-10-CM | POA: Diagnosis not present

## 2019-08-20 DIAGNOSIS — C61 Malignant neoplasm of prostate: Secondary | ICD-10-CM | POA: Diagnosis not present

## 2019-08-20 DIAGNOSIS — J1282 Pneumonia due to coronavirus disease 2019: Secondary | ICD-10-CM | POA: Diagnosis not present

## 2019-08-20 DIAGNOSIS — I129 Hypertensive chronic kidney disease with stage 1 through stage 4 chronic kidney disease, or unspecified chronic kidney disease: Secondary | ICD-10-CM | POA: Diagnosis not present

## 2019-08-20 DIAGNOSIS — U071 COVID-19: Secondary | ICD-10-CM | POA: Diagnosis not present

## 2019-08-20 DIAGNOSIS — H9190 Unspecified hearing loss, unspecified ear: Secondary | ICD-10-CM | POA: Diagnosis not present

## 2019-09-24 DIAGNOSIS — H401131 Primary open-angle glaucoma, bilateral, mild stage: Secondary | ICD-10-CM | POA: Diagnosis not present

## 2019-09-27 DIAGNOSIS — E119 Type 2 diabetes mellitus without complications: Secondary | ICD-10-CM | POA: Diagnosis not present

## 2019-09-27 DIAGNOSIS — H401112 Primary open-angle glaucoma, right eye, moderate stage: Secondary | ICD-10-CM | POA: Diagnosis not present

## 2019-09-27 DIAGNOSIS — Z01818 Encounter for other preprocedural examination: Secondary | ICD-10-CM | POA: Diagnosis not present

## 2019-10-08 DIAGNOSIS — D509 Iron deficiency anemia, unspecified: Secondary | ICD-10-CM | POA: Diagnosis not present

## 2019-10-24 DIAGNOSIS — H401112 Primary open-angle glaucoma, right eye, moderate stage: Secondary | ICD-10-CM | POA: Diagnosis not present

## 2020-01-08 DIAGNOSIS — I10 Essential (primary) hypertension: Secondary | ICD-10-CM | POA: Diagnosis not present

## 2020-01-08 DIAGNOSIS — E1165 Type 2 diabetes mellitus with hyperglycemia: Secondary | ICD-10-CM | POA: Diagnosis not present

## 2020-01-08 DIAGNOSIS — M25511 Pain in right shoulder: Secondary | ICD-10-CM | POA: Diagnosis not present

## 2020-01-08 DIAGNOSIS — Z6829 Body mass index (BMI) 29.0-29.9, adult: Secondary | ICD-10-CM | POA: Diagnosis not present

## 2020-01-08 DIAGNOSIS — I480 Paroxysmal atrial fibrillation: Secondary | ICD-10-CM | POA: Diagnosis not present

## 2020-01-08 DIAGNOSIS — Z79899 Other long term (current) drug therapy: Secondary | ICD-10-CM | POA: Diagnosis not present

## 2020-01-08 DIAGNOSIS — E1140 Type 2 diabetes mellitus with diabetic neuropathy, unspecified: Secondary | ICD-10-CM | POA: Diagnosis not present

## 2020-03-03 DIAGNOSIS — D649 Anemia, unspecified: Secondary | ICD-10-CM | POA: Diagnosis not present

## 2020-03-03 DIAGNOSIS — Z23 Encounter for immunization: Secondary | ICD-10-CM | POA: Diagnosis not present

## 2020-03-03 DIAGNOSIS — R55 Syncope and collapse: Secondary | ICD-10-CM | POA: Diagnosis not present

## 2020-03-05 DIAGNOSIS — H401112 Primary open-angle glaucoma, right eye, moderate stage: Secondary | ICD-10-CM | POA: Diagnosis not present

## 2020-03-11 ENCOUNTER — Encounter: Payer: Self-pay | Admitting: *Deleted

## 2020-03-11 ENCOUNTER — Encounter: Payer: Self-pay | Admitting: Cardiology

## 2020-03-11 DIAGNOSIS — I700 Atherosclerosis of aorta: Secondary | ICD-10-CM | POA: Insufficient documentation

## 2020-03-17 ENCOUNTER — Encounter: Payer: Self-pay | Admitting: Cardiology

## 2020-03-17 ENCOUNTER — Ambulatory Visit (INDEPENDENT_AMBULATORY_CARE_PROVIDER_SITE_OTHER): Payer: Medicare Other | Admitting: Cardiology

## 2020-03-17 ENCOUNTER — Ambulatory Visit (INDEPENDENT_AMBULATORY_CARE_PROVIDER_SITE_OTHER): Payer: Medicare Other

## 2020-03-17 ENCOUNTER — Ambulatory Visit: Payer: Medicare Other | Admitting: Cardiology

## 2020-03-17 ENCOUNTER — Other Ambulatory Visit: Payer: Self-pay

## 2020-03-17 VITALS — Ht 71.5 in | Wt 218.0 lb

## 2020-03-17 DIAGNOSIS — I480 Paroxysmal atrial fibrillation: Secondary | ICD-10-CM | POA: Diagnosis not present

## 2020-03-17 DIAGNOSIS — Z7901 Long term (current) use of anticoagulants: Secondary | ICD-10-CM | POA: Diagnosis not present

## 2020-03-17 DIAGNOSIS — I951 Orthostatic hypotension: Secondary | ICD-10-CM

## 2020-03-17 DIAGNOSIS — I440 Atrioventricular block, first degree: Secondary | ICD-10-CM | POA: Diagnosis not present

## 2020-03-17 DIAGNOSIS — E1165 Type 2 diabetes mellitus with hyperglycemia: Secondary | ICD-10-CM | POA: Diagnosis not present

## 2020-03-17 DIAGNOSIS — IMO0002 Reserved for concepts with insufficient information to code with codable children: Secondary | ICD-10-CM

## 2020-03-17 DIAGNOSIS — E118 Type 2 diabetes mellitus with unspecified complications: Secondary | ICD-10-CM

## 2020-03-17 DIAGNOSIS — R001 Bradycardia, unspecified: Secondary | ICD-10-CM

## 2020-03-17 DIAGNOSIS — E785 Hyperlipidemia, unspecified: Secondary | ICD-10-CM

## 2020-03-17 DIAGNOSIS — R55 Syncope and collapse: Secondary | ICD-10-CM

## 2020-03-17 NOTE — Patient Instructions (Signed)
Medication Instructions:  Your physician recommends that you continue on your current medications as directed. Please refer to the Current Medication list given to you today.  *If you need a refill on your cardiac medications before your next appointment, please call your pharmacy*   Lab Work: none If you have labs (blood work) drawn today and your tests are completely normal, you will receive your results only by: Marland Kitchen MyChart Message (if you have MyChart) OR . A paper copy in the mail If you have any lab test that is abnormal or we need to change your treatment, we will call you to review the results.   Testing/Procedures: Your physician has requested that you have an echocardiogram. Echocardiography is a painless test that uses sound waves to create images of your heart. It provides your doctor with information about the size and shape of your heart and how well your heart's chambers and valves are working. This procedure takes approximately one hour. There are no restrictions for this procedure.  A zio monitor was ordered today. It will remain on for 7 days. You will then return monitor and event diary in provided box. It takes 1-2 weeks for report to be downloaded and returned to [place]. We will call you with the results. If monitor falls off or has orange flashing light, please call Zio for further instructions.      Follow-Up: At [facility], you and your health needs are our priority.  As part of our continuing mission to provide you with exceptional heart care, we have created designated Provider Care Teams.  These Care Teams include your primary Cardiologist (physician) and Advanced Practice Providers (APPs -  Physician Assistants and Nurse Practitioners) who all work together to provide you with the care you need, when you need it.  We recommend signing up for the patient portal called "MyChart".  Sign up information is provided on this After Visit Summary.  MyChart is used to connect  with patients for Virtual Visits (Telemedicine).  Patients are able to view lab/test results, encounter notes, upcoming appointments, etc.  Non-urgent messages can be sent to your provider as well.   To learn more about what you can do with MyChart, go to [email or web address].    Your next appointment:   2 month(s)  The format for your next appointment:   In Person  Provider:   Jenne Campus, MD   Other Instructions   Echocardiogram An echocardiogram is a procedure that uses painless sound waves (ultrasound) to produce an image of the heart. Images from an echocardiogram can provide important information about:  Signs of coronary artery disease (CAD).  Aneurysm detection. An aneurysm is a weak or damaged part of an artery wall that bulges out from the normal force of blood pumping through the body.  Heart size and shape. Changes in the size or shape of the heart can be associated with certain conditions, including heart failure, aneurysm, and CAD.  Heart muscle function.  Heart valve function.  Signs of a past heart attack.  Fluid buildup around the heart.  Thickening of the heart muscle.  A tumor or infectious growth around the heart valves. Tell a health care provider about:  Any allergies you have.  All medicines you are taking, including vitamins, herbs, eye drops, creams, and over-the-counter medicines.  Any blood disorders you have.  Any surgeries you have had.  Any medical conditions you have.  Whether you are pregnant or may be pregnant. What are the risks? Generally,  this is a safe procedure. However, problems may occur, including:  Allergic reaction to dye (contrast) that may be used during the procedure. What happens before the procedure? No specific preparation is needed. You may eat and drink normally. What happens during the procedure?   An IV tube may be inserted into one of your veins.  You may receive contrast through this tube. A  contrast is an injection that improves the quality of the pictures from your heart.  A gel will be applied to your chest.  A wand-like tool (transducer) will be moved over your chest. The gel will help to transmit the sound waves from the transducer.  The sound waves will harmlessly bounce off of your heart to allow the heart images to be captured in real-time motion. The images will be recorded on a computer. The procedure may vary among health care providers and hospitals. What happens after the procedure?  You may return to your normal, everyday life, including diet, activities, and medicines, unless your health care provider tells you not to do that. Summary  An echocardiogram is a procedure that uses painless sound waves (ultrasound) to produce an image of the heart.  Images from an echocardiogram can provide important information about the size and shape of your heart, heart muscle function, heart valve function, and fluid buildup around your heart.  You do not need to do anything to prepare before this procedure. You may eat and drink normally.  After the echocardiogram is completed, you may return to your normal, everyday life, unless your health care provider tells you not to do that. This information is not intended to replace advice given to you by your health care provider. Make sure you discuss any questions you have with your health care provider. Document Revised: 08/23/2018 Document Reviewed: 06/04/2016 Elsevier Patient Education  [place].

## 2020-03-17 NOTE — Progress Notes (Signed)
Cardiology Consultation:    Date:  03/17/2020   ID:  [name], DOB [birth year], MRN [id number]  PCP:  Ernestene Kiel, MD  Cardiologist:  Jenne Campus, MD   Referring MD: Ernestene Kiel, MD   Chief Complaint  Patient presents with  . Near Syncope  . Dizziness    History of Present Illness:    David Steele is a 86 y.o. male who is being seen today for the evaluation of dizziness at the request of Ernestene Kiel, MD.  Past medical history significant for essential hypertension, diabetes, paroxysmal atrial fibrillation, anticoagulated, bladder cancer, dyslipidemia.  He was referred to [place] because of episode of dizziness.  He said for about a year he will have episode that his vision became dark tunnel, vision it out usually happen when he gets up.  It never happens when he sits and when he lays down.  It happens about 2-3 times a week.  He never passed out and fell down because of this however many times he said if he would not hold to something he being on the ground.  Denies have any chest pain tightness squeezing pressure burning chest no palpitations dizziness swelling of lower extremities otherwise in spite of his advanced age is 86 years old he still very active.  He still like to work in the garden ride his tractor.  Past Medical History:  Diagnosis Date  . Acute respiratory failure with hypoxia ([place]) 07/03/2019  . Anemia   . Anxiety   . Aortic atherosclerosis ([place])   . Bladder cancer ([place])   . BPH (benign prostatic hyperplasia)   . Cerebral ischemia   . CKD (chronic kidney disease), stage III ([place]) 07/03/2019  . Current use of long term anticoagulation 04/19/2019  . Depressive disorder   . Diabetes mellitus   . Diabetes mellitus type 2, uncontrolled, with complications ([place]) 01/16/4096  . DOE (dyspnea on exertion) 02/23/2017  . First degree AV block 12/14/2017  . History of bladder cancer 02/23/2017  . HLD (hyperlipidemia) 07/03/2019  . Hyperlipidemia     . Hypertension   . Near syncope 02/23/2017  . Orthostatic hypotension 08/31/2010  . Paroxysmal atrial fibrillation ([place]) 02/23/2017  . Plantar fasciitis   . Pneumonia due to COVID-19 virus 07/01/2019  . Profound sensorineural hearing loss (SNHL) 11/13/2017   Formatting of this note might be different from the original. Added automatically from request for surgery [id number]  . Prostate cancer ([place]) 07/03/2019  . Sinus bradycardia 02/23/2017    Past Surgical History:  Procedure Laterality Date  . BLADDER SURGERY  2007   Cancerous tumor removed  . EYE SURGERY    . TONSILLECTOMY      Current Medications: Current Meds  Medication Sig  . acetaminophen (TYLENOL) 650 MG CR tablet Take 650 mg by mouth daily.  Marland Kitchen amLODipine (NORVASC) 5 MG tablet Take 1 tablet by mouth daily.  Marland Kitchen apixaban (ELIQUIS) 5 MG TABS tablet Take 5 mg by mouth 2 (two) times daily.  . ferrous sulfate 325 (65 FE) MG tablet Take 325 mg by mouth daily with breakfast.  . finasteride (PROSCAR) 5 MG tablet Take 5 mg by mouth daily.    Marland Kitchen guaiFENesin (MUCINEX) 600 MG 12 hr tablet Take 600 mg by mouth 2 (two) times daily as needed.  . insulin NPH-regular Human (70-30) 100 UNIT/ML injection Inject 30 Units into the skin 2 (two) times daily with a meal. 30 units before meal in AM and 25 units in evening before meal Subcutaneous Patient  uses vials  . lisinopril (ZESTRIL) 40 MG tablet 40 mg daily.  . metFORMIN (GLUCOPHAGE) 1000 MG tablet Take 1,000 mg by mouth 2 (two) times daily with a meal.   . mirabegron ER (MYRBETRIQ) 50 MG TB24 tablet Take 50 mg by mouth at bedtime.  . Multiple Vitamins-Minerals (CENTRUM SILVER ADULT 50+ PO) Take 1 tablet by mouth daily.  Marland Kitchen PARoxetine (PAXIL) 30 MG tablet Take 30 mg by mouth daily.     Allergies:   Simvastatin   Social History   Socioeconomic History  . Marital status: Single    Spouse name: Not on file  . Number of children: Not on file  . Years of education: Not on file  . Highest  education level: Not on file  Occupational History  . Occupation: reired  Tobacco Use  . Smoking status: Former Smoker    Quit date: 05/16/1988    Years since quitting: 31.8  . Smokeless tobacco: Never Used  Vaping Use  . Vaping Use: Former  Substance and Sexual Activity  . Alcohol use: No  . Drug use: No  . Sexual activity: Not Currently  Other Topics Concern  . Not on file  Social History Narrative  . Not on file   Social Determinants of Health   Financial Resource Strain:   . Difficulty of Paying Living Expenses: Not on file  Food Insecurity:   . Worried About Charity fundraiser in the Last Year: Not on file  . Ran Out of Food in the Last Year: Not on file  Transportation Needs:   . Lack of Transportation (Medical): Not on file  . Lack of Transportation (Non-Medical): Not on file  Physical Activity:   . Days of Exercise per Week: Not on file  . Minutes of Exercise per Session: Not on file  Stress:   . Feeling of Stress : Not on file  Social Connections:   . Frequency of Communication with Friends and Family: Not on file  . Frequency of Social Gatherings with Friends and Family: Not on file  . Attends Religious Services: Not on file  . Active Member of Clubs or Organizations: Not on file  . Attends Archivist Meetings: Not on file  . Marital Status: Not on file     Family History: The patient's family history is not on file. ROS:   Please see the history of present illness.    All 14 point review of systems negative except as described per history of present illness.  EKGs/Labs/Other Studies Reviewed:    The following studies were reviewed today:   EKG:  EKG is  ordered today.  The ekg ordered today demonstrates normal sinus rhythm rate 62 first-degree AV block with PR interval of 304, otherwise normal EKG  Recent Labs: 07/06/2019: ALT 35; BUN 49; Creatinine, Ser 1.12; Hemoglobin 13.8; Magnesium 1.7; Platelets 258; Potassium 3.8; Sodium 140  Recent  Lipid Panel    Component Value Date/Time   CHOL 138 07/04/2019 0210   TRIG 35 07/04/2019 0210   HDL 36 (L) 07/04/2019 0210   CHOLHDL 3.8 07/04/2019 0210   VLDL 7 07/04/2019 0210   LDLCALC 95 07/04/2019 0210    Physical Exam:    VS:  BP 140/70 (BP Location: Left Arm, Patient Position: Sitting, Cuff Size: Normal)   Pulse 62   Ht 5' 11.5" (1.816 m)   Wt 218 lb (98.9 kg)   SpO2 94%   BMI 29.98 kg/m     Wt Readings from  Last 3 Encounters:  03/17/20 218 lb (98.9 kg)  03/03/20 217 lb (98.4 kg)  07/01/19 216 lb (98 kg)     GEN:  Well nourished, well developed in no acute distress HEENT: Normal NECK: No JVD; No carotid bruits LYMPHATICS: No lymphadenopathy CARDIAC: RRR, no murmurs, no rubs, no gallops RESPIRATORY:  Clear to auscultation without rales, wheezing or rhonchi  ABDOMEN: Soft, non-tender, non-distended MUSCULOSKELETAL:  No edema; No deformity  SKIN: Warm and dry NEUROLOGIC:  Alert and oriented x 3 PSYCHIATRIC:  Normal affect   ASSESSMENT:    1. Near syncope   2. First degree AV block   3. Sinus bradycardia   4. Paroxysmal atrial fibrillation (HCC)   5. Orthostatic hypotension   6. Dyslipidemia   7. Current use of long term anticoagulation   8. Diabetes mellitus type 2, uncontrolled, with complications ([place])    PLAN:    In order of problems listed above:  1. Dizziness I suspect orthostatic.  Will check his orthostasis today.  I will also ask you to wear Zio patch for about a week to see if he had any significant arrhythmia.  As a part of evaluation echocardiogram will be done to check his left ventricle ejection fraction overall valve all function. 2. Essential hypertension his blood pressure is slightly high today but this is first visit and again we will check his orthostasis today. 3. Paroxysmal atrial fibrillation he is anticoagulated because of high CHA2DS2-VASc score which equals 4, however I need to be careful because of unsteadiness and worrisome for  falls likely he never fell down because of his episode of dizziness. 4. Dyslipidemia: I do not see him taking any statin but he should be on statin because of diabetes.  We will continue this discussion when he will be here next time. 5. Diabetes mellitus followed by internal medicine team I did review his K PN which showed me his hemoglobin A1c of 7.8 from 01/08/2020.   Medication Adjustments/Labs and Tests Ordered: Current medicines are reviewed at length with the patient today.  Concerns regarding medicines are outlined above.  No orders of the defined types were placed in this encounter.  No orders of the defined types were placed in this encounter.   Signed, Park Liter, MD, [facility]. 03/17/2020 4:17 PM    [place]

## 2020-04-06 ENCOUNTER — Other Ambulatory Visit: Payer: Self-pay

## 2020-04-06 ENCOUNTER — Ambulatory Visit (INDEPENDENT_AMBULATORY_CARE_PROVIDER_SITE_OTHER): Payer: Medicare Other

## 2020-04-06 DIAGNOSIS — E785 Hyperlipidemia, unspecified: Secondary | ICD-10-CM | POA: Diagnosis not present

## 2020-04-06 DIAGNOSIS — I480 Paroxysmal atrial fibrillation: Secondary | ICD-10-CM | POA: Diagnosis not present

## 2020-04-06 DIAGNOSIS — IMO0002 Reserved for concepts with insufficient information to code with codable children: Secondary | ICD-10-CM

## 2020-04-06 DIAGNOSIS — I440 Atrioventricular block, first degree: Secondary | ICD-10-CM | POA: Diagnosis not present

## 2020-04-06 DIAGNOSIS — R55 Syncope and collapse: Secondary | ICD-10-CM | POA: Diagnosis not present

## 2020-04-06 DIAGNOSIS — E118 Type 2 diabetes mellitus with unspecified complications: Secondary | ICD-10-CM | POA: Diagnosis not present

## 2020-04-06 DIAGNOSIS — R001 Bradycardia, unspecified: Secondary | ICD-10-CM | POA: Diagnosis not present

## 2020-04-06 DIAGNOSIS — I951 Orthostatic hypotension: Secondary | ICD-10-CM

## 2020-04-06 DIAGNOSIS — Z7901 Long term (current) use of anticoagulants: Secondary | ICD-10-CM

## 2020-04-06 DIAGNOSIS — E1165 Type 2 diabetes mellitus with hyperglycemia: Secondary | ICD-10-CM

## 2020-04-06 LAB — ECHOCARDIOGRAM COMPLETE
Area-P 1/2: 4.31 cm2
S' Lateral: 3.4 cm

## 2020-04-06 NOTE — Progress Notes (Signed)
Complete echocardiogram has been performed.  Jimmy Delbert Darley RDCS, RVT 

## 2020-05-04 DIAGNOSIS — N320 Bladder-neck obstruction: Secondary | ICD-10-CM | POA: Diagnosis not present

## 2020-05-04 DIAGNOSIS — C61 Malignant neoplasm of prostate: Secondary | ICD-10-CM | POA: Diagnosis not present

## 2020-05-04 DIAGNOSIS — R351 Nocturia: Secondary | ICD-10-CM | POA: Diagnosis not present

## 2020-05-04 DIAGNOSIS — R3915 Urgency of urination: Secondary | ICD-10-CM | POA: Diagnosis not present

## 2020-05-04 DIAGNOSIS — C679 Malignant neoplasm of bladder, unspecified: Secondary | ICD-10-CM | POA: Diagnosis not present

## 2020-05-06 DIAGNOSIS — H401131 Primary open-angle glaucoma, bilateral, mild stage: Secondary | ICD-10-CM | POA: Diagnosis not present

## 2020-05-14 ENCOUNTER — Other Ambulatory Visit: Payer: Self-pay

## 2020-05-14 DIAGNOSIS — M722 Plantar fascial fibromatosis: Secondary | ICD-10-CM | POA: Insufficient documentation

## 2020-05-14 DIAGNOSIS — C679 Malignant neoplasm of bladder, unspecified: Secondary | ICD-10-CM | POA: Insufficient documentation

## 2020-05-14 DIAGNOSIS — D649 Anemia, unspecified: Secondary | ICD-10-CM | POA: Insufficient documentation

## 2020-05-14 DIAGNOSIS — F32A Depression, unspecified: Secondary | ICD-10-CM | POA: Insufficient documentation

## 2020-05-14 DIAGNOSIS — I6782 Cerebral ischemia: Secondary | ICD-10-CM | POA: Insufficient documentation

## 2020-05-14 DIAGNOSIS — F419 Anxiety disorder, unspecified: Secondary | ICD-10-CM | POA: Insufficient documentation

## 2020-05-14 DIAGNOSIS — N400 Enlarged prostate without lower urinary tract symptoms: Secondary | ICD-10-CM | POA: Insufficient documentation

## 2020-05-14 DIAGNOSIS — E785 Hyperlipidemia, unspecified: Secondary | ICD-10-CM | POA: Insufficient documentation

## 2020-05-19 ENCOUNTER — Encounter: Payer: Self-pay | Admitting: Cardiology

## 2020-05-19 ENCOUNTER — Other Ambulatory Visit: Payer: Self-pay

## 2020-05-19 ENCOUNTER — Ambulatory Visit (INDEPENDENT_AMBULATORY_CARE_PROVIDER_SITE_OTHER): Payer: Medicare Other | Admitting: Cardiology

## 2020-05-19 VITALS — BP 160/60 | HR 63 | Ht 71.0 in | Wt 216.0 lb

## 2020-05-19 DIAGNOSIS — E118 Type 2 diabetes mellitus with unspecified complications: Secondary | ICD-10-CM | POA: Diagnosis not present

## 2020-05-19 DIAGNOSIS — I4729 Other ventricular tachycardia: Secondary | ICD-10-CM

## 2020-05-19 DIAGNOSIS — I472 Ventricular tachycardia: Secondary | ICD-10-CM | POA: Insufficient documentation

## 2020-05-19 DIAGNOSIS — R55 Syncope and collapse: Secondary | ICD-10-CM | POA: Diagnosis not present

## 2020-05-19 DIAGNOSIS — Z87891 Personal history of nicotine dependence: Secondary | ICD-10-CM

## 2020-05-19 DIAGNOSIS — H905 Unspecified sensorineural hearing loss: Secondary | ICD-10-CM | POA: Diagnosis not present

## 2020-05-19 DIAGNOSIS — E1165 Type 2 diabetes mellitus with hyperglycemia: Secondary | ICD-10-CM

## 2020-05-19 DIAGNOSIS — IMO0002 Reserved for concepts with insufficient information to code with codable children: Secondary | ICD-10-CM

## 2020-05-19 DIAGNOSIS — I480 Paroxysmal atrial fibrillation: Secondary | ICD-10-CM | POA: Diagnosis not present

## 2020-05-19 HISTORY — DX: Ventricular tachycardia: I47.2

## 2020-05-19 HISTORY — DX: Other ventricular tachycardia: I47.29

## 2020-05-19 NOTE — Progress Notes (Unsigned)
Cardiology Office Note:    Date:  05/19/2020   ID:  David Steele, DOB 03/30/1926, MRN [id number]  PCP:  Philemon Kingdom, MD  Cardiologist:  Gypsy Balsam, MD    Referring MD: Philemon Kingdom, MD   No chief complaint on file. Still having dizzy spells  History of Present Illness:    David Steele is a 87 y.o. male who was referred to [place] because of dizziness.  He does have baseline hypotension dizziness usually happen when he gets up very quickly does not happen when he sits when he lays down.  We did echocardiogram which showed preserved left ventricle ejection fraction, he also wore monitor which surprisingly showed some short short runs of nonsustained ventricular tachycardia.  Those were completely asymptomatic.  He comes to talk about that.  Past Medical History:  Diagnosis Date  . Acute respiratory failure with hypoxia (HCC) 07/03/2019  . Advanced age 84[birth year]10[birth year]2018  . Anemia   . Anxiety   . Aortic atherosclerosis (HCC)   . Benign essential HTN 02/23/2017  . Bladder cancer (HCC)   . BPH (benign prostatic hyperplasia)   . Cerebral ischemia   . CKD (chronic kidney disease), stage III (HCC) 07/03/2019  . Current use of long term anticoagulation 04/19/2019  . Depressive disorder   . Diabetes mellitus   . Diabetes mellitus type 2, uncontrolled, with complications (HCC) 07/03/2019  . DOE (dyspnea on exertion) 02/23/2017  . First degree AV block 12/14/2017  . Former smoker 06/15/2017  . History of bladder cancer 02/23/2017  . HLD (hyperlipidemia) 07/03/2019  . Hyperlipidemia   . Hypertension   . Light headed 02/23/2017  . Near syncope 02/23/2017  . Orthostatic hypotension 08/31/2010  . Other fatigue 02/23/2017  . Paroxysmal atrial fibrillation (HCC) 02/23/2017  . Plantar fasciitis   . Pneumonia due to COVID-19 virus 07/01/2019  . Profound sensorineural hearing loss (SNHL) 11/13/2017   Formatting of this note might be different from the original. Added automatically from  request for surgery [id number]  . Prostate cancer (HCC) 07/03/2019  . Sinus bradycardia 02/23/2017  . T2DM (type 2 diabetes mellitus) (HCC) 07/01/2019    Past Surgical History:  Procedure Laterality Date  . BLADDER SURGERY  2007   Cancerous tumor removed  . EYE SURGERY    . TONSILLECTOMY      Current Medications: Current Meds  Medication Sig  . acetaminophen (TYLENOL) 650 MG CR tablet Take 650 mg by mouth daily.  Marland Kitchen amLODipine (NORVASC) 5 MG tablet Take 1 tablet by mouth daily.  Marland Kitchen apixaban (ELIQUIS) 5 MG TABS tablet Take 5 mg by mouth 2 (two) times daily.  . Brinzolamide-Brimonidine (SIMBRINZA) 1-0.2 % SUSP Apply 1 drop to eye daily.  . ferrous sulfate 325 (65 FE) MG tablet Take 325 mg by mouth daily with breakfast.  . insulin NPH-regular Human (70-30) 100 UNIT/ML injection Inject 30 Units into the skin 2 (two) times daily with a meal. 30 units before meal in AM and 25 units in evening before meal Subcutaneous Patient uses vials  . lisinopril (ZESTRIL) 40 MG tablet 40 mg daily.  . metFORMIN (GLUCOPHAGE) 1000 MG tablet Take 1,000 mg by mouth 2 (two) times daily with a meal.   . mirabegron ER (MYRBETRIQ) 50 MG TB24 tablet Take 50 mg by mouth at bedtime.  Marland Kitchen PARoxetine (PAXIL) 30 MG tablet Take 30 mg by mouth daily.     Allergies:   Simvastatin   Social History   Socioeconomic History  . Marital status: Single  Spouse name: Not on file  . Number of children: Not on file  . Years of education: Not on file  . Highest education level: Not on file  Occupational History  . Occupation: reired  Tobacco Use  . Smoking status: Former Smoker    Quit date: 05/16/1988    Years since quitting: 32.0  . Smokeless tobacco: Never Used  Vaping Use  . Vaping Use: Former  Substance and Sexual Activity  . Alcohol use: No  . Drug use: No  . Sexual activity: Not Currently  Other Topics Concern  . Not on file  Social History Narrative  . Not on file   Social Determinants of Health    Financial Resource Strain: Not on file  Food Insecurity: Not on file  Transportation Needs: Not on file  Physical Activity: Not on file  Stress: Not on file  Social Connections: Not on file     Family History: The patient's family history is not on file. ROS:   Please see the history of present illness.    All 14 point review of systems negative except as described per history of present illness  EKGs/Labs/Other Studies Reviewed:      Recent Labs: 07/06/2019: ALT 35; BUN 49; Creatinine, Ser 1.12; Hemoglobin 13.8; Magnesium 1.7; Platelets 258; Potassium 3.8; Sodium 140  Recent Lipid Panel    Component Value Date/Time   CHOL 138 07/04/2019 0210   TRIG 35 07/04/2019 0210   HDL 36 (L) 07/04/2019 0210   CHOLHDL 3.8 07/04/2019 0210   VLDL 7 07/04/2019 0210   LDLCALC 95 07/04/2019 0210    Physical Exam:    VS:  BP (!) 160/60 (BP Location: Left Arm, Patient Position: Sitting)   Pulse 63   Ht 5\' 11"  (1.803 m)   Wt 216 lb (98 kg)   SpO2 92%   BMI 30.13 kg/m     Wt Readings from Last 3 Encounters:  05/19/20 216 lb (98 kg)  03/17/20 218 lb (98.9 kg)  03/03/20 217 lb (98.4 kg)     GEN:  Well nourished, well developed in no acute distress HEENT: Normal NECK: No JVD; No carotid bruits LYMPHATICS: No lymphadenopathy CARDIAC: RRR, no murmurs, no rubs, no gallops RESPIRATORY:  Clear to auscultation without rales, wheezing or rhonchi  ABDOMEN: Soft, non-tender, non-distended MUSCULOSKELETAL:  No edema; No deformity  SKIN: Warm and dry LOWER EXTREMITIES: no swelling NEUROLOGIC:  Alert and oriented x 3 PSYCHIATRIC:  Normal affect   ASSESSMENT:    1. Paroxysmal atrial fibrillation (HCC)   2. Near syncope   3. Profound sensorineural hearing loss (SNHL)   4. Diabetes mellitus type 2, uncontrolled, with complications ([place])   5. Former smoker   6. Paroxysmal ventricular tachycardia (HCC)    PLAN:    In order of problems listed above:  1. Paroxysmal atrial  fibrillation denies having any recent palpitations.  He is anticoagulated which I will continue. 2. Near syncope/dizziness look like this is orthostatic.  His monitor shows some runs of nonsustained ventricular tachycardia but those were asymptomatic he does have preserved left ventricle ejection fraction therefore overall is a low risk arrhythmia on top of that the way he describes his dizziness more feeling diagnosis of orthostatic hypotension then ventricular tachycardia. 3. Hearing loss and conversation with him is difficult.  Likely, his son was with him and it helps. 4. I have encouraged him to stay well-hydrated I asked his son to make sure he will will drink plenty of fluids, I will  discontinue his amlodipine and I will see him back in my office in about 3 months we did talk about elastic stockings.   Medication Adjustments/Labs and Tests Ordered: Current medicines are reviewed at length with the patient today.  Concerns regarding medicines are outlined above.  No orders of the defined types were placed in this encounter.  Medication changes: No orders of the defined types were placed in this encounter.   Signed, Park Liter, MD, [facility] 05/19/2020 3:09 PM    [place]

## 2020-05-19 NOTE — Patient Instructions (Signed)
Medication Instructions:  Your physician has recommended you make the following change in your medication:   STOP: Amlodipine  *If you need a refill on your cardiac medications before your next appointment, please call your pharmacy*   Lab Work: NONE If you have labs (blood work) drawn today and your tests are completely normal, you will receive your results only by: Marland Kitchen MyChart Message (if you have MyChart) OR . A paper copy in the mail If you have any lab test that is abnormal or we need to change your treatment, we will call you to review the results.   Testing/Procedures: NONE   Follow-Up: At [facility], you and your health needs are our priority.  As part of our continuing mission to provide you with exceptional heart care, we have created designated Provider Care Teams.  These Care Teams include your primary Cardiologist (physician) and Advanced Practice Providers (APPs -  Physician Assistants and Nurse Practitioners) who all work together to provide you with the care you need, when you need it.  We recommend signing up for the patient portal called "MyChart".  Sign up information is provided on this After Visit Summary.  MyChart is used to connect with patients for Virtual Visits (Telemedicine).  Patients are able to view lab/test results, encounter notes, upcoming appointments, etc.  Non-urgent messages can be sent to your provider as well.   To learn more about what you can do with MyChart, go to [email or web address].    Your next appointment:   3 month(s)  The format for your next appointment:   In Person  Provider:   Gypsy Balsam, MD   Other Instructions

## 2020-05-29 DIAGNOSIS — H401131 Primary open-angle glaucoma, bilateral, mild stage: Secondary | ICD-10-CM | POA: Diagnosis not present

## 2020-07-09 DIAGNOSIS — Z79899 Other long term (current) drug therapy: Secondary | ICD-10-CM | POA: Diagnosis not present

## 2020-07-09 DIAGNOSIS — Z6829 Body mass index (BMI) 29.0-29.9, adult: Secondary | ICD-10-CM | POA: Diagnosis not present

## 2020-07-09 DIAGNOSIS — E1140 Type 2 diabetes mellitus with diabetic neuropathy, unspecified: Secondary | ICD-10-CM | POA: Diagnosis not present

## 2020-07-09 DIAGNOSIS — I480 Paroxysmal atrial fibrillation: Secondary | ICD-10-CM | POA: Diagnosis not present

## 2020-07-09 DIAGNOSIS — E785 Hyperlipidemia, unspecified: Secondary | ICD-10-CM | POA: Diagnosis not present

## 2020-07-09 DIAGNOSIS — I10 Essential (primary) hypertension: Secondary | ICD-10-CM | POA: Diagnosis not present

## 2020-07-09 DIAGNOSIS — D509 Iron deficiency anemia, unspecified: Secondary | ICD-10-CM | POA: Diagnosis not present

## 2020-08-25 ENCOUNTER — Ambulatory Visit: Payer: Medicare Other | Admitting: Cardiology

## 2020-09-04 DIAGNOSIS — H401131 Primary open-angle glaucoma, bilateral, mild stage: Secondary | ICD-10-CM | POA: Diagnosis not present

## 2020-09-08 DIAGNOSIS — H905 Unspecified sensorineural hearing loss: Secondary | ICD-10-CM | POA: Diagnosis not present

## 2020-09-10 ENCOUNTER — Encounter: Payer: Self-pay | Admitting: Cardiology

## 2020-09-10 ENCOUNTER — Other Ambulatory Visit: Payer: Self-pay

## 2020-09-10 ENCOUNTER — Ambulatory Visit (INDEPENDENT_AMBULATORY_CARE_PROVIDER_SITE_OTHER): Payer: Medicare Other | Admitting: Cardiology

## 2020-09-10 VITALS — BP 118/72 | HR 55 | Ht 71.0 in | Wt 218.0 lb

## 2020-09-10 DIAGNOSIS — I951 Orthostatic hypotension: Secondary | ICD-10-CM

## 2020-09-10 DIAGNOSIS — I10 Essential (primary) hypertension: Secondary | ICD-10-CM | POA: Diagnosis not present

## 2020-09-10 DIAGNOSIS — I472 Ventricular tachycardia: Secondary | ICD-10-CM | POA: Diagnosis not present

## 2020-09-10 DIAGNOSIS — I4729 Other ventricular tachycardia: Secondary | ICD-10-CM

## 2020-09-10 DIAGNOSIS — I480 Paroxysmal atrial fibrillation: Secondary | ICD-10-CM

## 2020-09-10 MED ORDER — LISINOPRIL 20 MG PO TABS: 20.0000 mg | ORAL_TABLET | Freq: Every day | ORAL | 1 refills | Status: DC

## 2020-09-10 NOTE — Patient Instructions (Signed)
Medication Instructions:  Your physician has recommended you make the following change in your medication:   DECREASE: lisinopril 20 mg daily  *If you need a refill on your cardiac medications before your next appointment, please call your pharmacy*   Lab Work: None If you have labs (blood work) drawn today and your tests are completely normal, you will receive your results only by: [place] MyChart Message (if you have MyChart) OR . A paper copy in the mail If you have any lab test that is abnormal or we need to change your treatment, we will call you to review the results.   Testing/Procedures: None   Follow-Up: At [facility], you and your health needs are our priority.  As part of our continuing mission to provide you with exceptional heart care, we have created designated Provider Care Teams.  These Care Teams include your primary Cardiologist (physician) and Advanced Practice Providers (APPs -  Physician Assistants and Nurse Practitioners) who all work together to provide you with the care you need, when you need it.  We recommend signing up for the patient portal called "MyChart".  Sign up information is provided on this After Visit Summary.  MyChart is used to connect with patients for Virtual Visits (Telemedicine).  Patients are able to view lab/test results, encounter notes, upcoming appointments, etc.  Non-urgent messages can be sent to your provider as well.   To learn more about what you can do with MyChart, go to [email or web address].    Your next appointment:   5 month(s)  The format for your next appointment:   In Person  Provider:   Jenne Campus, MD   Other Instructions

## 2020-09-10 NOTE — Addendum Note (Signed)
Addended by: Senaida Ores on: 09/10/2020 02:47 PM   Modules accepted: Orders

## 2020-09-10 NOTE — Progress Notes (Signed)
Cardiology Office Note:    Date:  09/10/2020   ID:  David Steele, DOB 05-23-1925, MRN [id number]  PCP:  Ernestene Kiel, MD  Cardiologist:  Jenne Campus, MD    Referring MD: Ernestene Kiel, MD   Chief Complaint  Patient presents with  . Dizziness    Even after d/c Amlodipine  I am doing better  History of Present Illness:    David Steele is a 87 y.o. male who was referred originally to [place] because of episode of dizziness it looks I was clearly orthostatic and I decided to stop his amlodipine he comes today 2 months for follow-up he said he feels dramatically better Arps of dizziness are much less frequent.  I did put monitor on him which surprisingly show episode of nonsustained ventricular tachycardia he is asymptomatic but does.  He did have echocardiogram which showed preserved left ventricle ejection fraction.  He denies have any palpitation chest pain tightness squeezing pressure burning chest.  He still very active he like to work in the garden he works a lot.  I have to admit he is doing excellently for somebody his age.  Past Medical History:  Diagnosis Date  . Acute respiratory failure with hypoxia ([place]) 07/03/2019  . Advanced age 09/19/2018  . Anemia   . Anxiety   . Aortic atherosclerosis ([place])   . Benign essential HTN 02/23/2017  . Bladder cancer ([place])   . BPH (benign prostatic hyperplasia)   . Cerebral ischemia   . CKD (chronic kidney disease), stage III ([place]) 07/03/2019  . Current use of long term anticoagulation 04/19/2019  . Depressive disorder   . Diabetes mellitus   . Diabetes mellitus type 2, uncontrolled, with complications ([place]) 6/64/4034  . DOE (dyspnea on exertion) 02/23/2017  . Dyslipidemia 07/01/2019  . First degree AV block 12/14/2017  . Former smoker 06/15/2017  . History of bladder cancer 02/23/2017  . HLD (hyperlipidemia) 07/03/2019  . Hyperlipidemia   . Hypertension   . Light headed 02/23/2017  . Near syncope 02/23/2017  .  Orthostatic hypotension 08/31/2010  . Other fatigue 02/23/2017  . Paroxysmal atrial fibrillation ([place]) 02/23/2017  . Paroxysmal ventricular tachycardia ([place]) 05/19/2020  . Plantar fasciitis   . Pneumonia due to COVID-19 virus 07/01/2019  . Profound sensorineural hearing loss (SNHL) 11/13/2017   Formatting of this note might be different from the original. Added automatically from request for surgery [id number]  . Prostate cancer ([place]) 07/03/2019  . Sinus bradycardia 02/23/2017  . T2DM (type 2 diabetes mellitus) ([place]) 07/01/2019    Past Surgical History:  Procedure Laterality Date  . BLADDER SURGERY  2007   Cancerous tumor removed  . EYE SURGERY    . TONSILLECTOMY      Current Medications: Current Meds  Medication Sig  . acetaminophen (TYLENOL) 650 MG CR tablet Take 650 mg by mouth daily.  Marland Kitchen apixaban (ELIQUIS) 5 MG TABS tablet Take 5 mg by mouth 2 (two) times daily.  . Brinzolamide-Brimonidine (SIMBRINZA) 1-0.2 % SUSP Apply 1 drop to eye in the morning, at noon, and at bedtime.  . finasteride (PROSCAR) 5 MG tablet Take 5 mg by mouth daily.  . insulin NPH-regular Human (70-30) 100 UNIT/ML injection Inject 30 Units into the skin 2 (two) times daily with a meal. 30 units before meal in AM and 25 units in evening before meal Subcutaneous Patient uses vials  . metFORMIN (GLUCOPHAGE) 1000 MG tablet Take 1,000 mg by mouth 2 (two) times daily with a meal.   . mirabegron  ER (MYRBETRIQ) 50 MG TB24 tablet Take 50 mg by mouth at bedtime.  Marland Kitchen PARoxetine (PAXIL) 30 MG tablet Take 30 mg by mouth daily.     Allergies:   Simvastatin   Social History   Socioeconomic History  . Marital status: Single    Spouse name: Not on file  . Number of children: Not on file  . Years of education: Not on file  . Highest education level: Not on file  Occupational History  . Occupation: reired  Tobacco Use  . Smoking status: Former Smoker    Quit date: 05/16/1988    Years since quitting: 32.3  . Smokeless tobacco:  Never Used  Vaping Use  . Vaping Use: Former  Substance and Sexual Activity  . Alcohol use: No  . Drug use: No  . Sexual activity: Not Currently  Other Topics Concern  . Not on file  Social History Narrative  . Not on file   Social Determinants of Health   Financial Resource Strain: Not on file  Food Insecurity: Not on file  Transportation Needs: Not on file  Physical Activity: Not on file  Stress: Not on file  Social Connections: Not on file     Family History: The patient's family history is not on file. ROS:   Please see the history of present illness.    All 14 point review of systems negative except as described per history of present illness  EKGs/Labs/Other Studies Reviewed:      Recent Labs: No results found for requested labs within last 8760 hours.  Recent Lipid Panel    Component Value Date/Time   CHOL 138 07/04/2019 0210   TRIG 35 07/04/2019 0210   HDL 36 (L) 07/04/2019 0210   CHOLHDL 3.8 07/04/2019 0210   VLDL 7 07/04/2019 0210   LDLCALC 95 07/04/2019 0210    Physical Exam:    VS:  BP 118/72 (BP Location: Left Arm, Patient Position: Sitting)   Pulse (!) 55   Ht 5\' 11"  (1.803 m)   Wt 218 lb (98.9 kg)   SpO2 98%   BMI 30.40 kg/m     Wt Readings from Last 3 Encounters:  09/10/20 218 lb (98.9 kg)  05/19/20 216 lb (98 kg)  03/17/20 218 lb (98.9 kg)     GEN:  Well nourished, well developed in no acute distress HEENT: Normal NECK: No JVD; No carotid bruits LYMPHATICS: No lymphadenopathy CARDIAC: RRR, no murmurs, no rubs, no gallops RESPIRATORY:  Clear to auscultation without rales, wheezing or rhonchi  ABDOMEN: Soft, non-tender, non-distended MUSCULOSKELETAL:  No edema; No deformity  SKIN: Warm and dry LOWER EXTREMITIES: no swelling NEUROLOGIC:  Alert and oriented x 3 PSYCHIATRIC:  Normal affect   ASSESSMENT:    1. Orthostatic hypotension   2. Paroxysmal atrial fibrillation (HCC)   3. Nonsustained ventricular tachycardia ([place])   4.  Benign essential HTN    PLAN:    In order of problems listed above:  1. Orthostatic hypotension improved but still present I will cut down his lisinopril from 40 to 20 mg daily, I will encourage him to drink plenty of fluids. 2. Paroxysmal atrial fibrillation, anticoagulated, no palpitations continue present management. 3. Nonsustained ventricular tachycardia denies having any dizziness except 1 episodes which look like again orthostatic hypotension when he get up and try to walk. 4. Benign essential hypertension well-controlled.   Medication Adjustments/Labs and Tests Ordered: Current medicines are reviewed at length with the patient today.  Concerns regarding medicines are outlined above.  No orders of the defined types were placed in this encounter.  Medication changes: No orders of the defined types were placed in this encounter.   Signed, Park Liter, MD, [facility] 09/10/2020 2:37 PM    [place]

## 2020-12-16 DIAGNOSIS — L219 Seborrheic dermatitis, unspecified: Secondary | ICD-10-CM | POA: Diagnosis not present

## 2021-01-01 DIAGNOSIS — H401131 Primary open-angle glaucoma, bilateral, mild stage: Secondary | ICD-10-CM | POA: Diagnosis not present

## 2021-01-05 DIAGNOSIS — Z4889 Encounter for other specified surgical aftercare: Secondary | ICD-10-CM | POA: Diagnosis not present

## 2021-01-05 DIAGNOSIS — H903 Sensorineural hearing loss, bilateral: Secondary | ICD-10-CM | POA: Diagnosis not present

## 2021-01-05 DIAGNOSIS — Z9621 Cochlear implant status: Secondary | ICD-10-CM | POA: Diagnosis not present

## 2021-01-05 DIAGNOSIS — Z6830 Body mass index (BMI) 30.0-30.9, adult: Secondary | ICD-10-CM | POA: Diagnosis not present

## 2021-01-13 DIAGNOSIS — Z79899 Other long term (current) drug therapy: Secondary | ICD-10-CM | POA: Diagnosis not present

## 2021-01-13 DIAGNOSIS — F320 Major depressive disorder, single episode, mild: Secondary | ICD-10-CM | POA: Diagnosis not present

## 2021-01-13 DIAGNOSIS — I480 Paroxysmal atrial fibrillation: Secondary | ICD-10-CM | POA: Diagnosis not present

## 2021-01-13 DIAGNOSIS — Z1331 Encounter for screening for depression: Secondary | ICD-10-CM | POA: Diagnosis not present

## 2021-01-13 DIAGNOSIS — F419 Anxiety disorder, unspecified: Secondary | ICD-10-CM | POA: Diagnosis not present

## 2021-01-13 DIAGNOSIS — E785 Hyperlipidemia, unspecified: Secondary | ICD-10-CM | POA: Diagnosis not present

## 2021-01-13 DIAGNOSIS — M1711 Unilateral primary osteoarthritis, right knee: Secondary | ICD-10-CM | POA: Diagnosis not present

## 2021-01-13 DIAGNOSIS — I10 Essential (primary) hypertension: Secondary | ICD-10-CM | POA: Diagnosis not present

## 2021-01-13 DIAGNOSIS — E1140 Type 2 diabetes mellitus with diabetic neuropathy, unspecified: Secondary | ICD-10-CM | POA: Diagnosis not present

## 2021-01-13 DIAGNOSIS — K30 Functional dyspepsia: Secondary | ICD-10-CM | POA: Diagnosis not present

## 2021-01-13 DIAGNOSIS — Z0000 Encounter for general adult medical examination without abnormal findings: Secondary | ICD-10-CM | POA: Diagnosis not present

## 2021-01-21 DIAGNOSIS — M1711 Unilateral primary osteoarthritis, right knee: Secondary | ICD-10-CM | POA: Diagnosis not present

## 2021-02-10 ENCOUNTER — Other Ambulatory Visit: Payer: Self-pay

## 2021-02-10 ENCOUNTER — Ambulatory Visit (INDEPENDENT_AMBULATORY_CARE_PROVIDER_SITE_OTHER): Payer: Medicare Other | Admitting: Cardiology

## 2021-02-10 ENCOUNTER — Encounter: Payer: Self-pay | Admitting: Cardiology

## 2021-02-10 VITALS — BP 162/84 | HR 56 | Ht 71.0 in | Wt 215.6 lb

## 2021-02-10 DIAGNOSIS — I480 Paroxysmal atrial fibrillation: Secondary | ICD-10-CM

## 2021-02-10 DIAGNOSIS — E118 Type 2 diabetes mellitus with unspecified complications: Secondary | ICD-10-CM | POA: Diagnosis not present

## 2021-02-10 DIAGNOSIS — I951 Orthostatic hypotension: Secondary | ICD-10-CM

## 2021-02-10 DIAGNOSIS — IMO0002 Reserved for concepts with insufficient information to code with codable children: Secondary | ICD-10-CM

## 2021-02-10 DIAGNOSIS — I472 Ventricular tachycardia: Secondary | ICD-10-CM | POA: Diagnosis not present

## 2021-02-10 DIAGNOSIS — I4729 Other ventricular tachycardia: Secondary | ICD-10-CM

## 2021-02-10 DIAGNOSIS — E1165 Type 2 diabetes mellitus with hyperglycemia: Secondary | ICD-10-CM | POA: Diagnosis not present

## 2021-02-10 DIAGNOSIS — I10 Essential (primary) hypertension: Secondary | ICD-10-CM

## 2021-02-10 NOTE — Progress Notes (Signed)
Cardiology Office Note:    Date:  02/10/2021   ID:  David Steele, DOB 01/26/1926, MRN [id number]  PCP:  Ernestene Kiel, MD  Cardiologist:  Jenne Campus, MD    Referring MD: Ernestene Kiel, MD   Chief Complaint  Patient presents with   Dizziness    History of Present Illness:    David Steele is a 87 y.o. male with past medical history significant for dizziness he did look clearly orthostatic at that time we stopped amlodipine which make him feel much better I also interestingly on the monitor did not put on him and we find episodes of nonsustained ventricular tachycardia it was completely asymptomatic he did have echocardiogram showed preserved left ventricle ejection fraction.  He comes today to my office for follow-up he said he is feeling well he did not have any problem until this weekend when he started feeling cold symptoms and he also got 2 days of dizziness but now everything comes back to normal he is back to himself have no difficulties.  Like always he comes to my office with his son.  He denies have any chest pain tightness squeezing pressure burning chest no swelling of lower extremities  Past Medical History:  Diagnosis Date   Acute respiratory failure with hypoxia ([place]) 07/03/2019   Advanced age 64/10/2018   Anemia    Anxiety    Aortic atherosclerosis (HCC)    Benign essential HTN 02/23/2017   Bladder cancer ([place])    BPH (benign prostatic hyperplasia)    Cerebral ischemia    CKD (chronic kidney disease), stage III ([place]) 07/03/2019   Current use of long term anticoagulation 04/19/2019   Depressive disorder    Diabetes mellitus    Diabetes mellitus type 2, uncontrolled, with complications ([place]) 8/45/3646   DOE (dyspnea on exertion) 02/23/2017   Dyslipidemia 07/01/2019   First degree AV block 12/14/2017   Former smoker 06/15/2017   History of bladder cancer 02/23/2017   HLD (hyperlipidemia) 07/03/2019   Hyperlipidemia    Hypertension    Light headed  02/23/2017   Near syncope 02/23/2017   Orthostatic hypotension 08/31/2010   Other fatigue 02/23/2017   Paroxysmal atrial fibrillation ([place]) 02/23/2017   Paroxysmal ventricular tachycardia ([place]) 05/19/2020   Plantar fasciitis    Pneumonia due to COVID-19 virus 07/01/2019   Profound sensorineural hearing loss (SNHL) 11/13/2017   Formatting of this note might be different from the original. Added automatically from request for surgery [id number]   Prostate cancer ([place]) 07/03/2019   Sinus bradycardia 02/23/2017   T2DM (type 2 diabetes mellitus) ([place]) 07/01/2019    Past Surgical History:  Procedure Laterality Date   BLADDER SURGERY  2007   Cancerous tumor removed   EYE SURGERY     TONSILLECTOMY      Current Medications: Current Meds  Medication Sig   acetaminophen (TYLENOL) 650 MG CR tablet Take 650 mg by mouth daily.   apixaban (ELIQUIS) 5 MG TABS tablet Take 5 mg by mouth 2 (two) times daily.   Brinzolamide-Brimonidine (SIMBRINZA) 1-0.2 % SUSP Apply 1 drop to eye in the morning, at noon, and at bedtime.   ferrous sulfate 325 (65 FE) MG tablet Take 325 mg by mouth daily with breakfast.   finasteride (PROSCAR) 5 MG tablet Take 5 mg by mouth daily.   insulin NPH-regular Human (70-30) 100 UNIT/ML injection Inject 30 Units into the skin 2 (two) times daily with a meal. 30 units before meal in AM and 25 units in evening before meal  Subcutaneous Patient uses vials   lansoprazole (PREVACID) 30 MG capsule Take 30 mg by mouth daily.   lisinopril (ZESTRIL) 20 MG tablet Take 1 tablet (20 mg total) by mouth daily.   metFORMIN (GLUCOPHAGE) 1000 MG tablet Take 1,000 mg by mouth 2 (two) times daily with a meal.    mirabegron ER (MYRBETRIQ) 50 MG TB24 tablet Take 50 mg by mouth at bedtime.   Multiple Vitamins-Minerals (CENTRUM SILVER ADULT 50+ PO) Take 1 tablet by mouth daily. Unknown strength   PARoxetine (PAXIL) 30 MG tablet Take 30 mg by mouth daily.     Allergies:   Simvastatin   Social History    Socioeconomic History   Marital status: Single    Spouse name: Not on file   Number of children: Not on file   Years of education: Not on file   Highest education level: Not on file  Occupational History   Occupation: reired  Tobacco Use   Smoking status: Former    Types: Cigarettes    Quit date: 05/16/1988    Years since quitting: 32.7   Smokeless tobacco: Never  Vaping Use   Vaping Use: Former  Substance and Sexual Activity   Alcohol use: No   Drug use: No   Sexual activity: Not Currently  Other Topics Concern   Not on file  Social History Narrative   Not on file   Social Determinants of Health   Financial Resource Strain: Not on file  Food Insecurity: Not on file  Transportation Needs: Not on file  Physical Activity: Not on file  Stress: Not on file  Social Connections: Not on file     Family History: The patient's family history is not on file. ROS:   Please see the history of present illness.    All 14 point review of systems negative except as described per history of present illness  EKGs/Labs/Other Studies Reviewed:      Recent Labs: No results found for requested labs within last 8760 hours.  Recent Lipid Panel    Component Value Date/Time   CHOL 138 07/04/2019 0210   TRIG 35 07/04/2019 0210   HDL 36 (L) 07/04/2019 0210   CHOLHDL 3.8 07/04/2019 0210   VLDL 7 07/04/2019 0210   LDLCALC 95 07/04/2019 0210    Physical Exam:    VS:  BP (!) 162/84 (BP Location: Left Arm, Patient Position: Sitting)   Pulse (!) 56   Ht 5\' 11"  (1.803 m)   Wt 215 lb 9.6 oz (97.8 kg)   SpO2 97%   BMI 30.07 kg/m     Wt Readings from Last 3 Encounters:  02/10/21 215 lb 9.6 oz (97.8 kg)  09/10/20 218 lb (98.9 kg)  05/19/20 216 lb (98 kg)     GEN:  Well nourished, well developed in no acute distress HEENT: Normal NECK: No JVD; No carotid bruits LYMPHATICS: No lymphadenopathy CARDIAC: RRR, no murmurs, no rubs, no gallops RESPIRATORY:  Clear to auscultation  without rales, wheezing or rhonchi  ABDOMEN: Soft, non-tender, non-distended MUSCULOSKELETAL:  No edema; No deformity  SKIN: Warm and dry LOWER EXTREMITIES: no swelling NEUROLOGIC:  Alert and oriented x 3 PSYCHIATRIC:  Normal affect   ASSESSMENT:    1. Orthostatic hypotension   2. Paroxysmal atrial fibrillation (HCC)   3. Nonsustained ventricular tachycardia ([place])   4. Benign essential HTN   5. Diabetes mellitus type 2, uncontrolled, with complications ([place])    PLAN:    In order of problems listed above:  Orthostatic hypotension.  His blood pressure slightly elevated today but I will accept it may in goal of trying to prevent passing out.  I will continue present management. Paroxysmal atrial fibrillation he is anticoagulated which I will continue. Nonsustained ventricular tachycardia asymptomatic no dizziness no passing out except as described above and the dizziness that he describes are clearly orthostatic Type 2 diabetes to be followed by antimedicine team last hemoglobin A1c I see from K PN is 7.5 this is from July 09, 2020.  Still should be better controlled. Dyslipidemia I did review his K PN which show LDL of 130 and HDL 41.  He deserves to be on statin however benefits of starting somebody was 87 years old is unclear.  We will continue discussion with him about potentially starting some medication   Medication Adjustments/Labs and Tests Ordered: Current medicines are reviewed at length with the patient today.  Concerns regarding medicines are outlined above.  No orders of the defined types were placed in this encounter.  Medication changes: No orders of the defined types were placed in this encounter.   Signed, Park Liter, MD, [facility] 02/10/2021 1:21 PM    [place]

## 2021-02-10 NOTE — Patient Instructions (Signed)
Medication Instructions:  Your physician recommends that you continue on your current medications as directed. Please refer to the Current Medication list given to you today.  *If you need a refill on your cardiac medications before your next appointment, please call your pharmacy*   Lab Work: none If you have labs (blood work) drawn today and your tests are completely normal, you will receive your results only by: [place] (if you have MyChart) OR A paper copy in the mail If you have any lab test that is abnormal or we need to change your treatment, we will call you to review the results.   Testing/Procedures: none   Follow-Up: At [facility], you and your health needs are our priority.  As part of our continuing mission to provide you with exceptional heart care, we have created designated Provider Care Teams.  These Care Teams include your primary Cardiologist (physician) and Advanced Practice Providers (APPs -  Physician Assistants and Nurse Practitioners) who all work together to provide you with the care you need, when you need it.  We recommend signing up for the patient portal called "MyChart".  Sign up information is provided on this After Visit Summary.  MyChart is used to connect with patients for Virtual Visits (Telemedicine).  Patients are able to view lab/test results, encounter notes, upcoming appointments, etc.  Non-urgent messages can be sent to your provider as well.   To learn more about what you can do with MyChart, go to [email or web address].    Your next appointment:   6 month(s)  The format for your next appointment:   In Person  Provider:   Jenne Campus, MD   Other Instructions

## 2021-02-18 DIAGNOSIS — Z23 Encounter for immunization: Secondary | ICD-10-CM | POA: Diagnosis not present

## 2021-04-06 ENCOUNTER — Other Ambulatory Visit: Payer: Self-pay | Admitting: Cardiology

## 2021-04-17 DIAGNOSIS — J019 Acute sinusitis, unspecified: Secondary | ICD-10-CM | POA: Diagnosis not present

## 2021-05-04 DIAGNOSIS — Z8551 Personal history of malignant neoplasm of bladder: Secondary | ICD-10-CM | POA: Diagnosis not present

## 2021-05-04 DIAGNOSIS — F320 Major depressive disorder, single episode, mild: Secondary | ICD-10-CM | POA: Diagnosis not present

## 2021-05-04 DIAGNOSIS — R1030 Lower abdominal pain, unspecified: Secondary | ICD-10-CM | POA: Diagnosis not present

## 2021-05-04 DIAGNOSIS — Z6830 Body mass index (BMI) 30.0-30.9, adult: Secondary | ICD-10-CM | POA: Diagnosis not present

## 2021-05-04 DIAGNOSIS — Z9109 Other allergy status, other than to drugs and biological substances: Secondary | ICD-10-CM | POA: Diagnosis not present

## 2021-05-05 DIAGNOSIS — R3915 Urgency of urination: Secondary | ICD-10-CM | POA: Diagnosis not present

## 2021-05-05 DIAGNOSIS — C61 Malignant neoplasm of prostate: Secondary | ICD-10-CM | POA: Diagnosis not present

## 2021-05-05 DIAGNOSIS — R972 Elevated prostate specific antigen [PSA]: Secondary | ICD-10-CM | POA: Diagnosis not present

## 2021-05-05 DIAGNOSIS — C679 Malignant neoplasm of bladder, unspecified: Secondary | ICD-10-CM | POA: Diagnosis not present

## 2021-05-13 DIAGNOSIS — H40003 Preglaucoma, unspecified, bilateral: Secondary | ICD-10-CM | POA: Diagnosis not present

## 2021-05-14 DIAGNOSIS — R1030 Lower abdominal pain, unspecified: Secondary | ICD-10-CM | POA: Diagnosis not present

## 2021-05-14 DIAGNOSIS — R109 Unspecified abdominal pain: Secondary | ICD-10-CM | POA: Diagnosis not present

## 2021-05-19 ENCOUNTER — Other Ambulatory Visit: Payer: Self-pay | Admitting: Family

## 2021-05-19 DIAGNOSIS — Z8551 Personal history of malignant neoplasm of bladder: Secondary | ICD-10-CM

## 2021-05-19 DIAGNOSIS — J918 Pleural effusion in other conditions classified elsewhere: Secondary | ICD-10-CM

## 2021-05-19 DIAGNOSIS — N289 Disorder of kidney and ureter, unspecified: Secondary | ICD-10-CM

## 2021-05-30 ENCOUNTER — Other Ambulatory Visit: Payer: Self-pay

## 2021-05-30 ENCOUNTER — Ambulatory Visit
Admission: RE | Admit: 2021-05-30 | Discharge: 2021-05-30 | Disposition: A | Discharge status: Home / Self Care | Payer: Medicare Other | Source: Ambulatory Visit | Attending: Family | Admitting: Family

## 2021-05-30 DIAGNOSIS — Z8551 Personal history of malignant neoplasm of bladder: Secondary | ICD-10-CM

## 2021-05-30 DIAGNOSIS — N289 Disorder of kidney and ureter, unspecified: Secondary | ICD-10-CM

## 2021-05-30 DIAGNOSIS — K769 Liver disease, unspecified: Secondary | ICD-10-CM

## 2021-06-03 DIAGNOSIS — H401131 Primary open-angle glaucoma, bilateral, mild stage: Secondary | ICD-10-CM | POA: Diagnosis not present

## 2021-06-07 DIAGNOSIS — Z45321 Encounter for adjustment and management of cochlear device: Secondary | ICD-10-CM | POA: Diagnosis not present

## 2021-06-07 DIAGNOSIS — H905 Unspecified sensorineural hearing loss: Secondary | ICD-10-CM | POA: Diagnosis not present

## 2021-06-13 ENCOUNTER — Other Ambulatory Visit: Payer: Self-pay

## 2021-06-13 ENCOUNTER — Ambulatory Visit
Admission: RE | Admit: 2021-06-13 | Discharge: 2021-06-13 | Disposition: A | Discharge status: Home / Self Care | Payer: Medicare Other | Source: Ambulatory Visit | Attending: Family | Admitting: Family

## 2021-06-13 DIAGNOSIS — K4090 Unilateral inguinal hernia, without obstruction or gangrene, not specified as recurrent: Secondary | ICD-10-CM | POA: Diagnosis not present

## 2021-06-13 DIAGNOSIS — N2889 Other specified disorders of kidney and ureter: Secondary | ICD-10-CM | POA: Diagnosis not present

## 2021-06-13 DIAGNOSIS — N289 Disorder of kidney and ureter, unspecified: Secondary | ICD-10-CM

## 2021-06-13 DIAGNOSIS — K769 Liver disease, unspecified: Secondary | ICD-10-CM | POA: Diagnosis not present

## 2021-06-13 DIAGNOSIS — K5730 Diverticulosis of large intestine without perforation or abscess without bleeding: Secondary | ICD-10-CM | POA: Diagnosis not present

## 2021-06-13 DIAGNOSIS — Z8551 Personal history of malignant neoplasm of bladder: Secondary | ICD-10-CM

## 2021-06-13 DIAGNOSIS — J918 Pleural effusion in other conditions classified elsewhere: Secondary | ICD-10-CM

## 2021-06-13 IMAGING — MR MR ABDOMEN WO/W CM
16 of 17 series · 45 of 48 positions shown · IV contrast (multihance)
Comparison: CT on [DATE] from [facility]

CLINICAL DATA: Renal and hepatic lesions on recent CT. Personal
history of bladder carcinoma.

EXAM:
MRI ABDOMEN AND PELVIS WITHOUT AND WITH CONTRAST
TECHNIQUE: Multiplanar multisequence MR imaging of the abdomen and pelvis was
performed both before and after the administration of intravenous
contrast.
CONTRAST:  20mL MULTIHANCE GADOBENATE DIMEGLUMINE 529 MG/ML IV SOLN

[Series 3: T2 · coronal · 5.0mm · 1.56mm/px · 1 of 50 slices shown (1 of 3)]
[im 1/50]
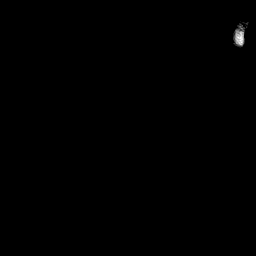

[Series 4: T1 · axial · 3.0mm · 1.31mm/px · z∈[-171,+114]mm · 6 of 192 slices shown]
[im 1/192]
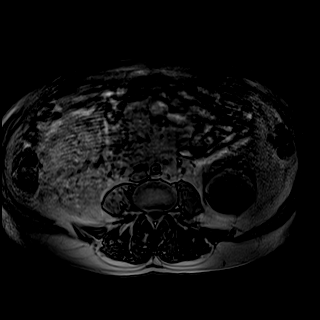
[im 39/192]
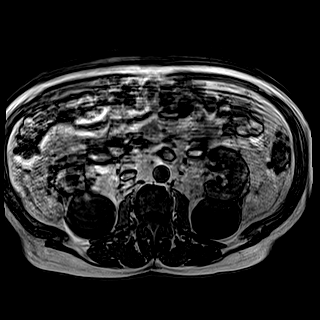
[im 77/192]
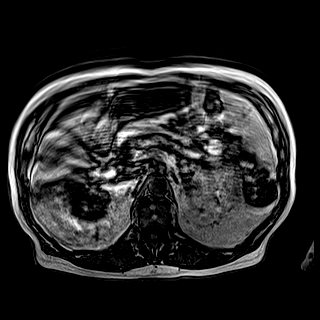
[im 115/192]
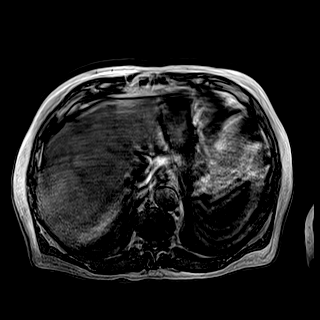
[im 153/192]
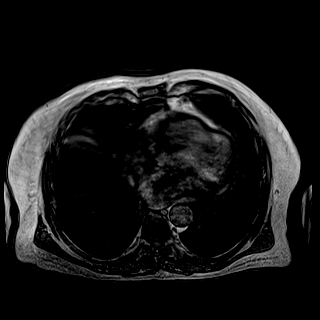
[im 192/192]
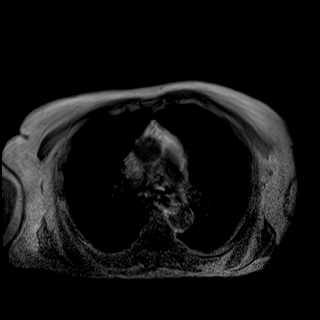

[Series 5: bSSFP · axial · 5.0mm · 1.31mm/px · z∈[-227,+97]mm · 2 of 55 slices shown]
[im 1/55]
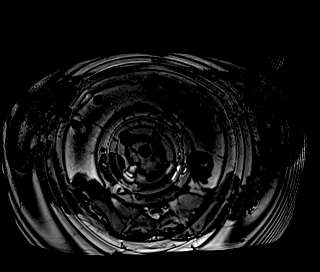
[im 55/55]
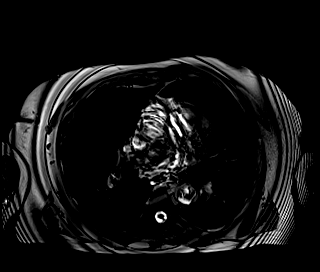

[Series 6: T2 · axial · 5.0mm · 1.64mm/px · z∈[-209,+115]mm · 2 of 55 slices shown (2 of 3)]
[im 1/55]
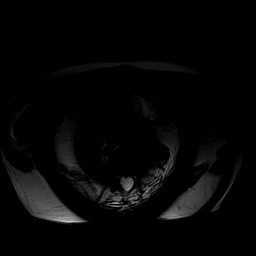
[im 55/55]
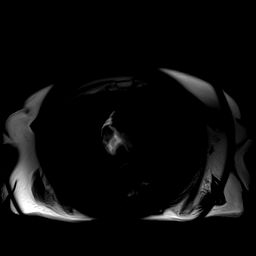

[Series 7: DWI · axial · 5.0mm · 1.57mm/px · z∈[-228,+96]mm · 5 of 165 slices shown (1 of 2)]
[im 1/165]
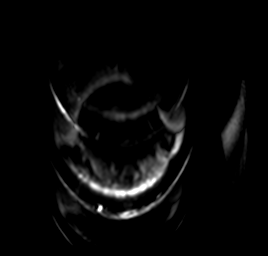
[im 42/165]
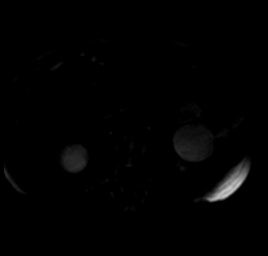
[im 83/165]
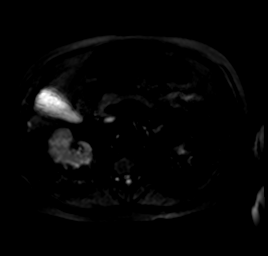
[im 124/165]
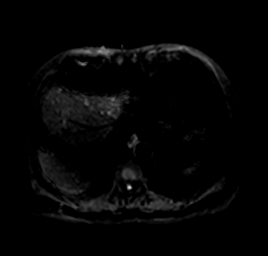
[im 165/165]
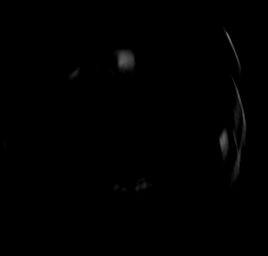

[Series 8: DWI · axial · 5.0mm · 1.57mm/px · z∈[-228,+96]mm · 2 of 55 slices shown (2 of 2)]
[im 1/55]
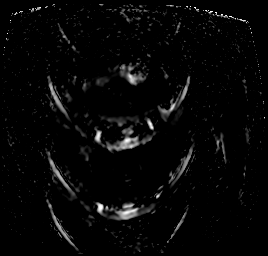
[im 55/55]
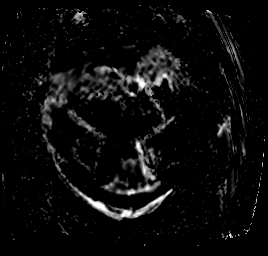

[Series 9: T2 · axial · 6.0mm · 1.31mm/px · 1 of 50 slices shown (3 of 3)]
[im 1/50]
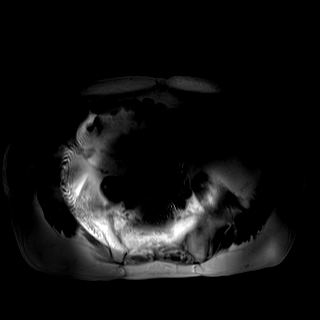

[Series 10: T1 dynamic · axial · non-contrast · 3.0mm · 1.31mm/px · z∈[-210,+99]mm · 3 of 104 slices shown]
[im 1/104]
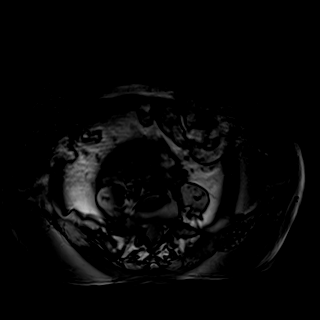
[im 52/104]
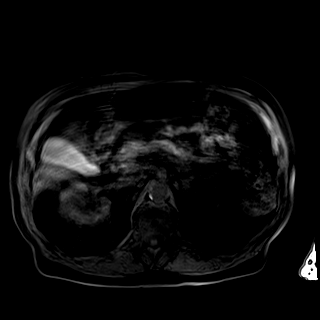
[im 104/104]
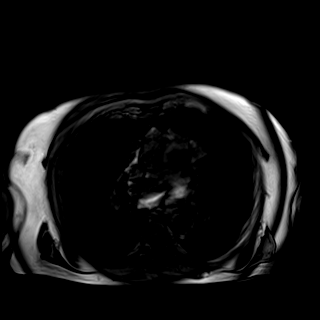

[Series 11: T1 dynamic post-contrast · axial · 3.0mm · 1.31mm/px · z∈[-210,+99]mm · 3 of 104 slices shown (1 of 8)]
[im 1/104]
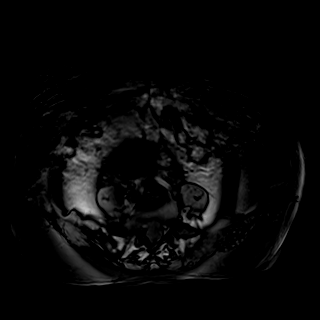
[im 52/104]
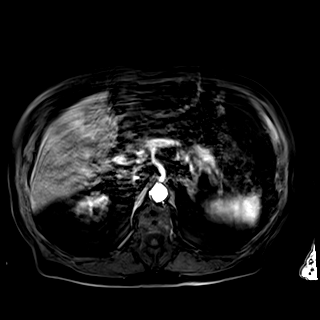
[im 104/104]
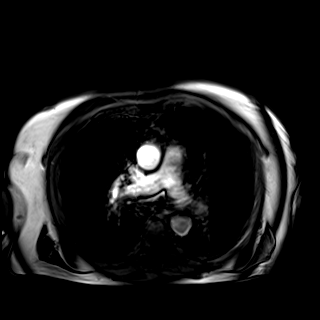

[Series 12: T1 dynamic post-contrast · axial · 3.0mm · 1.31mm/px · z∈[-210,+99]mm · 3 of 104 slices shown (2 of 8)]
[im 1/104]
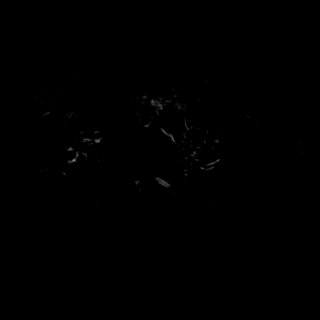
[im 52/104]
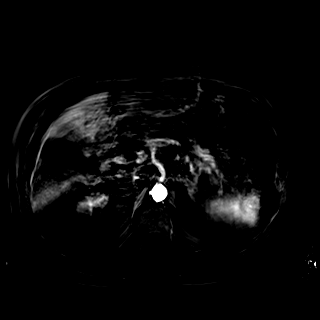
[im 104/104]
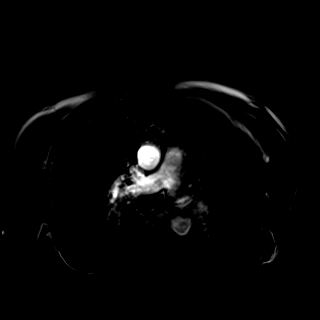

[Series 13: T1 dynamic post-contrast · axial · 3.0mm · 1.31mm/px · z∈[-210,+99]mm · 3 of 104 slices shown (3 of 8)]
[im 1/104]
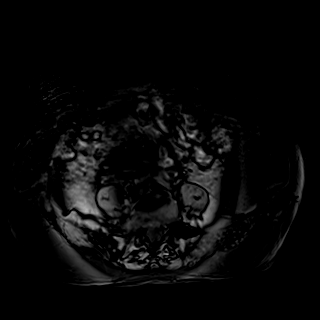
[im 52/104]
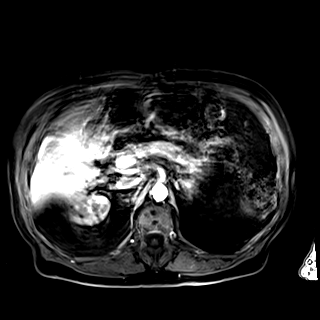
[im 104/104]
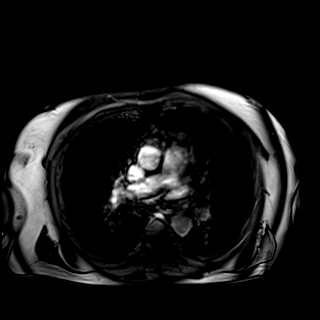

[Series 14: T1 dynamic post-contrast · axial · 3.0mm · 1.31mm/px · z∈[-210,+99]mm · 3 of 104 slices shown (4 of 8)]
[im 1/104]
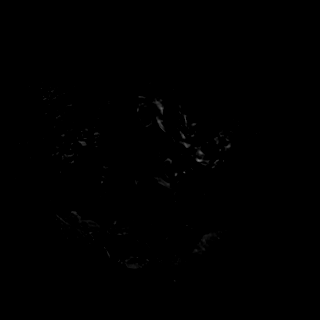
[im 52/104]
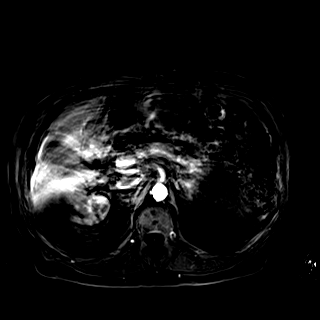
[im 104/104]
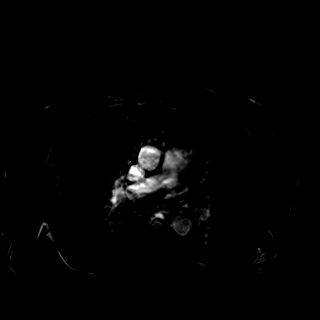

[Series 15: T1 dynamic post-contrast · axial · 3.0mm · 1.31mm/px · z∈[-210,+99]mm · 3 of 104 slices shown (5 of 8)]
[im 1/104]
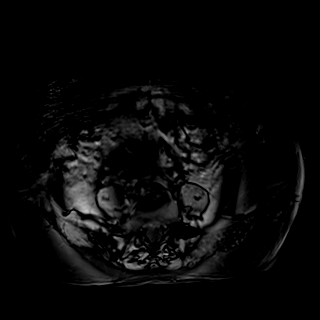
[im 52/104]
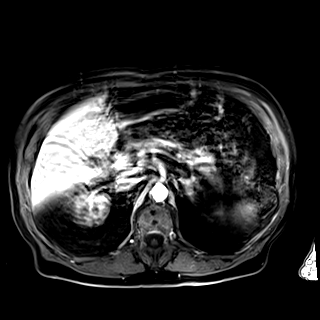
[im 104/104]
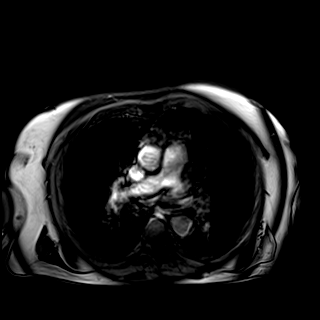

[Series 16: T1 dynamic post-contrast · axial · 3.0mm · 1.31mm/px · z∈[-210,+99]mm · 3 of 104 slices shown (6 of 8)]
[im 1/104]
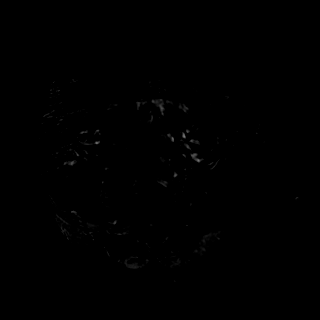
[im 52/104]
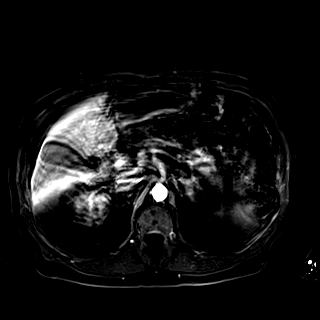
[im 104/104]
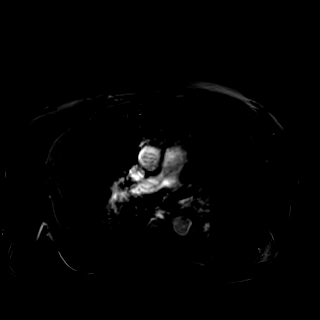

[Series 17: T1 dynamic post-contrast · coronal · 3.0mm · 1.31mm/px · 2 of 72 slices shown (7 of 8)]
[im 1/72]
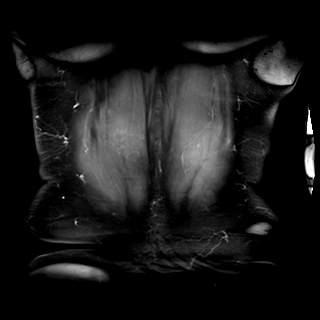
[im 72/72]
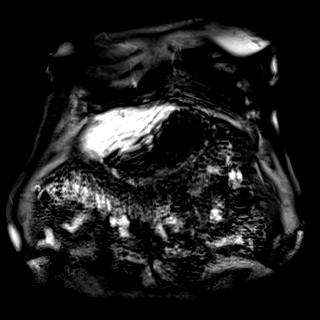

[Series 18: T1 dynamic post-contrast · axial · 3.0mm · 1.31mm/px · z∈[-210,+99]mm · 3 of 104 slices shown (8 of 8)]
[im 1/104]
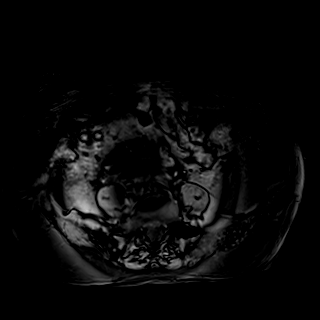
[im 52/104]
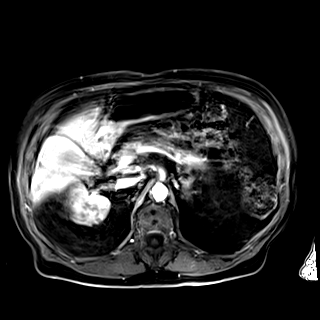
[im 104/104]
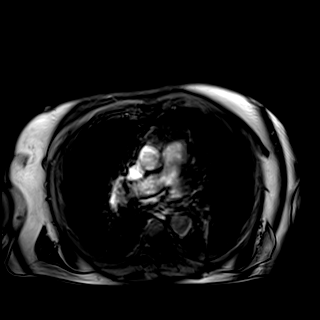

[45 of 48 positions shown; findings below may reference images not displayed]

FINDINGS: COMBINED FINDINGS FOR BOTH MR ABDOMEN AND PELVIS

Lower Chest: No acute findings.

Hepatobiliary: Image degradation by motion artifact noted. Numerous
tiny sub-cm T2 hyperintense lesions are seen throughout the liver,
which are too small to characterize but most likely represent tiny
cysts. A larger lesion is seen in the anterior aspect of segment 4A
which measures 1.6 x 1.5 cm on image 35/15. This shows moderate T2
hypointensity, but dynamic postcontrast sequences are nondiagnostic
due to motion artifact.

Mild diffuse gallbladder wall thickening is seen, however
gallbladder is nondilated, and no evidence of pericholecystic
inflammatory changes or fluid. No definite gallstones noted on this
exam. No evidence of biliary ductal dilatation.

Pancreas:  No mass or inflammatory changes.

Spleen: Within normal limits in size and appearance.

Adrenals/Urinary Tract: Normal adrenal glands. Several simple cysts
are seen in both kidneys, largest measuring approximately 7 cm. A
1.2 cm benign Bosniak category 2 subcapsular cyst is noted in the
upper pole of the right kidney.

A heterogeneously enhancing bilobed mass is seen in the posterior
midpole of the right kidney which measures 5.7 x 4.7 cm (image
33/6). This likely represents a single solid mass rather than 2
adjacent separate masses. This is consistent with renal cell
carcinoma. No evidence of hydronephrosis.

Diffuse bladder wall thickening and trabeculation is seen with
multiple small bladder diverticula, consistent with chronic bladder
outlet obstruction.

Stomach/Bowel: No evidence bowel obstruction, inflammatory process,
or abnormal fluid collections. Diffuse colonic diverticulosis is
noted, however there are no signs of diverticulitis.

Vascular/Lymphatic: No pathologically enlarged lymph nodes. No acute
vascular findings. No evidence of renal vein or IVC thrombus.

Reproductive:  Moderately enlarged prostate.

Other: Small right inguinal hernia is seen, which contains only fat.

Musculoskeletal:  No suspicious bone lesions identified.
IMPRESSION: Image degradation by motion artifact noted.

5.7 cm solid bilobed mass in the midpole of the right kidney,
consistent with renal cell carcinoma.

No evidence of metastatic disease.

Numerous tiny sub-cm T2 hyperintense liver lesions are too small to
characterize, but most likely represent tiny cysts.

1.6 cm indeterminate hypovascular lesion in the anterior left
hepatic lobe. Recommend continued follow-up by CT in 3 months
(rather than MRI due to motion artifact seen on today's exam).

Colonic diverticulosis. No radiographic evidence of diverticulitis.

Moderately enlarged prostate, with findings of chronic bladder
outlet obstruction.

Small right inguinal hernia, which contains only fat.

## 2021-06-13 IMAGING — MR MR PELVIS WO/W CM
8 series · 33 of 48 positions shown · IV contrast (multihance)
Comparison: CT on [DATE] from [facility]

CLINICAL DATA: Renal and hepatic lesions on recent CT. Personal
history of bladder carcinoma.

EXAM:
MRI ABDOMEN AND PELVIS WITHOUT AND WITH CONTRAST
TECHNIQUE: Multiplanar multisequence MR imaging of the abdomen and pelvis was
performed both before and after the administration of intravenous
contrast.
CONTRAST:  20mL MULTIHANCE GADOBENATE DIMEGLUMINE 529 MG/ML IV SOLN

[Series 2: T2 · coronal · 5.0mm · 1.25mm/px · 2 of 40 slices shown (1 of 3)]
[im 1/40]
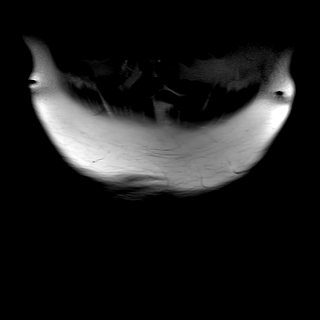
[im 40/40]
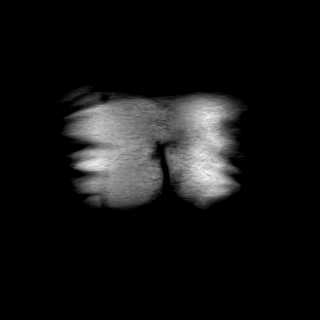

[Series 3: T2 · axial · 5.0mm · 0.41mm/px · z∈[-62,+184]mm · 2 of 42 slices shown (2 of 3)]
[im 1/42]
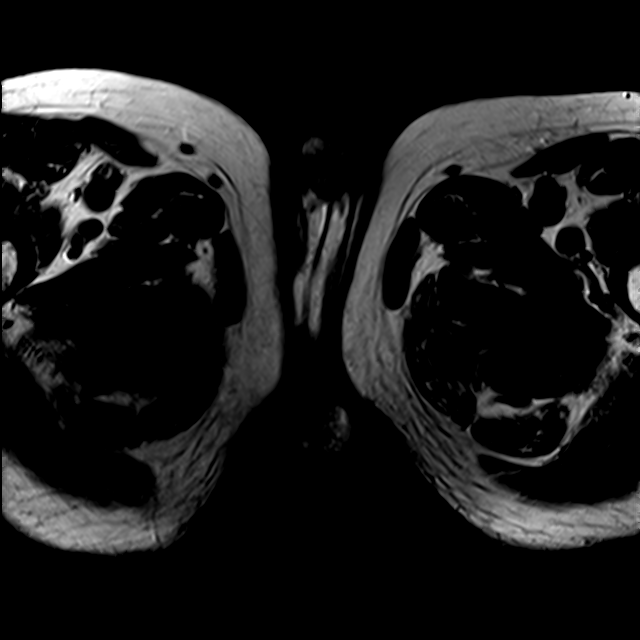
[im 42/42]
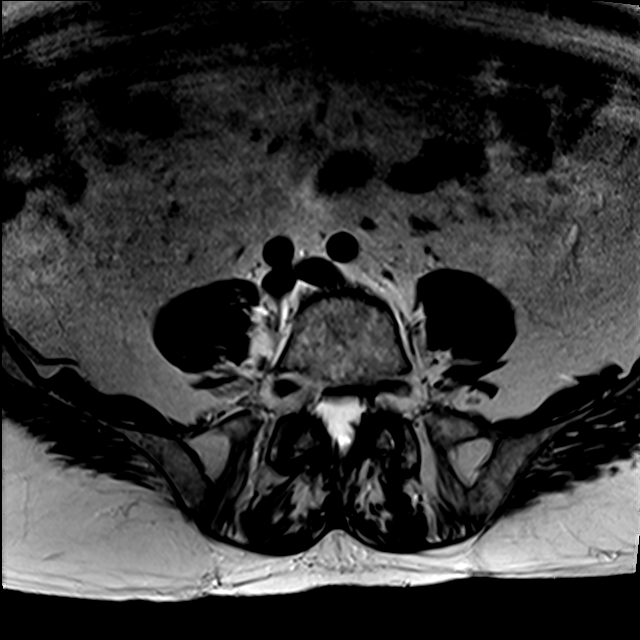

[Series 4: T2 fat-sat · axial · 5.0mm · 0.41mm/px · z∈[-62,+184]mm · 2 of 42 slices shown]
[im 1/42]
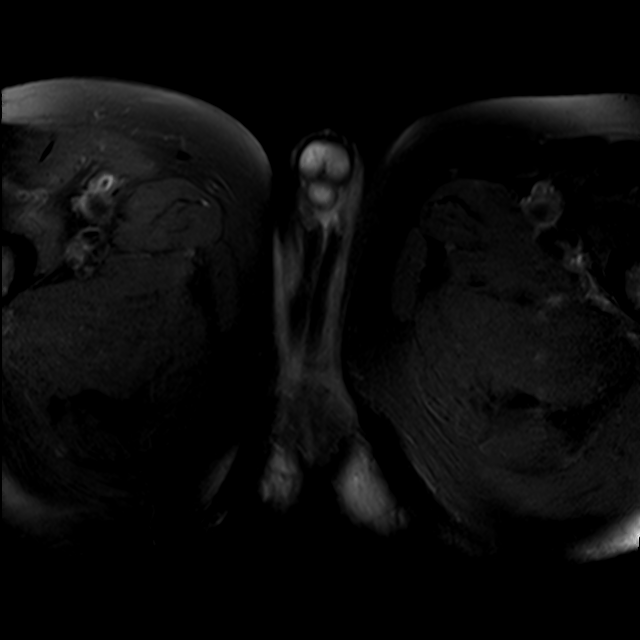
[im 42/42]
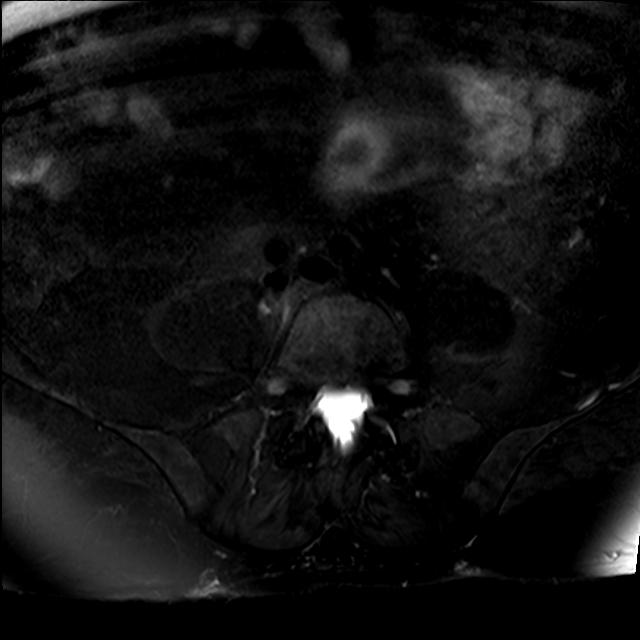

[Series 5: T2 · sagittal · 5.0mm · 0.78mm/px · 2 of 36 slices shown (3 of 3)]
[im 1/36]
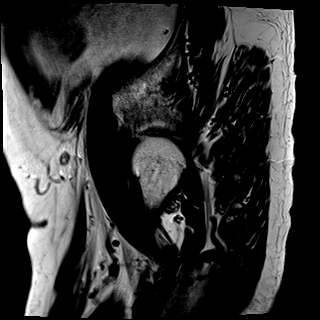
[im 36/36]
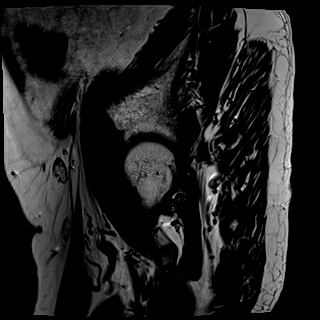

[Series 6: T1 · axial · 1.2mm · 0.75mm/px · z∈[-44,+166]mm · 8 of 176 slices shown]
[im 1/176]
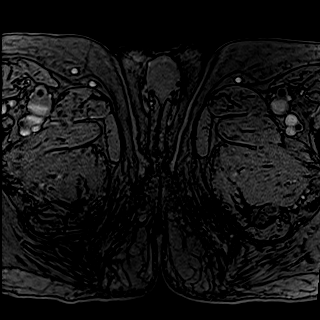
[im 20/176]
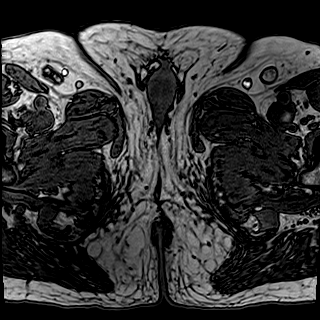
[im 59/176]
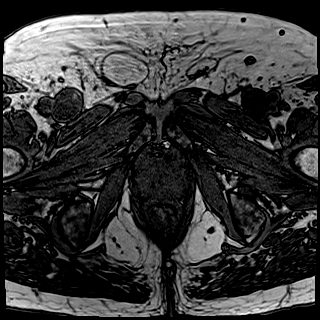
[im 78/176]
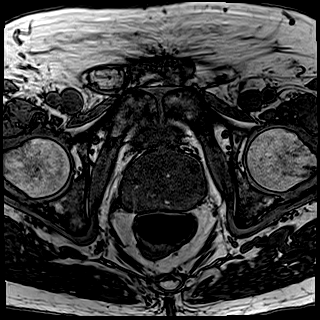
[im 98/176]
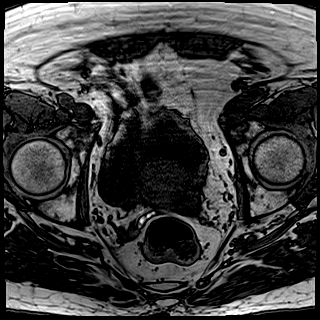
[im 117/176]
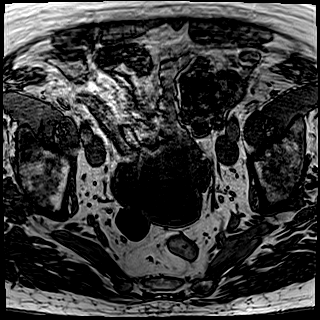
[im 156/176]
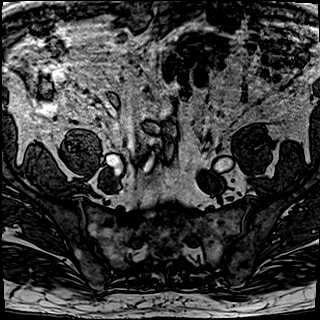
[im 176/176]
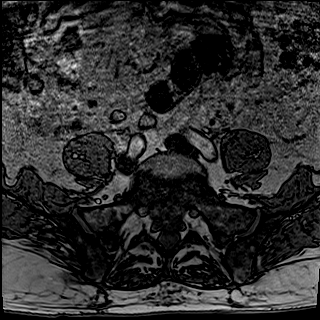

[Series 7: T1 fat-sat · axial · 1.2mm · 0.75mm/px · z∈[-44,+166]mm · 8 of 176 slices shown (1 of 2)]
[im 1/176]
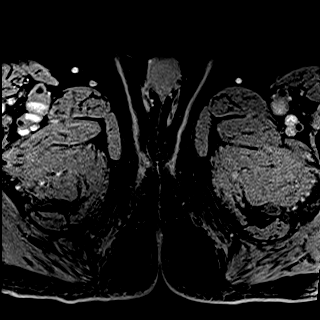
[im 20/176]
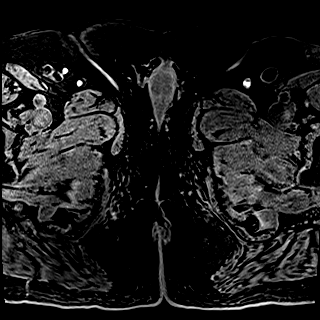
[im 59/176]
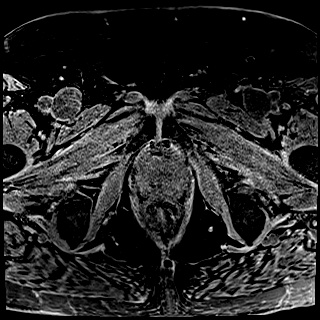
[im 78/176]
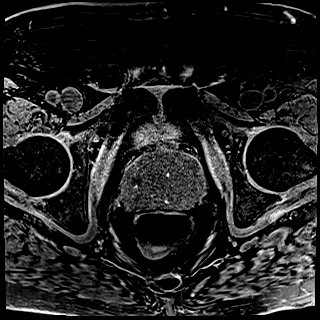
[im 98/176]
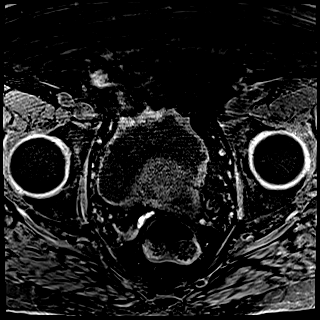
[im 117/176]
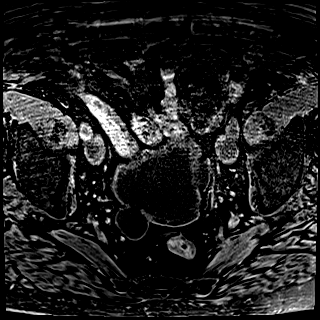
[im 156/176]
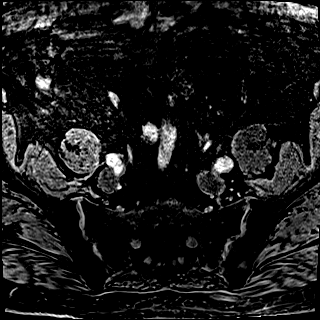
[im 176/176]
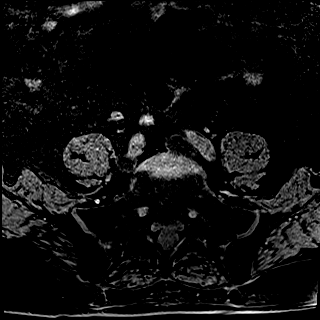

[Series 9: T1 fat-sat post-contrast · axial · 1.2mm · 0.75mm/px · z∈[-113,+97]mm · 8 of 176 slices shown]
[im 1/176]
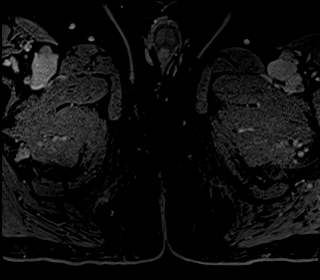
[im 20/176]
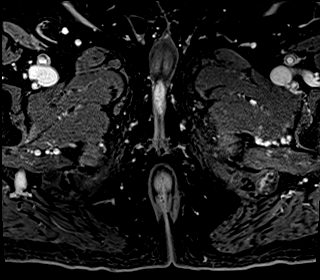
[im 59/176]
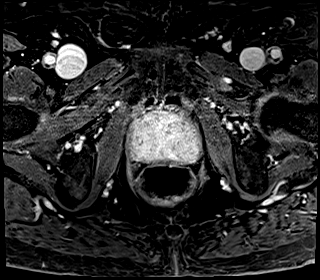
[im 78/176]
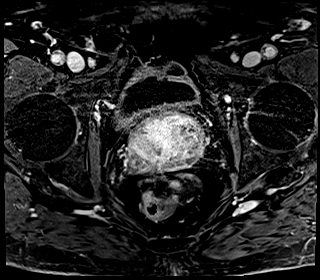
[im 98/176]
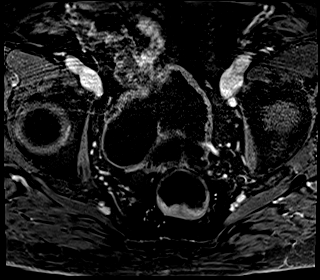
[im 117/176]
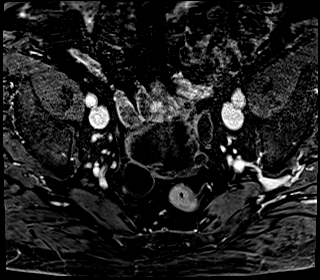
[im 156/176]
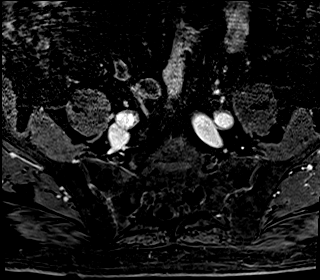
[im 176/176]
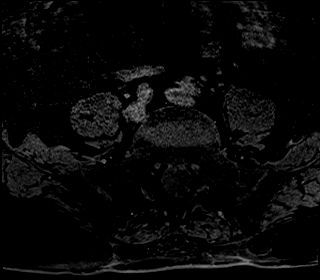

[Series 10: T1 fat-sat · coronal · 1.2mm · 0.75mm/px · 1 of 192 slices shown (2 of 2)]
[im 1/192]
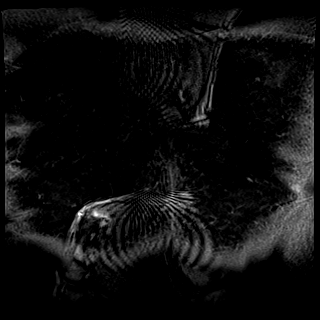

[33 of 48 positions shown; findings below may reference images not displayed]

FINDINGS: COMBINED FINDINGS FOR BOTH MR ABDOMEN AND PELVIS

Lower Chest: No acute findings.

Hepatobiliary: Image degradation by motion artifact noted. Numerous
tiny sub-cm T2 hyperintense lesions are seen throughout the liver,
which are too small to characterize but most likely represent tiny
cysts. A larger lesion is seen in the anterior aspect of segment 4A
which measures 1.6 x 1.5 cm on image 35/15. This shows moderate T2
hypointensity, but dynamic postcontrast sequences are nondiagnostic
due to motion artifact.

Mild diffuse gallbladder wall thickening is seen, however
gallbladder is nondilated, and no evidence of pericholecystic
inflammatory changes or fluid. No definite gallstones noted on this
exam. No evidence of biliary ductal dilatation.

Pancreas:  No mass or inflammatory changes.

Spleen: Within normal limits in size and appearance.

Adrenals/Urinary Tract: Normal adrenal glands. Several simple cysts
are seen in both kidneys, largest measuring approximately 7 cm. A
1.2 cm benign Bosniak category 2 subcapsular cyst is noted in the
upper pole of the right kidney.

A heterogeneously enhancing bilobed mass is seen in the posterior
midpole of the right kidney which measures 5.7 x 4.7 cm (image
33/6). This likely represents a single solid mass rather than 2
adjacent separate masses. This is consistent with renal cell
carcinoma. No evidence of hydronephrosis.

Diffuse bladder wall thickening and trabeculation is seen with
multiple small bladder diverticula, consistent with chronic bladder
outlet obstruction.

Stomach/Bowel: No evidence bowel obstruction, inflammatory process,
or abnormal fluid collections. Diffuse colonic diverticulosis is
noted, however there are no signs of diverticulitis.

Vascular/Lymphatic: No pathologically enlarged lymph nodes. No acute
vascular findings. No evidence of renal vein or IVC thrombus.

Reproductive:  Moderately enlarged prostate.

Other: Small right inguinal hernia is seen, which contains only fat.

Musculoskeletal:  No suspicious bone lesions identified.
IMPRESSION: Image degradation by motion artifact noted.

5.7 cm solid bilobed mass in the midpole of the right kidney,
consistent with renal cell carcinoma.

No evidence of metastatic disease.

Numerous tiny sub-cm T2 hyperintense liver lesions are too small to
characterize, but most likely represent tiny cysts.

1.6 cm indeterminate hypovascular lesion in the anterior left
hepatic lobe. Recommend continued follow-up by CT in 3 months
(rather than MRI due to motion artifact seen on today's exam).

Colonic diverticulosis. No radiographic evidence of diverticulitis.

Moderately enlarged prostate, with findings of chronic bladder
outlet obstruction.

Small right inguinal hernia, which contains only fat.

## 2021-06-13 MED ORDER — GADOBENATE DIMEGLUMINE 529 MG/ML IV SOLN
20.0000 mL | Freq: Once | INTRAVENOUS | Status: AC | PRN
  Administered 2021-06-13: 20 mL via INTRAVENOUS

## 2021-06-23 DIAGNOSIS — E1140 Type 2 diabetes mellitus with diabetic neuropathy, unspecified: Secondary | ICD-10-CM | POA: Diagnosis not present

## 2021-06-23 DIAGNOSIS — I10 Essential (primary) hypertension: Secondary | ICD-10-CM | POA: Diagnosis not present

## 2021-06-23 DIAGNOSIS — Z6828 Body mass index (BMI) 28.0-28.9, adult: Secondary | ICD-10-CM | POA: Diagnosis not present

## 2021-06-23 DIAGNOSIS — Z79899 Other long term (current) drug therapy: Secondary | ICD-10-CM | POA: Diagnosis not present

## 2021-06-23 DIAGNOSIS — D6859 Other primary thrombophilia: Secondary | ICD-10-CM | POA: Diagnosis not present

## 2021-06-23 DIAGNOSIS — I480 Paroxysmal atrial fibrillation: Secondary | ICD-10-CM | POA: Diagnosis not present

## 2021-06-23 DIAGNOSIS — R42 Dizziness and giddiness: Secondary | ICD-10-CM | POA: Diagnosis not present

## 2021-06-23 DIAGNOSIS — D509 Iron deficiency anemia, unspecified: Secondary | ICD-10-CM | POA: Diagnosis not present

## 2021-06-24 DIAGNOSIS — N2889 Other specified disorders of kidney and ureter: Secondary | ICD-10-CM | POA: Diagnosis not present

## 2021-06-24 DIAGNOSIS — C61 Malignant neoplasm of prostate: Secondary | ICD-10-CM | POA: Diagnosis not present

## 2021-06-30 ENCOUNTER — Telehealth: Payer: Self-pay | Admitting: Cardiology

## 2021-06-30 DIAGNOSIS — N2889 Other specified disorders of kidney and ureter: Secondary | ICD-10-CM | POA: Diagnosis not present

## 2021-06-30 NOTE — Telephone Encounter (Signed)
° °  [facility] Pre-operative Risk Assessment    Request for surgical clearance:  What type of surgery is being performed? Right Nephrectomy   When is this surgery scheduled?  07/29/21   What type of clearance is required (medical clearance vs. Pharmacy clearance to hold med vs. Both)?  Both   Are there any medications that need to be held prior to surgery and how long? Eliquis, their office is requesting our advisement regarding hold time    Practice name and name of physician performing surgery?  [facility]    Dr. Audelia Hives  What is your office phone number? [phone]   7.   What is your office fax number? [phone]  8.   Anesthesia type (None, local, MAC, general) ?  Sharee Pimple is unsure   Zara Council 06/30/2021, 10:30 AM

## 2021-06-30 NOTE — Telephone Encounter (Signed)
I s/w the pt son DPR who states he s/w a man earlier today and confirmed the appt. I apologized that I did a note that appt was confirmed. Pt's son said that was ok and he has confirmed the appt while on the phone with me. I will forward notes to MD for upcoming appt. Will send FYI to requesting office the pt has appt 07/05/21.

## 2021-06-30 NOTE — Telephone Encounter (Signed)
° °  Name: David Steele  DOB: 11/21/1925  MRN: [id number]  Primary Cardiologist: Dr. Agustin Cree  Chart reviewed as part of pre-operative protocol coverage. Because of Tiras Bianchini past medical history and time since last visit, he will require a follow-up visit in order to better assess preoperative cardiovascular risk.  At last OV 01/2021, was recommended to follow up in 6 months (due 07/2021). Given advanced age, paroxysmal atrial fib, history of V-tach, orthostatic hypotension, amongst other history as outlined, would suggest pre-op eval in person before clearing. The patient also does not have any recent labs on file since 2021 so our pharmacy team is unable to provide recommendations on Eliquis until these are complete. Recommend provider review and obtain at f/u appointment and review with pharm team at that time.  Pre-op covering staff: - Please schedule appointment and call patient to inform them.  - Please contact requesting surgeon's office via preferred method (i.e, phone, fax) to inform them of need for appointment prior to surgery.  Charlie Pitter, PA-C  06/30/2021, 12:14 PM

## 2021-06-30 NOTE — Telephone Encounter (Signed)
Left a very detailed message that the pt will need an appt for pre op clearance. I have held a time slot of 10:20 on 07/05/21 with Dr. Agustin Cree, however I left detailed message I need a call back by 5 pm today to confirm appt or change appt if needed. If I do not hear back by 5 pm today I will have to cancel the appt on hold.

## 2021-07-05 ENCOUNTER — Encounter: Payer: Self-pay | Admitting: Cardiology

## 2021-07-05 ENCOUNTER — Other Ambulatory Visit: Payer: Self-pay

## 2021-07-05 ENCOUNTER — Ambulatory Visit (INDEPENDENT_AMBULATORY_CARE_PROVIDER_SITE_OTHER): Payer: Medicare Other | Admitting: Cardiology

## 2021-07-05 ENCOUNTER — Ambulatory Visit (INDEPENDENT_AMBULATORY_CARE_PROVIDER_SITE_OTHER): Payer: Medicare Other

## 2021-07-05 VITALS — BP 178/72 | HR 56 | Ht 71.0 in | Wt 212.0 lb

## 2021-07-05 DIAGNOSIS — I951 Orthostatic hypotension: Secondary | ICD-10-CM | POA: Diagnosis not present

## 2021-07-05 DIAGNOSIS — I440 Atrioventricular block, first degree: Secondary | ICD-10-CM

## 2021-07-05 DIAGNOSIS — I480 Paroxysmal atrial fibrillation: Secondary | ICD-10-CM

## 2021-07-05 DIAGNOSIS — Z01810 Encounter for preprocedural cardiovascular examination: Secondary | ICD-10-CM | POA: Diagnosis not present

## 2021-07-05 DIAGNOSIS — N1831 Chronic kidney disease, stage 3a: Secondary | ICD-10-CM

## 2021-07-05 DIAGNOSIS — E782 Mixed hyperlipidemia: Secondary | ICD-10-CM

## 2021-07-05 DIAGNOSIS — R079 Chest pain, unspecified: Secondary | ICD-10-CM

## 2021-07-05 DIAGNOSIS — I10 Essential (primary) hypertension: Secondary | ICD-10-CM

## 2021-07-05 NOTE — Patient Instructions (Signed)
Medication Instructions:  Your physician recommends that you continue on your current medications as directed. Please refer to the Current Medication list given to you today.  *If you need a refill on your cardiac medications before your next appointment, please call your pharmacy*   Lab Work: None If you have labs (blood work) drawn today and your tests are completely normal, you will receive your results only by: [place] (if you have MyChart) OR A paper copy in the mail If you have any lab test that is abnormal or we need to change your treatment, we will call you to review the results.   Testing/Procedures: A zio monitor was ordered today. It will remain on for 7 days. You will then return monitor and event diary in provided box. It takes 1-2 weeks for report to be downloaded and returned to [place]. We will call you with the results. If monitor falls off or has orange flashing light, please call Zio for further instructions.     [facility] [address] [place], [place] [address] Phone:  [phone]    Please arrive 15 minutes prior to your appointment time for registration and insurance purposes.  The test will take approximately 3 to 4 hours to complete; you may bring reading material.  If someone comes with you to your appointment, they will need to remain in the main lobby due to limited space in the testing area. **If you are pregnant or breastfeeding, please notify the nuclear lab prior to your appointment**  How to prepare for your Myocardial Perfusion Test: Do not eat or drink 3 hours prior to your test, except you may have water. Do not consume products containing caffeine (regular or decaffeinated) 12 hours prior to your test. (ex: coffee, chocolate, sodas, tea). Do bring a list of your current medications with you.  If not listed below, you may take your medications as normal. HOLD diabetic medication/insulin the morning of the test:  Metformin, Take half of long acting insulin the night before test Do wear comfortable clothes (no dresses or overalls) and walking shoes, tennis shoes preferred (No heels or open toe shoes are allowed). Do NOT wear cologne, perfume, aftershave, or lotions (deodorant is allowed). If these instructions are not followed, your test will have to be rescheduled.  Please report to [address] for your test.  If you have questions or concerns about your appointment, you can call the [facility] at [phone].  If you cannot keep your appointment, please provide 24 hours notification to the Nuclear Lab, to avoid a possible $50 charge to your account.    Follow-Up: At [facility], you and your health needs are our priority.  As part of our continuing mission to provide you with exceptional heart care, we have created designated Provider Care Teams.  These Care Teams include your primary Cardiologist (physician) and Advanced Practice Providers (APPs -  Physician Assistants and Nurse Practitioners) who all work together to provide you with the care you need, when you need it.  We recommend signing up for the patient portal called "MyChart".  Sign up information is provided on this After Visit Summary.  MyChart is used to connect with patients for Virtual Visits (Telemedicine).  Patients are able to view lab/test results, encounter notes, upcoming appointments, etc.  Non-urgent messages can be sent to your provider as well.   To learn more about what you can do with MyChart, go to [email or web address].  Your next appointment:   5 month(s)  The format for your next appointment:   In Person  Provider:   Jenne Campus, MD    Other Instructions None

## 2021-07-05 NOTE — Progress Notes (Signed)
Cardiology Office Note:    Date:  07/05/2021   ID:  David Steele, DOB 12/26/1925, MRN [id number]  PCP:  David Kiel, MD  Cardiologist:  David Campus, MD    Referring MD: David Kiel, MD   Chief Complaint  Patient presents with   Pre-op Exam    Kidney removed due to cancer    History of Present Illness:    David Steele is a 88 y.o. male  male with past medical history significant for dizziness he did look clearly orthostatic at that time we stopped amlodipine which make him feel much better I also interestingly on the monitor did not put on him and we find episodes of nonsustained ventricular tachycardia it was completely asymptomatic he did have echocardiogram showed preserved left ventricle ejection fraction.  Recently he was discovered to have renal cancer and surgery is contemplated he was sent to [place] for evaluation before the surgery.  He denies have any chest pain tightness squeezing pressure mid chest but he described to have some dizziness dizziness happen in any situation.  He did wear a monitor previously was noted to have nonsustained ventricular tachycardia however no correlation with his symptomatology.  Echocardiogram thereafter showed preserved left ventricle ejection fraction.  He thinks that he is dizziness is related to brain surgery had before he does have cochlear implant.  He never passed out he never fell down because of this.  Past Medical History:  Diagnosis Date   Acute respiratory failure with hypoxia ([place]) 07/03/2019   Advanced age 71/10/2018   Anemia    Anxiety    Aortic atherosclerosis (HCC)    Benign essential HTN 02/23/2017   Bladder cancer ([place])    BPH (benign prostatic hyperplasia)    Cerebral ischemia    CKD (chronic kidney disease), stage III ([place]) 07/03/2019   Current use of long term anticoagulation 04/19/2019   Depressive disorder    Diabetes mellitus    Diabetes mellitus type 2, uncontrolled, with complications 9/48/5462    DOE (dyspnea on exertion) 02/23/2017   Dyslipidemia 07/01/2019   First degree AV block 12/14/2017   Former smoker 06/15/2017   History of bladder cancer 02/23/2017   HLD (hyperlipidemia) 07/03/2019   Hyperlipidemia    Hypertension    Light headed 02/23/2017   Near syncope 02/23/2017   Orthostatic hypotension 08/31/2010   Other fatigue 02/23/2017   Paroxysmal atrial fibrillation (HCC) 02/23/2017   Paroxysmal ventricular tachycardia 05/19/2020   Plantar fasciitis    Pneumonia due to COVID-19 virus 07/01/2019   Profound sensorineural hearing loss ([facility]) 11/13/2017   Formatting of this note might be different from the original. Added automatically from request for surgery [id number]   Prostate cancer ([place]) 07/03/2019   Sinus bradycardia 02/23/2017   T2DM (type 2 diabetes mellitus) ([place]) 07/01/2019    Past Surgical History:  Procedure Laterality Date   BLADDER SURGERY  2007   Cancerous tumor removed   EYE SURGERY     TONSILLECTOMY      Current Medications: Current Meds  Medication Sig   acetaminophen (TYLENOL) 650 MG CR tablet Take 650 mg by mouth daily.   apixaban (ELIQUIS) 5 MG TABS tablet Take 5 mg by mouth 2 (two) times daily.   Brinzolamide-Brimonidine (SIMBRINZA) 1-0.2 % SUSP Apply 1 drop to eye in the morning, at noon, and at bedtime.   ferrous sulfate 325 (65 FE) MG tablet Take 325 mg by mouth daily with breakfast.   finasteride (PROSCAR) 5 MG tablet Take 5 mg by mouth daily.  insulin NPH-regular Human (70-30) 100 UNIT/ML injection Inject 30 Units into the skin 2 (two) times daily with a meal. 30 units before meal in AM and 25 units in evening before meal Subcutaneous Patient uses vials   lansoprazole (PREVACID) 30 MG capsule Take 30 mg by mouth daily.   lisinopril (ZESTRIL) 20 MG tablet TAKE 1 TABLET(20 MG) BY MOUTH DAILY   metFORMIN (GLUCOPHAGE) 1000 MG tablet Take 1,000 mg by mouth 2 (two) times daily with a meal.    mirabegron ER (MYRBETRIQ) 50 MG TB24 tablet Take 50 mg by mouth  at bedtime.   Multiple Vitamins-Minerals (CENTRUM SILVER ADULT 50+ PO) Take 1 tablet by mouth daily. Unknown strength   PARoxetine (PAXIL) 30 MG tablet Take 30 mg by mouth daily.     Allergies:   Simvastatin   Social History   Socioeconomic History   Marital status: Single    Spouse name: Not on file   Number of children: Not on file   Years of education: Not on file   Highest education level: Not on file  Occupational History   Occupation: reired  Tobacco Use   Smoking status: Former    Types: Cigarettes    Quit date: 05/16/1988    Years since quitting: 33.1   Smokeless tobacco: Never  Vaping Use   Vaping Use: Former  Substance and Sexual Activity   Alcohol use: No   Drug use: No   Sexual activity: Not Currently  Other Topics Concern   Not on file  Social History Narrative   Not on file   Social Determinants of Health   Financial Resource Strain: Not on file  Food Insecurity: Not on file  Transportation Needs: Not on file  Physical Activity: Not on file  Stress: Not on file  Social Connections: Not on file     Family History: The patient's family history is not on file. ROS:   Please see the history of present illness.    All 14 point review of systems negative except as described per history of present illness  EKGs/Labs/Other Studies Reviewed:      Recent Labs: No results found for requested labs within last 8760 hours.  Recent Lipid Panel    Component Value Date/Time   CHOL 138 07/04/2019 0210   TRIG 35 07/04/2019 0210   HDL 36 (L) 07/04/2019 0210   CHOLHDL 3.8 07/04/2019 0210   VLDL 7 07/04/2019 0210   LDLCALC 95 07/04/2019 0210    Physical Exam:    VS:  BP (!) 178/72 (BP Location: Left Arm, Patient Position: Sitting, Cuff Size: Normal)    Pulse (!) 56    Ht 5\' 11"  (1.803 m)    Wt 212 lb (96.2 kg)    SpO2 94%    BMI 29.57 kg/m     Wt Readings from Last 3 Encounters:  07/05/21 212 lb (96.2 kg)  02/10/21 215 lb 9.6 oz (97.8 kg)  09/10/20 218  lb (98.9 kg)     GEN:  Well nourished, well developed in no acute distress HEENT: Normal NECK: No JVD; No carotid bruits LYMPHATICS: No lymphadenopathy CARDIAC: RRR, no murmurs, no rubs, no gallops RESPIRATORY:  Clear to auscultation without rales, wheezing or rhonchi  ABDOMEN: Soft, non-tender, non-distended MUSCULOSKELETAL:  No edema; No deformity  SKIN: Warm and dry LOWER EXTREMITIES: no swelling NEUROLOGIC:  Alert and oriented x 3 PSYCHIATRIC:  Normal affect   ASSESSMENT:    1. Orthostatic hypotension   2. Benign essential HTN  3. First degree AV block   4. Paroxysmal atrial fibrillation (HCC)   5. Stage 3a chronic kidney disease ([place])   6. Mixed hyperlipidemia   7. Preop cardiovascular exam    PLAN:    In order of problems listed above:  Cardiovascular preop evaluation.  I will schedule him to have a Lexiscan to make sure he does not have any inducible ischemia.  His ability to exercise very limited in surgery is significant. Benign essential hypertension blood pressure elevated like always in the office.  We will continue monitoring.  He tells me at home is always good. First-degree AV block noted.  With his history of dizziness and resting bradycardia I will schedule him to have Zio patch for 1 week to make sure he does not have any inducible ischemia. States she chronic kidney disease.  Noted. Preop cardiovascular evaluation again he required stress testing as well as Zio patch before surgery.   Medication Adjustments/Labs and Tests Ordered: Current medicines are reviewed at length with the patient today.  Concerns regarding medicines are outlined above.  No orders of the defined types were placed in this encounter.  Medication changes: No orders of the defined types were placed in this encounter.   Signed, Park Liter, MD, [facility] Steele 07/05/2021 11:18 AM    [place]

## 2021-07-09 ENCOUNTER — Telehealth (HOSPITAL_COMMUNITY): Payer: Self-pay | Admitting: *Deleted

## 2021-07-09 NOTE — Telephone Encounter (Signed)
Left message on voicemail per DPR in reference to upcoming appointment scheduled on 07/15/2021 at 11:30 with detailed instructions given per Myocardial Perfusion Study Information Sheet for the test. LM to arrive 15 minutes early, and that it is imperative to arrive on time for appointment to keep from having the test rescheduled. If you need to cancel or reschedule your appointment, please call the office within 24 hours of your appointment. Failure to do so may result in a cancellation of your appointment, and a $50 no show fee. Phone number given for call back for any questions.

## 2021-07-12 DIAGNOSIS — H903 Sensorineural hearing loss, bilateral: Secondary | ICD-10-CM | POA: Diagnosis not present

## 2021-07-12 DIAGNOSIS — Z9621 Cochlear implant status: Secondary | ICD-10-CM | POA: Diagnosis not present

## 2021-07-13 DIAGNOSIS — Z01810 Encounter for preprocedural cardiovascular examination: Secondary | ICD-10-CM | POA: Diagnosis not present

## 2021-07-13 DIAGNOSIS — K5730 Diverticulosis of large intestine without perforation or abscess without bleeding: Secondary | ICD-10-CM | POA: Diagnosis not present

## 2021-07-13 DIAGNOSIS — I700 Atherosclerosis of aorta: Secondary | ICD-10-CM | POA: Diagnosis not present

## 2021-07-13 DIAGNOSIS — N2889 Other specified disorders of kidney and ureter: Secondary | ICD-10-CM | POA: Diagnosis not present

## 2021-07-13 DIAGNOSIS — N320 Bladder-neck obstruction: Secondary | ICD-10-CM | POA: Diagnosis not present

## 2021-07-13 DIAGNOSIS — D090 Carcinoma in situ of bladder: Secondary | ICD-10-CM | POA: Diagnosis not present

## 2021-07-13 DIAGNOSIS — C679 Malignant neoplasm of bladder, unspecified: Secondary | ICD-10-CM | POA: Diagnosis not present

## 2021-07-13 DIAGNOSIS — N281 Cyst of kidney, acquired: Secondary | ICD-10-CM | POA: Diagnosis not present

## 2021-07-13 DIAGNOSIS — Z01818 Encounter for other preprocedural examination: Secondary | ICD-10-CM | POA: Diagnosis not present

## 2021-07-13 DIAGNOSIS — R1900 Intra-abdominal and pelvic swelling, mass and lump, unspecified site: Secondary | ICD-10-CM | POA: Diagnosis not present

## 2021-07-15 ENCOUNTER — Other Ambulatory Visit: Payer: Self-pay

## 2021-07-15 ENCOUNTER — Ambulatory Visit (INDEPENDENT_AMBULATORY_CARE_PROVIDER_SITE_OTHER): Payer: Medicare Other

## 2021-07-15 DIAGNOSIS — R079 Chest pain, unspecified: Secondary | ICD-10-CM

## 2021-07-15 MED ORDER — TECHNETIUM TC 99M TETROFOSMIN IV KIT
31.1000 | PACK | Freq: Once | INTRAVENOUS | Status: AC | PRN
  Administered 2021-07-15: 31.1 via INTRAVENOUS

## 2021-07-15 MED ORDER — TECHNETIUM TC 99M TETROFOSMIN IV KIT
10.9000 | PACK | Freq: Once | INTRAVENOUS | Status: AC | PRN
Start: 2021-07-15 — End: 2021-07-15
  Administered 2021-07-15: 10.9 via INTRAVENOUS

## 2021-07-15 MED ORDER — REGADENOSON 0.4 MG/5ML IV SOLN
0.4000 mg | Freq: Once | INTRAVENOUS | Status: AC
  Administered 2021-07-15: 0.4 mg via INTRAVENOUS

## 2021-07-16 LAB — MYOCARDIAL PERFUSION IMAGING
LV dias vol: 135 mL (ref 62–150)
LV sys vol: 43 mL
Nuc Stress EF: 68 %
Peak HR: 83 {beats}/min
Rest HR: 64 {beats}/min
Rest Nuclear Isotope Dose: 10.9 mCi
SDS: 2
SRS: 5
SSS: 7
ST Depression (mm): 0 mm
Stress Nuclear Isotope Dose: 31.1 mCi
TID: 1.03

## 2021-07-19 DIAGNOSIS — I10 Essential (primary) hypertension: Secondary | ICD-10-CM | POA: Diagnosis not present

## 2021-07-19 DIAGNOSIS — I480 Paroxysmal atrial fibrillation: Secondary | ICD-10-CM | POA: Diagnosis not present

## 2021-07-19 DIAGNOSIS — I951 Orthostatic hypotension: Secondary | ICD-10-CM | POA: Diagnosis not present

## 2021-07-19 DIAGNOSIS — I440 Atrioventricular block, first degree: Secondary | ICD-10-CM | POA: Diagnosis not present

## 2021-07-21 ENCOUNTER — Telehealth: Payer: Self-pay | Admitting: Cardiology

## 2021-07-21 NOTE — Telephone Encounter (Signed)
Patient's daughter in law called, and said patient can be reached at [phone] ?

## 2021-07-21 NOTE — Telephone Encounter (Signed)
Results reviewed with pt as per Dr. Krasowski's note.  Pt verbalized understanding and had no additional questions. Routed to PCP  

## 2021-07-21 NOTE — Telephone Encounter (Signed)
Left the pt a message to call the office back. 

## 2021-07-21 NOTE — Telephone Encounter (Signed)
Pt returning phone call... please advise  

## 2021-07-28 ENCOUNTER — Telehealth: Payer: Self-pay

## 2021-07-28 NOTE — Telephone Encounter (Signed)
David Steele from [place] returned my call . She stated that the patient was scheduled for a nephrectomy tomorrow and she was calling to get preop clearance. I explained to her that we need to get a fax from the office explaining the different parameters for the surgery so we can send the information preop clearance team. She stated that she would fax the information over so we could send it to the preop clearance team. ?

## 2021-07-28 NOTE — Telephone Encounter (Signed)
David Steele from [facility] was returning David Steele's Call. The [facility] Center will be faxing over the Urologist's last OV note for Dr. Agustin Cree to clear the patient for surgery  ? ? ?

## 2021-07-28 NOTE — Telephone Encounter (Signed)
David Steele from [place] returned my call . She stated that the patient was scheduled for a nephrectomy tomorrow and she was calling to get preop clearance. I explained to her that we need to get a fax from the office explaining the different parameters for the surgery so we can send the information preop clearance team. She stated that she would fax the information over so we could send it to the preop clearance team ?

## 2021-07-28 NOTE — Telephone Encounter (Signed)
Received a message from [place] at [facility] that she would like for me to call her from [phone] about this patient. Called the number back and she did not answer, left message for Sharee Pimple to return my call. ?

## 2021-07-29 DIAGNOSIS — Z8546 Personal history of malignant neoplasm of prostate: Secondary | ICD-10-CM | POA: Diagnosis not present

## 2021-07-29 DIAGNOSIS — C641 Malignant neoplasm of right kidney, except renal pelvis: Secondary | ICD-10-CM | POA: Diagnosis not present

## 2021-07-29 DIAGNOSIS — N2889 Other specified disorders of kidney and ureter: Secondary | ICD-10-CM | POA: Diagnosis not present

## 2021-07-29 DIAGNOSIS — E7800 Pure hypercholesterolemia, unspecified: Secondary | ICD-10-CM | POA: Diagnosis not present

## 2021-07-29 DIAGNOSIS — I10 Essential (primary) hypertension: Secondary | ICD-10-CM | POA: Diagnosis not present

## 2021-07-29 DIAGNOSIS — Z8551 Personal history of malignant neoplasm of bladder: Secondary | ICD-10-CM | POA: Diagnosis not present

## 2021-07-29 DIAGNOSIS — N320 Bladder-neck obstruction: Secondary | ICD-10-CM | POA: Diagnosis not present

## 2021-07-29 DIAGNOSIS — E119 Type 2 diabetes mellitus without complications: Secondary | ICD-10-CM | POA: Diagnosis not present

## 2021-07-29 DIAGNOSIS — Z87891 Personal history of nicotine dependence: Secondary | ICD-10-CM | POA: Diagnosis not present

## 2021-08-03 ENCOUNTER — Telehealth: Payer: Self-pay

## 2021-08-03 DIAGNOSIS — H903 Sensorineural hearing loss, bilateral: Secondary | ICD-10-CM | POA: Diagnosis not present

## 2021-08-03 NOTE — Telephone Encounter (Signed)
Dr. Agustin Cree ?Pt underwent nuclear stress test and heart monitor that showed several runs of NSVT. Do you recommend he proceed with nephrectomy? ?

## 2021-08-03 NOTE — Telephone Encounter (Signed)
? ?  [facility] Pre-operative Risk Assessment  ?  ?Request for surgical clearance: ? ?What type of surgery is being performed? Radical Nephrectomy  ? ?When is this surgery scheduled? TBD  ? ?What type of clearance is required (medical clearance vs. Pharmacy clearance to hold med vs. Both)? Both ? ?Are there any medications that need to be held prior to surgery and how long?Eliquis (holding length not specified)  ? ?Practice name and name of physician performing surgery? Dr. Oscar La at [facility]  ? ?What is your office phone number: [phone]  ?  ?7.   What is your office fax number: [phone] ? ?8.   Anesthesia type (None, local, MAC, general) ? Not specified  ? ? ?David Steele ?08/03/2021, 12:55 PM  ?_________________________________________________________________ ?  (provider comments below) ? ?

## 2021-08-06 ENCOUNTER — Other Ambulatory Visit: Payer: Self-pay | Admitting: Family

## 2021-08-06 DIAGNOSIS — D509 Iron deficiency anemia, unspecified: Secondary | ICD-10-CM | POA: Diagnosis not present

## 2021-08-06 DIAGNOSIS — Z6827 Body mass index (BMI) 27.0-27.9, adult: Secondary | ICD-10-CM | POA: Diagnosis not present

## 2021-08-06 DIAGNOSIS — R55 Syncope and collapse: Secondary | ICD-10-CM | POA: Diagnosis not present

## 2021-08-06 DIAGNOSIS — R42 Dizziness and giddiness: Secondary | ICD-10-CM | POA: Diagnosis not present

## 2021-08-09 ENCOUNTER — Other Ambulatory Visit: Payer: Self-pay | Admitting: Family

## 2021-08-09 DIAGNOSIS — R42 Dizziness and giddiness: Secondary | ICD-10-CM

## 2021-08-09 DIAGNOSIS — R55 Syncope and collapse: Secondary | ICD-10-CM

## 2021-08-09 NOTE — Telephone Encounter (Signed)
? ?  Patient Name: David Steele  ?DOB: 1926-02-25 ?MRN: [id number] ? ?Primary Cardiologist: Dr. Agustin Cree ? ?Chart reviewed as part of pre-operative protocol coverage. Clearance was entered into our pool 08/03/21, however, upon review of chart, it appears he already had his right radial nephrectomy performed on 07/29/21. Therefore will remove this future clearance request from preop box. Will route this update to requesting provider via Epic fax function. Please call with questions. ? ? ?Charlie Pitter, PA-C ?08/09/2021, 10:33 AM ? ? ?

## 2021-08-16 DIAGNOSIS — C679 Malignant neoplasm of bladder, unspecified: Secondary | ICD-10-CM | POA: Diagnosis not present

## 2021-08-16 DIAGNOSIS — C61 Malignant neoplasm of prostate: Secondary | ICD-10-CM | POA: Diagnosis not present

## 2021-08-19 DIAGNOSIS — D72828 Other elevated white blood cell count: Secondary | ICD-10-CM | POA: Diagnosis not present

## 2021-08-19 DIAGNOSIS — R7989 Other specified abnormal findings of blood chemistry: Secondary | ICD-10-CM | POA: Diagnosis not present

## 2021-08-23 DIAGNOSIS — F419 Anxiety disorder, unspecified: Secondary | ICD-10-CM | POA: Diagnosis not present

## 2021-08-28 ENCOUNTER — Other Ambulatory Visit: Payer: Medicare Other

## 2021-09-01 ENCOUNTER — Ambulatory Visit
Admission: RE | Admit: 2021-09-01 | Discharge: 2021-09-01 | Disposition: A | Discharge status: Home / Self Care | Payer: Medicare Other | Source: Ambulatory Visit | Attending: Family | Admitting: Family

## 2021-09-01 DIAGNOSIS — R42 Dizziness and giddiness: Secondary | ICD-10-CM | POA: Diagnosis not present

## 2021-09-01 DIAGNOSIS — R55 Syncope and collapse: Secondary | ICD-10-CM | POA: Diagnosis not present

## 2021-09-01 DIAGNOSIS — I6782 Cerebral ischemia: Secondary | ICD-10-CM | POA: Diagnosis not present

## 2021-09-01 DIAGNOSIS — G319 Degenerative disease of nervous system, unspecified: Secondary | ICD-10-CM | POA: Diagnosis not present

## 2021-09-01 IMAGING — MR MR HEAD WO/W CM
12 series · 48 of 48 positions shown · IV contrast (multihance)
Comparison: Head CT [DATE].  Head MRI [DATE].

CLINICAL DATA: Syncope and collapse.  Dizziness.  Visual floaters.

EXAM:
MRI HEAD WITHOUT AND WITH CONTRAST
TECHNIQUE: Multiplanar, multiecho pulse sequences of the brain and surrounding
structures were obtained without and with intravenous contrast.
CONTRAST:  19mL MULTIHANCE GADOBENATE DIMEGLUMINE 529 MG/ML IV SOLN

[Series 2: T1 · sagittal · 5.0mm · 0.45mm/px · 1 of 25 slices shown]
[im 1/25]
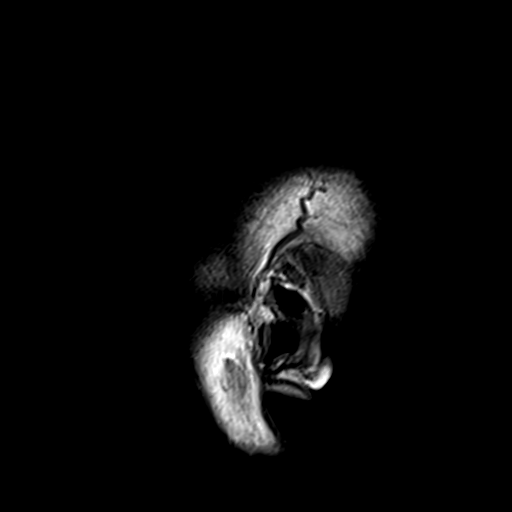

[Series 3: T2 · axial · 5.0mm · 0.65mm/px · z∈[-19,+148]mm · 2 of 29 slices shown (1 of 2)]
[im 1/29]
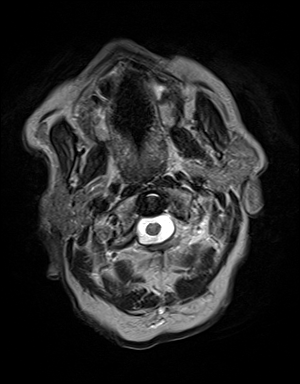
[im 29/29]
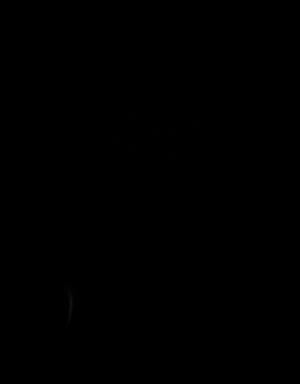

[Series 4: ax ep2d_diff_3 · axial · 3.0mm · 1.80mm/px · z∈[-17,+144]mm · 5 of 82 slices shown]
[im 1/82]
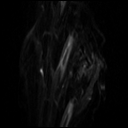
[im 21/82]
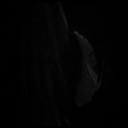
[im 41/82]
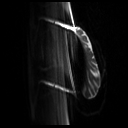
[im 61/82]
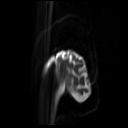
[im 82/82]
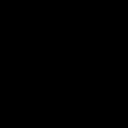

[Series 5: ax ep2d_diff_3_adc · axial · 3.0mm · 1.80mm/px · z∈[-17,+144]mm · 4 of 55 slices shown]
[im 1/55]
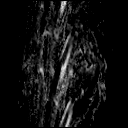
[im 19/55]
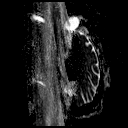
[im 37/55]
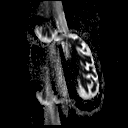
[im 55/55]
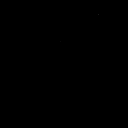

[Series 6: t1_mpr_tra · axial · 1.0mm · 0.72mm/px · z∈[-16,+142]mm · 10 of 160 slices shown]
[im 1/160]
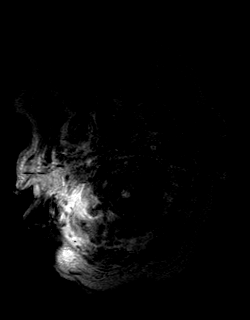
[im 18/160]
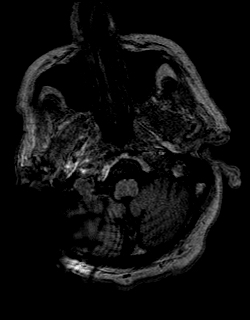
[im 36/160]
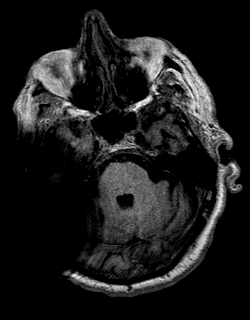
[im 54/160]
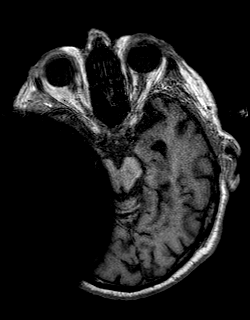
[im 71/160]
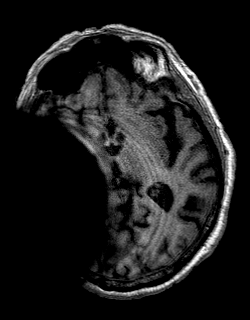
[im 89/160]
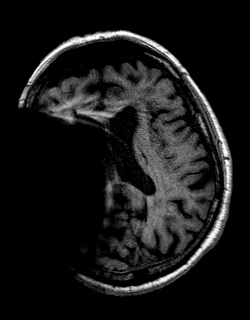
[im 107/160]
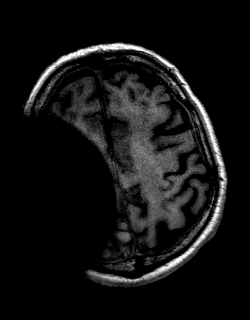
[im 124/160]
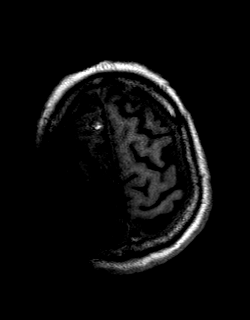
[im 142/160]
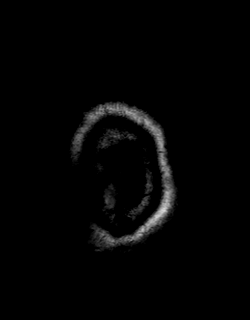
[im 160/160]
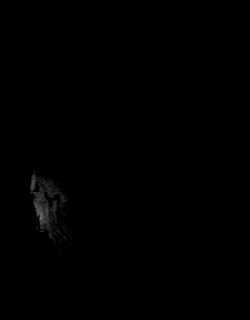

[Series 7: cor ep2d_diff · coronal · 5.0mm · 1.77mm/px · 2 of 38 slices shown]
[im 1/38]
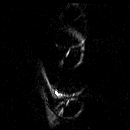
[im 38/38]
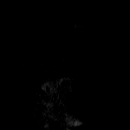

[Series 8: cor ep2d_diff_adc · coronal · 5.0mm · 1.77mm/px · 2 of 30 slices shown]
[im 1/30]
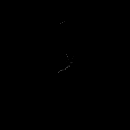
[im 30/30]
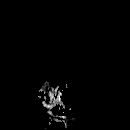

[Series 10: swi_images · axial · 2.0mm · 0.98mm/px · z∈[-14,+143]mm · 5 of 80 slices shown]
[im 1/80]
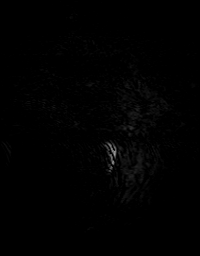
[im 20/80]
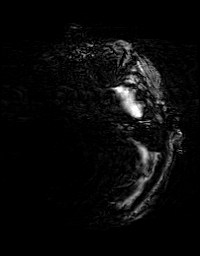
[im 40/80]
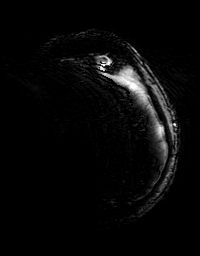
[im 60/80]
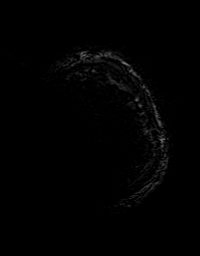
[im 80/80]
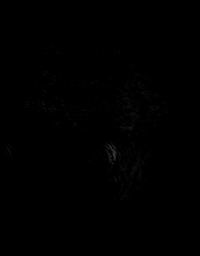

[Series 11: FLAIR · axial · 3.0mm · 0.43mm/px · z∈[-13,+138]mm · 3 of 40 slices shown]
[im 1/40]
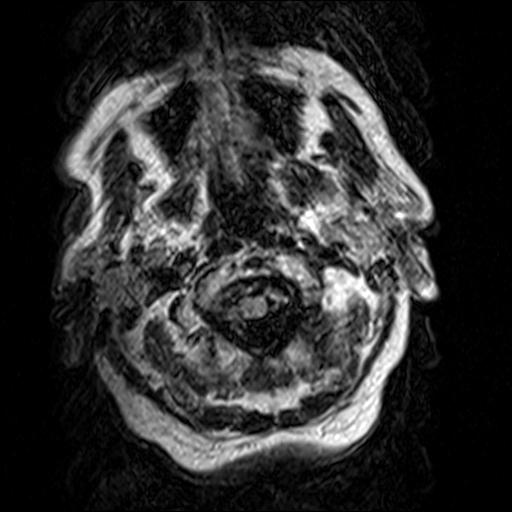
[im 20/40]
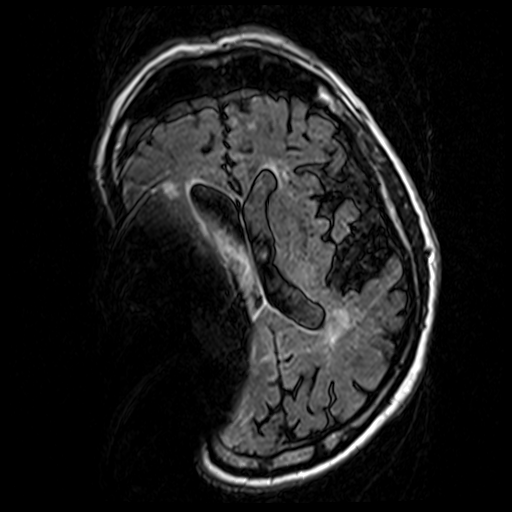
[im 40/40]
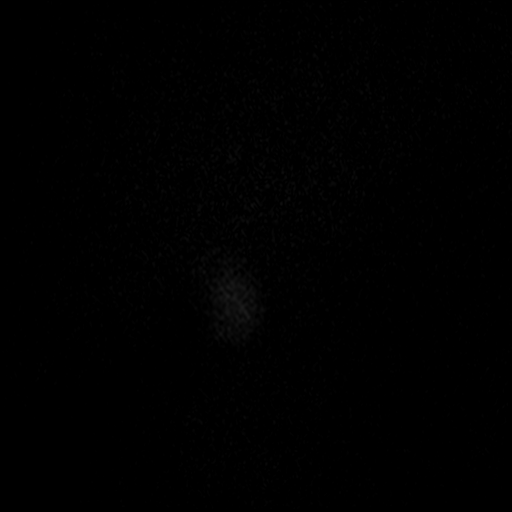

[Series 12: T2 · coronal · 5.0mm · 0.43mm/px · 2 of 28 slices shown (2 of 2)]
[im 1/28]
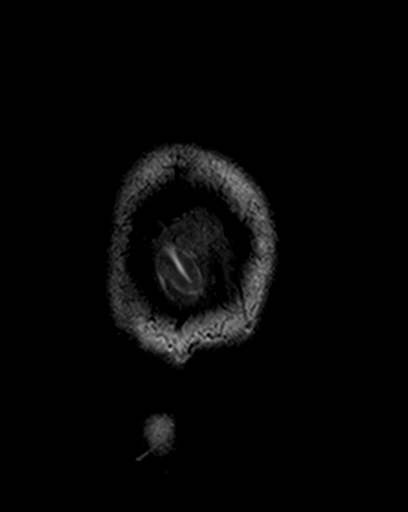
[im 28/28]
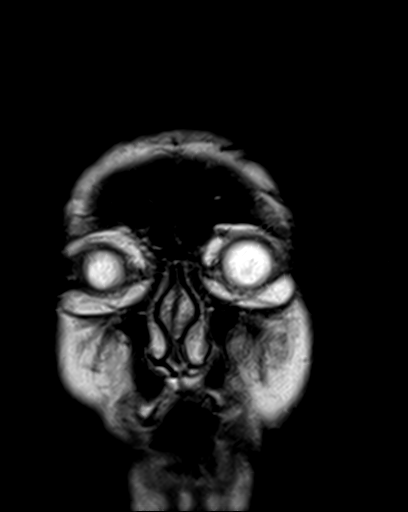

[Series 13: post t1_mpr_tra · axial · 1.0mm · 0.72mm/px · z∈[-16,+142]mm · 10 of 160 slices shown]
[im 1/160]
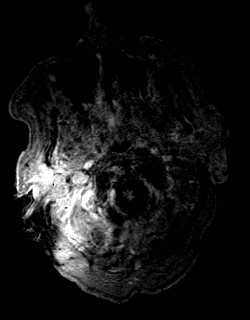
[im 18/160]
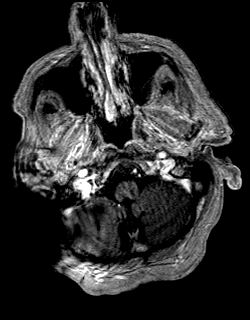
[im 36/160]
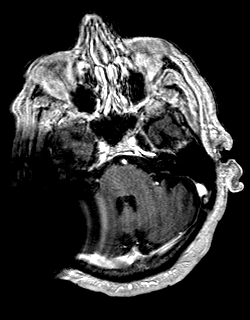
[im 54/160]
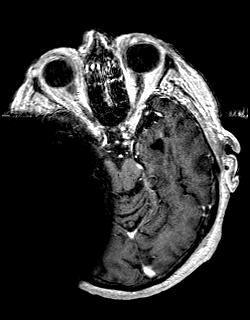
[im 71/160]
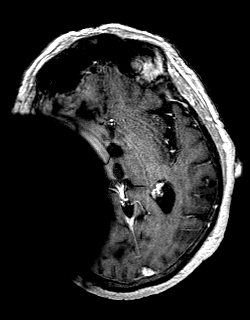
[im 89/160]
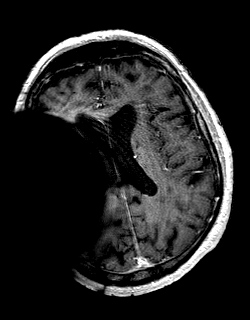
[im 107/160]
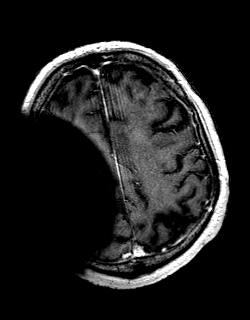
[im 124/160]
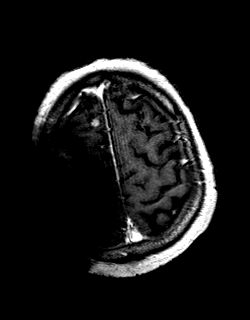
[im 142/160]
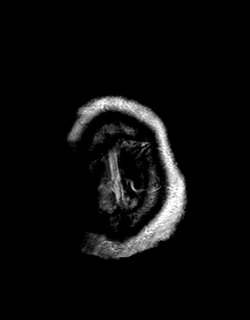
[im 160/160]
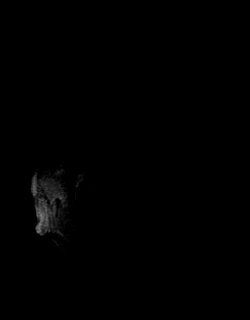

[Series 14: T1 post-contrast · coronal · 5.0mm · 0.72mm/px · 2 of 26 slices shown]
[im 1/26]
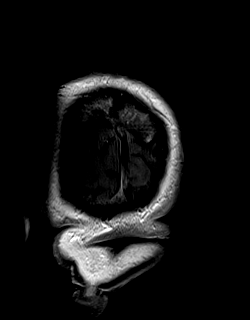
[im 26/26]
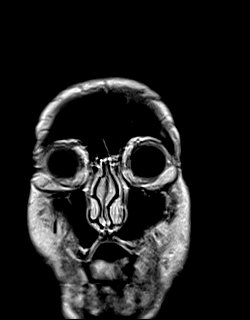

[48 of 48 positions shown; findings below may reference images not displayed]

FINDINGS: Brain: There is extensive magnetic susceptibility artifact from the
patient's right-sided cochlear implant. Diffusion-weighted and
susceptibility weighted images are completely nondiagnostic,
limiting assessment for recent infarct and hemorrhage. Substantial
portions of the right cerebral hemisphere and posterior fossa are
also obscured on other sequences. No significant intracranial mass
effect or extra-axial fluid collection is identified. A 1 cm
cavernoma in the right superior frontal gyrus is unchanged, and
there is no associated edema. T2 hyperintensities in the cerebral
white matter bilaterally have mildly progressed from the prior MRI
and are nonspecific but compatible with mild-to-moderate chronic
small vessel ischemic disease. There is mild-to-moderate cerebral
atrophy. No suspicious intracranial enhancement is identified.

Vascular: Major intracranial vascular flow voids are grossly
preserved where adequately visualized.

Skull and upper cervical spine: No suspicious marrow lesion.

Sinuses/Orbits: Bilateral cataract extraction and right scleral
buckle. Small mucous retention cyst in the left maxillary sinus.
Mild mucosal thickening in the ethmoid sinuses. Prior right-sided
mastoidectomy and cochlear implant. Clear left mastoid air cells.

Other: None.
IMPRESSION: 1. Extensive artifact from the cochlear implant. No gross acute
intracranial abnormality identified.
2. Mild-to-moderate chronic small vessel ischemic disease and
cerebral atrophy.

## 2021-09-01 MED ORDER — GADOBENATE DIMEGLUMINE 529 MG/ML IV SOLN
19.0000 mL | Freq: Once | INTRAVENOUS | Status: AC | PRN
  Administered 2021-09-01: 19 mL via INTRAVENOUS

## 2021-09-02 DIAGNOSIS — D509 Iron deficiency anemia, unspecified: Secondary | ICD-10-CM | POA: Diagnosis not present

## 2021-09-02 DIAGNOSIS — H401131 Primary open-angle glaucoma, bilateral, mild stage: Secondary | ICD-10-CM | POA: Diagnosis not present

## 2021-09-20 DIAGNOSIS — F419 Anxiety disorder, unspecified: Secondary | ICD-10-CM | POA: Diagnosis not present

## 2021-09-20 DIAGNOSIS — E1140 Type 2 diabetes mellitus with diabetic neuropathy, unspecified: Secondary | ICD-10-CM | POA: Diagnosis not present

## 2021-10-18 DIAGNOSIS — E1140 Type 2 diabetes mellitus with diabetic neuropathy, unspecified: Secondary | ICD-10-CM | POA: Diagnosis not present

## 2021-10-18 DIAGNOSIS — Z79899 Other long term (current) drug therapy: Secondary | ICD-10-CM | POA: Diagnosis not present

## 2021-10-18 DIAGNOSIS — R110 Nausea: Secondary | ICD-10-CM | POA: Diagnosis not present

## 2021-10-18 DIAGNOSIS — G4700 Insomnia, unspecified: Secondary | ICD-10-CM | POA: Diagnosis not present

## 2021-10-18 DIAGNOSIS — D509 Iron deficiency anemia, unspecified: Secondary | ICD-10-CM | POA: Diagnosis not present

## 2021-10-18 DIAGNOSIS — R42 Dizziness and giddiness: Secondary | ICD-10-CM | POA: Diagnosis not present

## 2021-10-18 DIAGNOSIS — D6859 Other primary thrombophilia: Secondary | ICD-10-CM | POA: Diagnosis not present

## 2021-10-18 DIAGNOSIS — I480 Paroxysmal atrial fibrillation: Secondary | ICD-10-CM | POA: Diagnosis not present

## 2021-10-18 DIAGNOSIS — Z6825 Body mass index (BMI) 25.0-25.9, adult: Secondary | ICD-10-CM | POA: Diagnosis not present

## 2021-11-02 DIAGNOSIS — R42 Dizziness and giddiness: Secondary | ICD-10-CM | POA: Diagnosis not present

## 2021-11-04 ENCOUNTER — Telehealth: Payer: Self-pay | Admitting: Cardiology

## 2021-11-04 NOTE — Telephone Encounter (Signed)
Son called in to say that when he took the patient to his therapy his bp was exteremly high. His bp being so high it cause him to get dizzy. They stated that he needs to see his heart dr. Pandora Leiter calling in to see what can be done. Please advise

## 2021-11-04 NOTE — Telephone Encounter (Signed)
Spoke with pts son per DPR. Pt was at therapy prescribed by PCP and his BP went up. They took his blood pressure readings and said it was fluctuating. They advised he be seen. Encouraged son to see if PCP had an opening today or tomorrow to have him checked and call the DOD if needed. Will add him to cancellation list for Dr. Agustin Cree. Son agreed and verbalized understanding.

## 2021-11-10 ENCOUNTER — Inpatient Hospital Stay (HOSPITAL_COMMUNITY)
Admission: EM | Admit: 2021-11-10 | Discharge: 2021-11-17 | DRG: 377 | Disposition: A | Discharge status: Home Health | Payer: Medicare Other | Source: Other Acute Inpatient Hospital | Attending: Internal Medicine | Admitting: Internal Medicine

## 2021-11-10 ENCOUNTER — Other Ambulatory Visit: Payer: Self-pay

## 2021-11-10 ENCOUNTER — Emergency Department (HOSPITAL_COMMUNITY): Payer: Medicare Other

## 2021-11-10 ENCOUNTER — Encounter (HOSPITAL_COMMUNITY): Payer: Self-pay

## 2021-11-10 ENCOUNTER — Inpatient Hospital Stay (HOSPITAL_COMMUNITY): Payer: Medicare Other

## 2021-11-10 DIAGNOSIS — E785 Hyperlipidemia, unspecified: Secondary | ICD-10-CM | POA: Diagnosis present

## 2021-11-10 DIAGNOSIS — I480 Paroxysmal atrial fibrillation: Secondary | ICD-10-CM | POA: Diagnosis present

## 2021-11-10 DIAGNOSIS — K5733 Diverticulitis of large intestine without perforation or abscess with bleeding: Principal | ICD-10-CM | POA: Diagnosis present

## 2021-11-10 DIAGNOSIS — E871 Hypo-osmolality and hyponatremia: Secondary | ICD-10-CM | POA: Diagnosis not present

## 2021-11-10 DIAGNOSIS — K7200 Acute and subacute hepatic failure without coma: Secondary | ICD-10-CM

## 2021-11-10 DIAGNOSIS — H905 Unspecified sensorineural hearing loss: Secondary | ICD-10-CM | POA: Diagnosis present

## 2021-11-10 DIAGNOSIS — D696 Thrombocytopenia, unspecified: Secondary | ICD-10-CM | POA: Diagnosis not present

## 2021-11-10 DIAGNOSIS — E875 Hyperkalemia: Secondary | ICD-10-CM | POA: Diagnosis present

## 2021-11-10 DIAGNOSIS — M722 Plantar fascial fibromatosis: Secondary | ICD-10-CM | POA: Diagnosis present

## 2021-11-10 DIAGNOSIS — I700 Atherosclerosis of aorta: Secondary | ICD-10-CM | POA: Diagnosis present

## 2021-11-10 DIAGNOSIS — K5900 Constipation, unspecified: Secondary | ICD-10-CM | POA: Diagnosis present

## 2021-11-10 DIAGNOSIS — Z7984 Long term (current) use of oral hypoglycemic drugs: Secondary | ICD-10-CM

## 2021-11-10 DIAGNOSIS — Z79899 Other long term (current) drug therapy: Secondary | ICD-10-CM

## 2021-11-10 DIAGNOSIS — Z87891 Personal history of nicotine dependence: Secondary | ICD-10-CM

## 2021-11-10 DIAGNOSIS — N179 Acute kidney failure, unspecified: Secondary | ICD-10-CM

## 2021-11-10 DIAGNOSIS — N1832 Chronic kidney disease, stage 3b: Secondary | ICD-10-CM | POA: Diagnosis present

## 2021-11-10 DIAGNOSIS — Z85528 Personal history of other malignant neoplasm of kidney: Secondary | ICD-10-CM

## 2021-11-10 DIAGNOSIS — D631 Anemia in chronic kidney disease: Secondary | ICD-10-CM | POA: Diagnosis present

## 2021-11-10 DIAGNOSIS — Z7901 Long term (current) use of anticoagulants: Secondary | ICD-10-CM | POA: Diagnosis not present

## 2021-11-10 DIAGNOSIS — Y92002 Bathroom of unspecified non-institutional (private) residence single-family (private) house as the place of occurrence of the external cause: Secondary | ICD-10-CM

## 2021-11-10 DIAGNOSIS — Z7985 Long-term (current) use of injectable non-insulin antidiabetic drugs: Secondary | ICD-10-CM | POA: Diagnosis not present

## 2021-11-10 DIAGNOSIS — N401 Enlarged prostate with lower urinary tract symptoms: Secondary | ICD-10-CM | POA: Diagnosis present

## 2021-11-10 DIAGNOSIS — D649 Anemia, unspecified: Secondary | ICD-10-CM | POA: Diagnosis not present

## 2021-11-10 DIAGNOSIS — N170 Acute kidney failure with tubular necrosis: Secondary | ICD-10-CM | POA: Diagnosis present

## 2021-11-10 DIAGNOSIS — Z794 Long term (current) use of insulin: Secondary | ICD-10-CM

## 2021-11-10 DIAGNOSIS — E1122 Type 2 diabetes mellitus with diabetic chronic kidney disease: Secondary | ICD-10-CM | POA: Diagnosis present

## 2021-11-10 DIAGNOSIS — F419 Anxiety disorder, unspecified: Secondary | ICD-10-CM | POA: Diagnosis present

## 2021-11-10 DIAGNOSIS — Z9621 Cochlear implant status: Secondary | ICD-10-CM | POA: Diagnosis present

## 2021-11-10 DIAGNOSIS — F05 Delirium due to known physiological condition: Secondary | ICD-10-CM | POA: Diagnosis not present

## 2021-11-10 DIAGNOSIS — C641 Malignant neoplasm of right kidney, except renal pelvis: Secondary | ICD-10-CM | POA: Diagnosis present

## 2021-11-10 DIAGNOSIS — Z8616 Personal history of COVID-19: Secondary | ICD-10-CM

## 2021-11-10 DIAGNOSIS — E876 Hypokalemia: Secondary | ICD-10-CM | POA: Diagnosis not present

## 2021-11-10 DIAGNOSIS — Z66 Do not resuscitate: Secondary | ICD-10-CM | POA: Diagnosis present

## 2021-11-10 DIAGNOSIS — E1140 Type 2 diabetes mellitus with diabetic neuropathy, unspecified: Secondary | ICD-10-CM | POA: Diagnosis not present

## 2021-11-10 DIAGNOSIS — I10 Essential (primary) hypertension: Secondary | ICD-10-CM | POA: Diagnosis not present

## 2021-11-10 DIAGNOSIS — W19XXXA Unspecified fall, initial encounter: Secondary | ICD-10-CM | POA: Diagnosis present

## 2021-11-10 DIAGNOSIS — N183 Chronic kidney disease, stage 3 unspecified: Secondary | ICD-10-CM | POA: Diagnosis not present

## 2021-11-10 DIAGNOSIS — D62 Acute posthemorrhagic anemia: Secondary | ICD-10-CM

## 2021-11-10 DIAGNOSIS — R0902 Hypoxemia: Secondary | ICD-10-CM | POA: Diagnosis not present

## 2021-11-10 DIAGNOSIS — R58 Hemorrhage, not elsewhere classified: Secondary | ICD-10-CM | POA: Diagnosis not present

## 2021-11-10 DIAGNOSIS — Z905 Acquired absence of kidney: Secondary | ICD-10-CM | POA: Diagnosis not present

## 2021-11-10 DIAGNOSIS — K5791 Diverticulosis of intestine, part unspecified, without perforation or abscess with bleeding: Secondary | ICD-10-CM | POA: Diagnosis not present

## 2021-11-10 DIAGNOSIS — N281 Cyst of kidney, acquired: Secondary | ICD-10-CM | POA: Diagnosis not present

## 2021-11-10 DIAGNOSIS — K922 Gastrointestinal hemorrhage, unspecified: Secondary | ICD-10-CM | POA: Diagnosis not present

## 2021-11-10 DIAGNOSIS — I129 Hypertensive chronic kidney disease with stage 1 through stage 4 chronic kidney disease, or unspecified chronic kidney disease: Secondary | ICD-10-CM | POA: Diagnosis present

## 2021-11-10 DIAGNOSIS — N189 Chronic kidney disease, unspecified: Secondary | ICD-10-CM

## 2021-11-10 DIAGNOSIS — K5731 Diverticulosis of large intestine without perforation or abscess with bleeding: Secondary | ICD-10-CM | POA: Diagnosis not present

## 2021-11-10 DIAGNOSIS — M954 Acquired deformity of chest and rib: Secondary | ICD-10-CM | POA: Diagnosis not present

## 2021-11-10 DIAGNOSIS — R7989 Other specified abnormal findings of blood chemistry: Secondary | ICD-10-CM

## 2021-11-10 DIAGNOSIS — N400 Enlarged prostate without lower urinary tract symptoms: Secondary | ICD-10-CM | POA: Diagnosis not present

## 2021-11-10 DIAGNOSIS — D500 Iron deficiency anemia secondary to blood loss (chronic): Secondary | ICD-10-CM | POA: Diagnosis not present

## 2021-11-10 DIAGNOSIS — I959 Hypotension, unspecified: Secondary | ICD-10-CM | POA: Diagnosis not present

## 2021-11-10 DIAGNOSIS — K625 Hemorrhage of anus and rectum: Principal | ICD-10-CM

## 2021-11-10 DIAGNOSIS — E861 Hypovolemia: Secondary | ICD-10-CM | POA: Diagnosis not present

## 2021-11-10 DIAGNOSIS — E1165 Type 2 diabetes mellitus with hyperglycemia: Secondary | ICD-10-CM | POA: Diagnosis present

## 2021-11-10 DIAGNOSIS — E1110 Type 2 diabetes mellitus with ketoacidosis without coma: Secondary | ICD-10-CM | POA: Diagnosis not present

## 2021-11-10 DIAGNOSIS — Z923 Personal history of irradiation: Secondary | ICD-10-CM

## 2021-11-10 DIAGNOSIS — R338 Other retention of urine: Secondary | ICD-10-CM | POA: Diagnosis present

## 2021-11-10 DIAGNOSIS — Z888 Allergy status to other drugs, medicaments and biological substances status: Secondary | ICD-10-CM

## 2021-11-10 DIAGNOSIS — E872 Acidosis, unspecified: Secondary | ICD-10-CM | POA: Diagnosis present

## 2021-11-10 DIAGNOSIS — Z8546 Personal history of malignant neoplasm of prostate: Secondary | ICD-10-CM

## 2021-11-10 DIAGNOSIS — I9589 Other hypotension: Secondary | ICD-10-CM | POA: Diagnosis not present

## 2021-11-10 DIAGNOSIS — Z9889 Other specified postprocedural states: Secondary | ICD-10-CM | POA: Diagnosis not present

## 2021-11-10 DIAGNOSIS — E119 Type 2 diabetes mellitus without complications: Secondary | ICD-10-CM

## 2021-11-10 LAB — CBG MONITORING, ED
Glucose-Capillary: 277 mg/dL — ABNORMAL HIGH (ref 70–99)
Glucose-Capillary: 269 mg/dL — ABNORMAL HIGH (ref 70–99)
Glucose-Capillary: 285 mg/dL — ABNORMAL HIGH (ref 70–99)

## 2021-11-10 LAB — COMPREHENSIVE METABOLIC PANEL
ALT: 154 U/L — ABNORMAL HIGH (ref 0–44)
ALT: 1418 U/L — ABNORMAL HIGH (ref 0–44)
AST: 168 U/L — ABNORMAL HIGH (ref 15–41)
AST: 2148 U/L — ABNORMAL HIGH (ref 15–41)
Albumin: 2.3 g/dL — ABNORMAL LOW (ref 3.5–5.0)
Albumin: 2.2 g/dL — ABNORMAL LOW (ref 3.5–5.0)
Alkaline Phosphatase: 56 U/L (ref 38–126)
Alkaline Phosphatase: 53 U/L (ref 38–126)
Anion gap: 15 (ref 5–15)
Anion gap: 11 (ref 5–15)
BUN: 58 mg/dL — ABNORMAL HIGH (ref 8–23)
BUN: 64 mg/dL — ABNORMAL HIGH (ref 8–23)
CO2: 11 mmol/L — ABNORMAL LOW (ref 22–32)
CO2: 17 mmol/L — ABNORMAL LOW (ref 22–32)
Calcium: 8.1 mg/dL — ABNORMAL LOW (ref 8.9–10.3)
Calcium: 7.8 mg/dL — ABNORMAL LOW (ref 8.9–10.3)
Chloride: 107 mmol/L (ref 98–111)
Chloride: 107 mmol/L (ref 98–111)
Creatinine, Ser: 3.28 mg/dL — ABNORMAL HIGH (ref 0.61–1.24)
Creatinine, Ser: 3.41 mg/dL — ABNORMAL HIGH (ref 0.61–1.24)
GFR, Estimated: 17 mL/min — ABNORMAL LOW (ref >60)
GFR, Estimated: 16 mL/min — ABNORMAL LOW (ref >60)
Glucose, Bld: 295 mg/dL — ABNORMAL HIGH (ref 70–99)
Glucose, Bld: 327 mg/dL — ABNORMAL HIGH (ref 70–99)
Potassium: 6.6 mmol/L (ref 3.5–5.1)
Potassium: 4.5 mmol/L (ref 3.5–5.1)
Sodium: 133 mmol/L — ABNORMAL LOW (ref 135–145)
Sodium: 135 mmol/L (ref 135–145)
Total Bilirubin: 1.2 mg/dL (ref 0.3–1.2)
Total Bilirubin: 1.1 mg/dL (ref 0.3–1.2)
Total Protein: 4.6 g/dL — ABNORMAL LOW (ref 6.5–8.1)
Total Protein: 4.3 g/dL — ABNORMAL LOW (ref 6.5–8.1)

## 2021-11-10 LAB — CBC WITH DIFFERENTIAL/PLATELET
Abs Immature Granulocytes: 0.35 10*3/uL — ABNORMAL HIGH (ref 0.00–0.07)
Basophils Absolute: 0 10*3/uL (ref 0.0–0.1)
Basophils Relative: 0 %
Eosinophils Absolute: 0 10*3/uL (ref 0.0–0.5)
Eosinophils Relative: 0 %
HCT: 22.6 % — ABNORMAL LOW (ref 39.0–52.0)
Hemoglobin: 7.2 g/dL — ABNORMAL LOW (ref 13.0–17.0)
Immature Granulocytes: 3 %
Lymphocytes Relative: 9 %
Lymphs Abs: 1.1 10*3/uL (ref 0.7–4.0)
MCH: 28.5 pg (ref 26.0–34.0)
MCHC: 31.9 g/dL (ref 30.0–36.0)
MCV: 89.3 fL (ref 80.0–100.0)
Monocytes Absolute: 0.5 10*3/uL (ref 0.1–1.0)
Monocytes Relative: 4 %
Neutro Abs: 10.9 10*3/uL — ABNORMAL HIGH (ref 1.7–7.7)
Neutrophils Relative %: 84 %
Platelets: 221 10*3/uL (ref 150–400)
RBC: 2.53 MIL/uL — ABNORMAL LOW (ref 4.22–5.81)
RDW: 16.9 % — ABNORMAL HIGH (ref 11.5–15.5)
WBC: 12.9 10*3/uL — ABNORMAL HIGH (ref 4.0–10.5)
nRBC: 0 % (ref 0.0–0.2)

## 2021-11-10 LAB — I-STAT CHEM 8, ED
BUN: 58 mg/dL — ABNORMAL HIGH (ref 8–23)
Calcium, Ion: 1.12 mmol/L — ABNORMAL LOW (ref 1.15–1.40)
Chloride: 107 mmol/L (ref 98–111)
Creatinine, Ser: 3.5 mg/dL — ABNORMAL HIGH (ref 0.61–1.24)
Glucose, Bld: 281 mg/dL — ABNORMAL HIGH (ref 70–99)
HCT: 19 % — ABNORMAL LOW (ref 39.0–52.0)
Hemoglobin: 6.5 g/dL — CL (ref 13.0–17.0)
Potassium: 6.5 mmol/L (ref 3.5–5.1)
Sodium: 133 mmol/L — ABNORMAL LOW (ref 135–145)
TCO2: 12 mmol/L — ABNORMAL LOW (ref 22–32)

## 2021-11-10 LAB — PREPARE RBC (CROSSMATCH)

## 2021-11-10 LAB — LIPASE, BLOOD: Lipase: 34 U/L (ref 11–51)

## 2021-11-10 LAB — LACTIC ACID, PLASMA
Lactic Acid, Venous: 4.9 mmol/L (ref 0.5–1.9)
Lactic Acid, Venous: 2.9 mmol/L (ref 0.5–1.9)

## 2021-11-10 LAB — PROTIME-INR
INR: 1.4 — ABNORMAL HIGH (ref 0.8–1.2)
Prothrombin Time: 17.2 seconds — ABNORMAL HIGH (ref 11.4–15.2)

## 2021-11-10 LAB — GLUCOSE, CAPILLARY: Glucose-Capillary: 321 mg/dL — ABNORMAL HIGH (ref 70–99)

## 2021-11-10 MED ORDER — SODIUM CHLORIDE 0.9 % IV BOLUS
1000.0000 mL | Freq: Once | INTRAVENOUS | Status: AC
  Administered 2021-11-10: 1000 mL via INTRAVENOUS

## 2021-11-10 MED ORDER — SODIUM ZIRCONIUM CYCLOSILICATE 10 G PO PACK
10.0000 g | PACK | Freq: Once | ORAL | Status: AC
  Administered 2021-11-10: 10 g via ORAL
  Filled 2021-11-10: qty 1

## 2021-11-10 MED ORDER — SODIUM BICARBONATE 8.4 % IV SOLN
Freq: Once | INTRAVENOUS | Status: AC
  Filled 2021-11-10: qty 1000

## 2021-11-10 MED ORDER — INSULIN ASPART 100 UNIT/ML IV SOLN
5.0000 [IU] | Freq: Once | INTRAVENOUS | Status: AC
  Administered 2021-11-10: 5 [IU] via INTRAVENOUS

## 2021-11-10 MED ORDER — PANTOPRAZOLE INFUSION (NEW) - SIMPLE MED
8.0000 mg/h | INTRAVENOUS | Status: DC
  Filled 2021-11-10: qty 100

## 2021-11-10 MED ORDER — PIPERACILLIN-TAZOBACTAM IN DEX 2-0.25 GM/50ML IV SOLN
2.2500 g | Freq: Four times a day (QID) | INTRAVENOUS | Status: DC
Start: 2021-11-10 — End: 2021-11-11
  Administered 2021-11-10 – 2021-11-11 (×4): 2.25 g via INTRAVENOUS
  Filled 2021-11-10 (×5): qty 50

## 2021-11-10 MED ORDER — CALCIUM GLUCONATE 10 % IV SOLN
1.0000 g | Freq: Once | INTRAVENOUS | Status: AC
Start: 2021-11-10 — End: 2021-11-10
  Administered 2021-11-10: 1 g via INTRAVENOUS
  Filled 2021-11-10: qty 10

## 2021-11-10 MED ORDER — PANTOPRAZOLE 80MG IVPB - SIMPLE MED
80.0000 mg | Freq: Once | INTRAVENOUS | Status: DC
  Filled 2021-11-10: qty 100

## 2021-11-10 MED ORDER — SODIUM CHLORIDE 0.9% FLUSH
3.0000 mL | Freq: Two times a day (BID) | INTRAVENOUS | Status: DC
Start: 2021-11-10 — End: 2021-11-17
  Administered 2021-11-10 – 2021-11-17 (×14): 3 mL via INTRAVENOUS

## 2021-11-10 MED ORDER — PANTOPRAZOLE SODIUM 40 MG IV SOLR
40.0000 mg | Freq: Two times a day (BID) | INTRAVENOUS | Status: DC
  Administered 2021-11-10 – 2021-11-14 (×8): 40 mg via INTRAVENOUS
  Filled 2021-11-10 (×8): qty 10

## 2021-11-10 MED ORDER — SODIUM CHLORIDE 0.9 % IV SOLN: INTRAVENOUS | Status: AC

## 2021-11-10 MED ORDER — CALCIUM GLUCONATE-NACL 1-0.675 GM/50ML-% IV SOLN: 1.0000 g | Freq: Once | INTRAVENOUS | Status: DC

## 2021-11-10 MED ORDER — HYDROXYZINE HCL 50 MG/ML IM SOLN
25.0000 mg | Freq: Once | INTRAMUSCULAR | Status: DC
  Filled 2021-11-10: qty 0.5

## 2021-11-10 MED ORDER — SODIUM BICARBONATE 8.4 % IV SOLN
50.0000 meq | Freq: Once | INTRAVENOUS | Status: AC
  Administered 2021-11-10: 50 meq via INTRAVENOUS
  Filled 2021-11-10: qty 50

## 2021-11-10 MED ORDER — SODIUM CHLORIDE 0.9 % IV SOLN: INTRAVENOUS | Status: DC

## 2021-11-10 MED ORDER — PANTOPRAZOLE SODIUM 40 MG PO TBEC
40.0000 mg | DELAYED_RELEASE_TABLET | Freq: Every day | ORAL | Status: DC
  Administered 2021-11-10: 40 mg via ORAL
  Filled 2021-11-10: qty 1

## 2021-11-10 MED ORDER — SODIUM CHLORIDE 0.9 % IV SOLN
10.0000 mL/h | Freq: Once | INTRAVENOUS | Status: AC
  Administered 2021-11-10: 10 mL/h via INTRAVENOUS

## 2021-11-10 MED ORDER — INSULIN ASPART 100 UNIT/ML IJ SOLN
0.0000 [IU] | INTRAMUSCULAR | Status: DC
  Administered 2021-11-10: 3 [IU] via SUBCUTANEOUS
  Administered 2021-11-10: 4 [IU] via SUBCUTANEOUS
  Administered 2021-11-11: 3 [IU] via SUBCUTANEOUS

## 2021-11-10 NOTE — ED Triage Notes (Addendum)
Pt arrives by EMS from [facility], ED to ED transfer d/t other facility not having GI. Pt had been found on bathroom floor by son, blood on bathroom floor. Pt here for GI bleed, frank red blood coming from rectum, received 1 unit blood at previous ED before transfer. C/O abd pain when area mashed

## 2021-11-10 NOTE — Consult Note (Signed)
Nephrology Consult   Requesting provider: [facility] requesting consult: Int Med Reason for consult: AKI, hyperkalemia   Assessment/Recommendations: David Steele is a/an 88 y.o. male with a past medical history HTN, HLD, DM2, afib who present w/ GI bleeding c/b AKI and hyperkalemia  AKI on CKD 3b: Unclear UOP. BL Crt at least 1.8 after nephrectomy but baseline may be even higher; no recent outpatient labs. Crt now 3.5 and was 2.4 at OSH which is a fairly concerning rise. Most likely pre-renal with possible ATN from hypotensive episodes. Given his age we will try to use aggressive medical therapy first and avoid HD if possible -Continue supportive care with IV fluid -Acidosis and hyperkalemia management as below -Continue to monitor daily Cr, Dose meds for GFR -Monitor Daily I/Os, Daily weight  -Maintain MAP>65 for optimal renal perfusion.  -Avoid nephrotoxic medications including NSAIDs -Use synthetic opioids (Fentanyl/Dilaudid) if needed -Holding on dialysis for now.  Given the patient's age she is a poor dialysis candidate.  We will try to discuss with his family -Hold home lisinopril  GI bleeding: GI involved.  Likely diverticular bleed on anticoagulation.  Currently supportive measures and likely need colonoscopy at some point.  Continue transfusions as needed  Hyperkalemia: Likely associated with AKI as well as resorption of blood through the GI tract.  Continue with Lokelma as well as IV bicarbonate.  Monitor potassium closely today and use shifting agents and calcium gluconate as needed for EKG abnormalities  Metabolic acidosis: Bicarbonate 12 on blood gas.  Lactate elevated to 4 hours.  Continue with volume resuscitation and IV bicarbonate therapy  Hyponatremia: Sodium minimally decreased.  Likely associated with AKI.  Continue to monitor  Hypertension: Blood pressure medications  Atrial fibrillation: Holding home medications  History of RCC: Status post  right nephrectomy in March 2023  Uncontrolled type 2 diabetes with hyperglycemia: Management per primary team   Recommendations conveyed to primary service.    [facility] 11/10/2021 1:07 PM   _____________________________________________________________________________________ CC: GI bleeding  History of Present Illness: David Steele is a/an 88 y.o. male with a past medical history of HTN, HLD, DM2, afib who presents with GI bleeding.  History was significantly limited due to the patient's difficult hearing and speech that is difficult to understand.  Family was not at bedside when I came by.  Unable to reach family by phone.  History was obtained per chart review  Patient presented to an outside hospital initially with large amounts of bright red blood per rectum for several days.  He was found to have a hemoglobin of 5 when he arrived to the outside hospital and was given 1 unit of blood as well as medication for reversal of his Eliquis.  The patient was able to tell some providers that he was having abdominal pain and also fell in the bathroom but did not sustain any significant injuries and did not remember hitting his head.  History is otherwise fairly limited.  He was transferred for evaluation by GI and possible need for IR.    Family told other providers that the patient was very independent and still drove at baseline.  Sounds like he would want fairly aggressive efforts at this time.  Limited data for the patient's baseline creatinine.  He had a right nephrectomy on 07/29/2021 and creatinine on 07/30/2021 was 1.9.  No other recent creatinines.  His creatinine was 2.4 when he arrived to the outside hospital.  Renal ultrasound demonstrated left renal cyst  and absent right kidney.  Medications:  Current Facility-Administered Medications  Medication Dose Route Frequency Provider Last Rate Last Admin   insulin aspart (novoLOG) injection 0-6 Units   0-6 Units Subcutaneous Q4H Candee Furbish, MD   3 Units at 11/10/21 1257   pantoprazole (PROTONIX) injection 40 mg  40 mg Intravenous Q12H Candee Furbish, MD       sodium bicarbonate 150 mEq in dextrose 5 % 1,150 mL infusion   Intravenous Once Rancour, Annie Main, MD 100 mL/hr at 11/10/21 1102 New Bag at 11/10/21 1102   sodium chloride flush (NS) 0.9 % injection 3 mL  3 mL Intravenous Q12H Marianna Payment, MD       Current Outpatient Medications  Medication Sig Dispense Refill   acetaminophen (TYLENOL) 650 MG CR tablet Take 650 mg by mouth daily.     apixaban (ELIQUIS) 5 MG TABS tablet Take 5 mg by mouth 2 (two) times daily.     Brinzolamide-Brimonidine (SIMBRINZA) 1-0.2 % SUSP Apply 1 drop to eye in the morning, at noon, and at bedtime.     busPIRone (BUSPAR) 15 MG tablet Take 15 mg by mouth 2 (two) times daily.     dorzolamide-timolol (COSOPT) 22.3-6.8 MG/ML ophthalmic solution Place 1 drop into the right eye 2 (two) times daily.     ferrous sulfate 325 (65 FE) MG tablet Take 325 mg by mouth daily with breakfast.     finasteride (PROSCAR) 5 MG tablet Take 5 mg by mouth daily.     insulin NPH-regular Human (70-30) 100 UNIT/ML injection Inject 30 Units into the skin 2 (two) times daily with a meal. 30 units before meal in AM and 25 units in evening before meal Subcutaneous Patient uses vials     lansoprazole (PREVACID) 30 MG capsule Take 30 mg by mouth daily.     lisinopril (ZESTRIL) 20 MG tablet TAKE 1 TABLET(20 MG) BY MOUTH DAILY 90 tablet 1   metFORMIN (GLUCOPHAGE) 1000 MG tablet Take 1,000 mg by mouth 2 (two) times daily with a meal.      mirabegron ER (MYRBETRIQ) 50 MG TB24 tablet Take 50 mg by mouth at bedtime.     Multiple Vitamins-Minerals (CENTRUM SILVER ADULT 50+ PO) Take 1 tablet by mouth daily. Unknown strength     ondansetron (ZOFRAN) 4 MG tablet Take 4 mg by mouth 2 (two) times daily as needed.     PARoxetine (PAXIL) 30 MG tablet Take 30 mg by mouth daily.     traZODone (DESYREL) 50  MG tablet Take 50 mg by mouth at bedtime as needed.       ALLERGIES Simvastatin  MEDICAL HISTORY Past Medical History:  Diagnosis Date   Acute respiratory failure with hypoxia ([place]) 07/03/2019   Advanced age 54/10/2018   Anemia    Anxiety    Aortic atherosclerosis (HCC)    Benign essential HTN 02/23/2017   Bladder cancer ([place])    BPH (benign prostatic hyperplasia)    Cerebral ischemia    CKD (chronic kidney disease), stage III ([place]) 07/03/2019   Current use of long term anticoagulation 04/19/2019   Depressive disorder    Diabetes mellitus type 2, uncontrolled, with complications 32/99/2426   DOE (dyspnea on exertion) 02/23/2017   Dyslipidemia 07/01/2019   First degree AV block 12/14/2017   Former smoker 06/15/2017   History of bladder cancer 02/23/2017   HLD (hyperlipidemia) 07/03/2019   Hyperlipidemia    Hypertension    Orthostatic hypotension 08/31/2010   Paroxysmal atrial fibrillation ([place]) 02/23/2017  Paroxysmal ventricular tachycardia ([place]) 05/19/2020   Plantar fasciitis    Pneumonia due to COVID-19 virus 07/01/2019   Profound sensorineural hearing loss ([facility]) 11/13/2017   Formatting of this note might be different from the original. Added automatically from request for surgery [id number]   Prostate cancer ([place]) 07/03/2019   Sinus bradycardia 02/23/2017   T2DM (type 2 diabetes mellitus) ([place]) 07/01/2019     SOCIAL HISTORY Social History   Socioeconomic History   Marital status: Single    Spouse name: Not on file   Number of children: Not on file   Years of education: Not on file   Highest education level: Not on file  Occupational History   Occupation: reired  Tobacco Use   Smoking status: Former    Types: Cigarettes    Quit date: 05/16/1988    Years since quitting: 33.5   Smokeless tobacco: Never  Vaping Use   Vaping Use: Former  Substance and Sexual Activity   Alcohol use: No   Drug use: No   Sexual activity: Not Currently  Other Topics Concern    Not on file  Social History Narrative   Not on file   Social Determinants of Health   Financial Resource Strain: Not on file  Food Insecurity: Not on file  Transportation Needs: Not on file  Physical Activity: Not on file  Stress: Not on file  Social Connections: Not on file  Intimate Partner Violence: Not on file     FAMILY HISTORY History reviewed. No pertinent family history. Patient unable to provide due to poor hearing and difficult vocalization   Review of Systems: Patient unable to provide due to poor hearing and difficult vocalization  Physical Exam: Vitals:   11/10/21 1015 11/10/21 1245  BP: (!) 131/57 133/74  Pulse: 67 85  Resp: 16 17  Temp:  (!) 96.7 F (35.9 C)  SpO2: 100% 99%   Total I/O In: -  Out: 75 [Urine:75]  Intake/Output Summary (Last 24 hours) at 11/10/2021 1307 Last data filed at 11/10/2021 1142 Gross per 24 hour  Intake --  Output 75 ml  Net -75 ml   General: ill-appearing, no acute distress HEENT: anicteric sclera, oropharynx clear without lesions CV: regular rate, normal rhythm, no murmurs or rubs Lungs: faint crackles at the left lower lung area, otherwise clear, mild iwob Abd: soft, non-tender, mild distention Skin: no visible lesions or rashes Psych: alert, engaged, appropriate mood and affect Musculoskeletal: no obvious deformities Neuro: Significantly decreased hearing, speech difficult to understand but likely chronic, no other deficits  Test Results Reviewed Lab Results  Component Value Date   NA 133 (L) 11/10/2021   K 6.5 (HH) 11/10/2021   CL 107 11/10/2021   CO2 11 (L) 11/10/2021   BUN 58 (H) 11/10/2021   CREATININE 3.50 (H) 11/10/2021   GFR 82.39 09/26/2011   CALCIUM 8.1 (L) 11/10/2021   ALBUMIN 2.3 (L) 11/10/2021   PHOS 1.9 (L) 07/06/2019    CBC Recent Labs  Lab 11/10/21 0925 11/10/21 0935  WBC 12.9*  --   NEUTROABS 10.9*  --   HGB 7.2* 6.5*  HCT 22.6* 19.0*  MCV 89.3  --   PLT 221  --     I have  reviewed all relevant outside healthcare records related to the patient's current hospitalization

## 2021-11-10 NOTE — Progress Notes (Signed)
Pharmacy Antibiotic Note  David Steele is a 88 y.o. male for which pharmacy has been consulted for zosyn dosing for  Diverticulitis with LGIB .  Patient with a history of CKD, AF, renal cell carcinoma w/ laparoscopic rt nephrectomy in 07/2021, DM, diverticulitis. Patient presenting with lower GIB and hypovolemia.  SCr 3.41 - 1.86 on 07/30/21 from [place] [facility] the [facility] records WBC 12.9; LA 4.9; T 97.6 F; HR 87; RR 20  Plan: Zosyn 2.25g q6h Trend WBC, Fever, Renal function, & Clinical course F/u cultures, clinical course, WBC, fever De-escalate when able  Height: '5\' 11"'$  (180.3 cm) Weight: 77.5 kg (170 lb 14.4 oz) IBW/kg (Calculated) : 75.3  Temp (24hrs), Avg:97.1 F (36.2 C), Min:96.7 F (35.9 C), Max:97.4 F (36.3 C)  Recent Labs  Lab 11/10/21 0925 11/10/21 0935 11/10/21 1152  WBC 12.9*  --   --   CREATININE 3.28* 3.50*  --   LATICACIDVEN  --   --  4.9*    Estimated Creatinine Clearance: 14 mL/min (A) (by C-G formula based on SCr of 3.5 mg/dL (H)).    Allergies  Allergen Reactions   Simvastatin Other (See Comments)    Leg pain    Antimicrobials this admission: zosyn 6/28 >>    Microbiology results: Pending  Thank you for allowing pharmacy to be a part of this patient's care.  Lorelei Pont, PharmD, BCPS 11/10/2021 2:34 PM ED Clinical Pharmacist -  [phone]

## 2021-11-10 NOTE — ED Notes (Signed)
Attending MD at bedside. Stat labs obtained, pt okay to go to PCU. Receiving nurse informed.

## 2021-11-10 NOTE — Consult Note (Addendum)
[facility] Consult: 8:39 AM 11/10/2021  LOS: 0 days    Referring Provider: Dr Wyvonnia Dusky in ED  Primary Care Physician:  Ernestene Kiel, MD Primary Gastroenterologist:  unassigned   There were no records/papers from the outside hospital in the patient's room.  Reason for Consultation:  Hematochezia.    HPI: David Steele is a 88 y.o. male.  PMH as listed below pertinent for PAF, on Eliquis.  CKD 3.  IDDM.  Hearing loss, cochlear implant 2019.  Prostate cancer, ?  Radiation seed implant 10 to 15 years ago.  Bladder cancer.  Renal cell carcinoma diagnosed 05/2021, laparoscopic R nephrectomy 07/29/2021 in [place].  Remote diverticulitis.  Hernia repair Not clear if he has ever had a colonoscopy or EGD, if he has had either, it was many years ago.  Patient transferred from [facility] due to lack of GI coverage.  3 to 4 days of hematochezia w no significant abdominal pain.  Had a fall in his bathroom but did not sustain injuries..  At outside hospital arrival Hgb 5.7, creatinine 2.4.  Received 1 PRBC and Feiba to reverse Eliquis.  Hgb 9.5, MCV 74 in mid March 2023.  His daily med list includes iron sulfate daily but not clear he is taking this. Prevacid prn.  GI baseline review of systems is unremarkable.  No significant constipation, heartburn.  No dysphagia.  No wounds observed him choking when he eats.  Patient denies even minor bleeding per rectum until the last few days.  At arrival to [facility] ED BP 134/51.  Heart rate 61.  Temp 97.4. room air sats 100% Has not had repeat labs obtained.  No imaging due to elevated creatinine.    Fm hx children with diabetes and hypertension.  Son and grandson with diverticulosis, diverticulitis Previous smoker.  Does not consume alcohol. Lives independently,  still drives.  Does some of his own cooking and cleaning.  Retired Lawyer.    Past Medical History:  Diagnosis Date   Acute respiratory failure with hypoxia ([place]) 07/03/2019   Advanced age 44/10/2018   Anemia    Anxiety    Aortic atherosclerosis (HCC)    Benign essential HTN 02/23/2017   Bladder cancer (HCC)    BPH (benign prostatic hyperplasia)    Cerebral ischemia    CKD (chronic kidney disease), stage III ([place]) 07/03/2019   Current use of long term anticoagulation 04/19/2019   Depressive disorder    Diabetes mellitus type 2, uncontrolled, with complications 17/61/6073   DOE (dyspnea on exertion) 02/23/2017   Dyslipidemia 07/01/2019   First degree AV block 12/14/2017   Former smoker 06/15/2017   History of bladder cancer 02/23/2017   HLD (hyperlipidemia) 07/03/2019   Hyperlipidemia    Hypertension    Orthostatic hypotension 08/31/2010   Paroxysmal atrial fibrillation ([place]) 02/23/2017   Paroxysmal ventricular tachycardia ([place]) 05/19/2020   Plantar fasciitis    Pneumonia due to COVID-19 virus 07/01/2019   Profound sensorineural hearing loss (SNHL) 11/13/2017   Formatting of this note might be different from the original. Added automatically  from request for surgery [id number]   Prostate cancer ([place]) 07/03/2019   Sinus bradycardia 02/23/2017   T2DM (type 2 diabetes mellitus) ([place]) 07/01/2019    Past Surgical History:  Procedure Laterality Date   BLADDER SURGERY  2007   Cancerous tumor removed   EYE SURGERY     TONSILLECTOMY      Prior to Admission medications   Medication Sig Start Date End Date Taking? Authorizing Provider  acetaminophen (TYLENOL) 650 MG CR tablet Take 650 mg by mouth daily.    [provider]  apixaban (ELIQUIS) 5 MG TABS tablet Take 5 mg by mouth 2 (two) times daily.    [provider]  Brinzolamide-Brimonidine [facility]) 1-0.2 % SUSP Apply 1 drop to eye in the morning, at noon, and at bedtime.    [provider]  ferrous sulfate 325 (65 FE) MG tablet Take 325 mg by mouth daily with breakfast.    [provider]  finasteride (PROSCAR) 5 MG tablet Take 5 mg by mouth daily.    [provider]  insulin NPH-regular Human (70-30) 100 UNIT/ML injection Inject 30 Units into the skin 2 (two) times daily with a meal. 30 units before meal in AM and 25 units in evening before meal Subcutaneous Patient uses vials    [provider]  lansoprazole (PREVACID) 30 MG capsule Take 30 mg by mouth daily. 01/13/21   [provider]  lisinopril (ZESTRIL) 20 MG tablet TAKE 1 TABLET(20 MG) BY MOUTH DAILY 04/06/21   Park Liter, MD  metFORMIN (GLUCOPHAGE) 1000 MG tablet Take 1,000 mg by mouth 2 (two) times daily with a meal.     [provider]  mirabegron ER (MYRBETRIQ) 50 MG TB24 tablet Take 50 mg by mouth at bedtime.    [provider]  Multiple Vitamins-Minerals (CENTRUM SILVER ADULT 50+ PO) Take 1 tablet by mouth daily. Unknown strength    [provider]  PARoxetine (PAXIL) 30 MG tablet Take 30 mg by mouth daily.    [provider]    Scheduled Meds:  Infusions:  sodium chloride     And   sodium chloride     sodium chloride     pantoprazole     pantoprazole     PRN Meds:    Allergies as of 11/10/2021 - Review Complete 11/10/2021  Allergen Reaction Noted   Simvastatin Other (See Comments) 03/11/2020    History reviewed. No pertinent family history.  Social History   Socioeconomic History   Marital status: Single    Spouse name: Not on file   Number of children: Not on file   Years of education: Not on file   Highest education level: Not on file  Occupational History   Occupation: reired  Tobacco Use   Smoking status: Former    Types: Cigarettes    Quit date: 05/16/1988    Years since quitting: 33.5   Smokeless tobacco: Never  Vaping Use   Vaping Use: Former  Substance and Sexual Activity   Alcohol use: No   Drug  use: No   Sexual activity: Not Currently  Other Topics Concern   Not on file  Social History Narrative   Not on file   Social Determinants of Health   Financial Resource Strain: Not on file  Food Insecurity: Not on file  Transportation Needs: Not on file  Physical Activity: Not on file  Stress: Not on file  Social Connections: Not on file  Intimate Partner Violence: Not  on file    REVIEW OF SYSTEMS: Constitutional: Generally no weakness or fatigue. ENT:  No nose bleeds Pulm: No shortness of breath or cough CV:  No palpitations, no LE edema.  No angina. GU:  No hematuria, no frequency.  No oliguria. GI: See HPI. Heme: Denies excessive or unusual bleeding or bruising Transfusions: None until the last 24 hours. Neuro: Dizziness for about a month.  Referred to PT to address this and then referred to cardiology.  No seizures, no syncope. Derm:  No itching, no rash or sores.  Endocrine:  No sweats or chills.  No polyuria or dysuria Immunization: Reviewed. Travel:  None beyond local counties in last few months.    PHYSICAL EXAM: Vital signs in last 24 hours: Vitals:   11/10/21 0814  BP: (!) 134/51  Pulse: 61  Resp: 17  Temp: (!) 97.4 F (36.3 C)  SpO2: 100%   Wt Readings from Last 3 Encounters:  11/10/21 77.5 kg  07/15/21 96.2 kg  07/05/21 96.2 kg    General: Aged.  Although he looks moderately ill, pale.  Somewhat frail. Head: No facial asymmetry or swelling.  No signs of head trauma. Eyes: Conjunctiva moderately pale. Ears: Very hard of hearing Nose: No congestion or discharge Mouth: Mucosa is moist, pink, clear.  Tongue midline.  Dentures in place. Neck: No JVD Lungs: Clear bilaterally. Heart: RRR.  No MRG.  S1, S2 present Abdomen: Soft without tenderness.  Active bowel sounds.  No distention.  No organomegaly, bruits, hernias.   Rectal: Deferred.  Daughter-in-law states that the stool she saw was bright red blood with clots.  ED exam mentions bright red blood  in patient's diaper. Musc/Skeltl: No joint redness, swelling or gross deformity. Extremities: No CCE. Neurologic: Hard of hearing.  Moves all 4 limbs.  No tremor.  Strength not tested. Skin: No significant bruising.  No open sores or rash. Nodes: No cervical adenopathy Psych: Calm, cooperative.  Intake/Output from previous day: No intake/output data recorded. Intake/Output this shift: No intake/output data recorded.  LAB RESULTS: No results for input(s): "WBC", "HGB", "HCT", "PLT" in the last 72 hours. BMET Lab Results  Component Value Date   NA 140 07/06/2019   NA 139 07/05/2019   NA 136 07/04/2019   K 3.8 07/06/2019   K 4.6 07/05/2019   K 4.3 07/04/2019   CL 105 07/06/2019   CL 104 07/05/2019   CL 103 07/04/2019   CO2 24 07/06/2019   CO2 26 07/05/2019   CO2 23 07/04/2019   GLUCOSE 40 (LL) 07/06/2019   GLUCOSE 297 (H) 07/05/2019   GLUCOSE 243 (H) 07/04/2019   BUN 49 (H) 07/06/2019   BUN 54 (H) 07/05/2019   BUN 57 (H) 07/04/2019   CREATININE 1.12 07/06/2019   CREATININE 1.23 07/05/2019   CREATININE 1.40 (H) 07/04/2019   CALCIUM 9.1 07/06/2019   CALCIUM 9.0 07/05/2019   CALCIUM 8.6 (L) 07/04/2019   LFT No results for input(s): "PROT", "ALBUMIN", "AST", "ALT", "ALKPHOS", "BILITOT", "BILIDIR", "IBILI" in the last 72 hours. PT/INR No results found for: "INR", "PROTIME" Hepatitis Panel No results for input(s): "HEPBSAG", "HCVAB", "HEPAIGM", "HEPBIGM" in the last 72 hours. C-Diff No components found for: "CDIFF" Lipase  No results found for: "LIPASE"  Drugs of Abuse  No results found for: "LABOPIA", "COCAINSCRNUR", "LABBENZ", "AMPHETMU", "THCU", "LABBARB"   RADIOLOGY STUDIES: No results found.    IMPRESSION:   Painless rectal bleeding.  r/O diverticular bleed, neoplasia, vascular malformation, colitis, radiation proctitis.  Hemodynamically stable.  ABL anemia in setting of anemia of chronic kidney disease, ?iron deficiency.  Not clear if he has been  compliant with prescribed iron.  PAF, on chronic Eliquis.  Last dose not documented.  Received Feiba at [facility].    CKD with reported creatinine at outside hospital of 2.4 limits ability to administer IV contrast.  Right nephrectomy 07/29/2021 for renal cell carcinoma.  History of prostate cancer, treated with what sounds like radiation seed implant.    Hx bladder cancer, no details    IDDM   PLAN:       ? Colonoscopy---will d/w Dr Henrene Pastor.      Ok for clear liquids, ordered.    DCd the PPI drip, this is LGIB.     Azucena Freed  11/10/2021, 8:39 AM Phone [phone]  GI ATTENDING  History, laboratories, (no x-rays) reviewed.  Patient personally seen and examined.  Agree with comprehensive consultation note as outlined above.  Elderly gentleman with multiple medical problems who was transferred from an outside hospital with hemodynamically stable persistent painless hematochezia in the face of chronic anticoagulation therapy.  This most likely represents a diverticular bleed.  Radiation proctitis from prostate cancer treatment is also a possibility.  Other differential considerations include AVMs and cancer.  At this point, agree with supportive measures including transfusion to an appropriate hemoglobin.  We may want to pursue colonoscopy, though he is high risk.  Will be optimal to get his hemoglobin around 8.  We will follow.  I have discussed this in detail with the patient, his son, and his daughter-in-law at the bedside in the emergency room.  Docia Chuck. Geri Seminole., M.D. [facility] of Gastroenterology

## 2021-11-10 NOTE — Hospital Course (Addendum)
David Steele is a 88 y.o. male with a past medical history of CKD3, atherosclerosis, renal cell carcinoma s/p R nephrectomy, IDDM, and A-fib on Eliquis anticoagulation at home admitted for 3-4 days of lower GI bleed likely secondary to diverticulosis.  On the morning of 6/28, the patient called his grandson to help him stand from the toilet. The patient was found weak and unable to get up with a seemingly large amount of fresh blood in the toilet bowl. He did not lose consciousness or fall. The patient endorsed noticing rectal bleeding as early as 6/25 and had been feeling weak and shorter of breath for several weeks. At baseline, patient was reportedly independent in all ADL/IADLs and lived alone, with family checking in on him frequently. EMS was called and the patient was transported to [facility] and then transferred to [facility] for GI consult shortly after.  Lower GI bleed in setting of diverticulosis Symptomatic anemia 2/2 lower GI bleed Patient initially presented with hemoglobin of 5.7 and creatinine of 2.4 in [place] ED. He was given 1 unit PRBC and Feiba reversal agent for Eliquis before transfer to [facility]. At [facility] ED, hemoglobin trended down to 6.5 and creatinine rose to 3.5. Potassium was 6.5. Patient was started on pantoprazole, sodium bicarb, Lokelma, and maintenance fluids. On admission, he was given 2 more units of PRBCs. Gastroenterology and nephrology were consulted for management of the patient's complex issues. CT scan with contrast was the preferred imaging modality, but was contraindicated in this patient due to CKD.  On 6/29, the patient was given 2 more units of PRBCs and a 1L LR bolus. He continued to show signs of hemodynamic instability and became progressively more delirious. GI considered colonoscopy, but it was deferred due to profuse rectal bleeding and limited likelihood of scope visibility in the colon. IR was consulted for possible tagged RBC scan,  but scan was deferred because the patient was too agitated to reasonably sit still for the 3.5 hours necessary for the scan.  After discussion with the family and the patient's son (who is the HPOA), the patient's code status was changed to DNR because resuscitation efforts were believed unlikely to be successful.  On 6/30, IR attempted a limited angiogram (with 40 CCs contrast) of the IMA, SMA, and ileocolic artery, but unfortunately found no targets for embolization. On 7/2, s/p 7 units PRBC, the patient became borderline thrombocytopenic after a gradual downtrend in his platelets. FFP was given to replete coagulation factors given the absence of coagulation factors in PRBCs.  The patient's bleeding slowed dramatically by 7/2 and ceased on 7/3. His mental status, physical strength, and labs continued to improve rapidly during this time. By discharge, the patient had received a total of 7 units PRBC and 1 unit FFP. Hemoglobin rose to 8.3 from a nadir of 6.7 and hematocrit rose to 25.4 from a nadir of 19.6 during this hospitalization. At discharge, he was evaluated by OT and recommended home health with family support and close follow-up.   AKI on CKD 3 Non anion gap metabolic acidosis Hyperkalemia Patient has underlying kidney disease and presented with AKI in the setting of hypovolemia 2/2 GI bleed. On admission, creatinine was trending up and peaked at 3.50 on 6/28. Foley catheter was placed on 6/29 and urine output increased markedly and creatinine began trending down.  On discharge creatinine was 1.68, compared to an estimated baseline of 1.4-1.6 (unclear due to lack of outpatient labs status post right nephrectomy).  GFR  continued to improve in tandem with creatinine.  While AKI etiology was initially believed to be ATN due to hypovolemia, the rapid downtrend in creatinine of approximately 0.5 within 12 hours of Foley placement indicated outlet obstruction as a contributing factor.  Flomax was started  on 7/2 and Foley was removed on 7/3 part of a voiding trial.  The patient was also hyperkalemic on admission to 6.5, but after administration of Lokelma and calcium gluconate, the hyperkalemia resolved by 6/29.  Patient was acidotic with a bicarb of 11 on admission likely due to hypovolemia and poor perfusion. His acidosis resolved following daily treatment with fluids and bicarb administration by 7/2. He became hypokalemic on 7/3 and was given an infusion of KCl and magnesium at that time, after which his potassium rose to normal levels.   Ischemic hepatitis On 6/29, the patient's labs showed an initial sharp increase in LFTs consistent with endorgan damage, likely due to ischemic hepatitis in the setting of hypovolemia and anemia limiting perfusion of the liver. Over the next few days, AST and ALT began gradually trending down. Treatment included rehydration and transfusion of PRBCs as management of the underlying causes of the hepatitis such as anemia and hypovolemia.  At discharge the patient's AST and ALT values had returned to 32 and 180, respectively.  HTN The patient took lisinopril at home, but it was held during the patient's admission due to its potential renal toxicity and concerns surrounding hypotension give the patient's active bleeding. Following resolution of bleeding by 7/3, the patient's hypertension was treated with carvedilol 6.25 mg BID. He continued to be hypertensive and carvedilol was increased to 12.5 mg BID, but he became bradycardic and he was kept on 6.25 mg carvedilol BID and started on amlodipine 2.5 mg daily instead.  Paroxysmal A-fib EKG on admission did not show active A-fib. Held home Eliquis and reversed it with Feiba on admission given GI bleed. Placed patient on continuous cardiac monitoring. Based on a CHA2DS2-VASc Score of 5, the patient has a 7.2% stroke risk per year.  Did not restart Eliquis upon discharge per discussion with family because bleeding risk was deemed  too high considering the severity of the patient's current admission. Patient was started on 6.25 mg carvedilol for HTN control on 7/2 with the added benefit of rate control for A-fib.  Insulin-dependent diabetes mellitus Patient presented with blood glucose elevated to 300, with goal being <180.  The patient normally takes 25 units regular insulin BID at home, and he was started on CBG monitoring Q4 with 5 units of long-acting insulin as well as sliding scale short acting insulin with meals.  The patient's blood glucose remained elevated, but began trending down on 6/30, when patient's kidney function began to improve.  On 7/1 patient's long-acting insulin was increased to 10 units for better coverage.  Blood glucose continued to trend down throughout the rest of admission, but was frequently above 200 at discharge.  Patient was discharged on 20 units long acting BID with close follow-up with PCP and asked to keep a log of home blood glucose measurements for further adjustment of regimen.  Depression The patient's mental status improved starting on 7/2. On 7/4, he reported feeling low mood and depressed. At that time, it was learned that at home, he takes buspirone 15 mg BID, paroxetine 30 mg daily, and trazodone 50 mg at bedtime. His buspirone and trazodone were restarted on 7/4. He did not report any depressive symptoms on the day of discharge.

## 2021-11-10 NOTE — Consult Note (Signed)
NAME:  Benjamim Harnish, MRN:  [id number], DOB:  [birth year], LOS: 0 ADMISSION DATE:  11/10/2021, CONSULTATION DATE:  11/10/21 REFERRING MD:  Rancour, CHIEF COMPLAINT:  Hematochezia   History of Present Illness:  88 year old man with hx of afib on eliquis, CKD, recent nephrectomy presenting with abdominal pain, hematochezia for past several days as well as a week or two of dizziness.  Went to [facility] initially but then sent to [facility] as [facility] had no GI coverage.  Hemodynamically stable but has AKI, anemia, hyperkalemia so PCCM consulted.  Pertinent  Medical History  AVB1 RCC s/p nephrectomy  Distant hx bladder/prostate ca HTN HLD  Significant Hospital Events: Including procedures, antibiotic start and stop dates in addition to other pertinent events   6/28 admitted  Interim History / Subjective:  Consulted  Objective   Blood pressure (!) 114/47, pulse 65, temperature (!) 97.4 F (36.3 C), resp. rate (!) 22, height '5\' 11"'$  (1.803 m), weight 77.5 kg, SpO2 100 %.       No intake or output data in the 24 hours ending 11/10/21 1038 Filed Weights   11/10/21 0815  Weight: 77.5 kg    Examination: General: no distress laying in bed HENT: MM dry, trachea midline Lungs: clear, no wheezing Cardiovascular: RRR, ext warm Abdomen: soft, minimal tenderness to deep palpation Extremities: no edema Neuro: extremely hard of hearing, otherwise able to move all 4 ext Skin: pale, age-related changes  Resolved Hospital Problem list   N/A  Assessment & Plan:  ABLA, GIB- symptoms more c/w smoldering onset (~1 week), HD stable, given [facility] for eliquis PTA Acute kidney injury with hyperkalemia on iSTAT- CMP pending, temporizing measures already ordered.  K was in 5s at [place].  Hx of recent nephrectomy and baseline CKD. Hx afib on eliquis PTA Hx bladder/prostate ca and RCC  HD stable, would f/u potassium and treat medically.  Check renal US.  PPI to IV BID.  2 units pRBC as ordered.   Endoscopic evaluation when cleared by anesthesia.   Given subacute onset of symptoms and hemodynamic stability do not see role for ICU level care at this time.  Please page ICU pager on call should his condition change.  Discussed with family and Dr. Wyvonnia Dusky.  Best Practice (right click and "Reselect all SmartList Selections" daily)  Per primary  Labs   CBC: Recent Labs  Lab 11/10/21 0925 11/10/21 0935  WBC 12.9*  --   NEUTROABS 10.9*  --   HGB 7.2* 6.5*  HCT 22.6* 19.0*  MCV 89.3  --   PLT 221  --     Basic Metabolic Panel: Recent Labs  Lab 11/10/21 0935  NA 133*  K 6.5*  CL 107  GLUCOSE 281*  BUN 58*  CREATININE 3.50*   GFR: Estimated Creatinine Clearance: 14 mL/min (A) (by C-G formula based on SCr of 3.5 mg/dL (H)). Recent Labs  Lab 11/10/21 0925  WBC 12.9*    Liver Function Tests: No results for input(s): "AST", "ALT", "ALKPHOS", "BILITOT", "PROT", "ALBUMIN" in the last 168 hours. No results for input(s): "LIPASE", "AMYLASE" in the last 168 hours. No results for input(s): "AMMONIA" in the last 168 hours.  ABG    Component Value Date/Time   TCO2 12 (L) 11/10/2021 0935     Coagulation Profile: Recent Labs  Lab 11/10/21 0925  INR 1.4*    Cardiac Enzymes: No results for input(s): "CKTOTAL", "CKMB", "CKMBINDEX", "TROPONINI" in the last 168 hours.  HbA1C: Hgb A1c MFr Bld  Date/Time  Value Ref Range Status  07/01/2019 06:39 PM 8.3 (H) 4.8 - 5.6 % Final    Comment:    (NOTE) Pre diabetes:          5.7%-6.4% Diabetes:              >6.4% Glycemic control for   <7.0% adults with diabetes     CBG: No results for input(s): "GLUCAP" in the last 168 hours.  Review of Systems:    Positive Symptoms in bold:  Constitutional fevers, chills, weight loss, fatigue, anorexia, malaise  Eyes decreased vision, double vision, eye irritation  Ears, Nose, Mouth, Throat sore throat, trouble swallowing, sinus congestion  Cardiovascular chest pain, paroxysmal  nocturnal dyspnea, lower ext edema, palpitations   Respiratory SOB, cough, DOE, hemoptysis, wheezing  Gastrointestinal nausea, vomiting, diarrhea  Genitourinary burning with urination, trouble urinating  Musculoskeletal joint aches, joint swelling, back pain  Integumentary  rashes, skin lesions  Neurological focal weakness, focal numbness, trouble speaking, headaches  Psychiatric depression, anxiety, confusion  Endocrine polyuria, polydipsia, cold intolerance, heat intolerance  Hematologic abnormal bruising, abnormal bleeding, unexplained nose bleeds  Allergic/Immunologic recurrent infections, hives, swollen lymph nodes     Past Medical History:  He,  has a past medical history of Acute respiratory failure with hypoxia ([place]) (07/03/2019), Advanced age (06/21/2018), Anemia, Anxiety, Aortic atherosclerosis ([place]), Benign essential HTN (02/23/2017), Bladder cancer ([place]), BPH (benign prostatic hyperplasia), Cerebral ischemia, CKD (chronic kidney disease), stage III ([place]) (07/03/2019), Current use of long term anticoagulation (04/19/2019), Depressive disorder, Diabetes mellitus type 2, uncontrolled, with complications (45/80/9983), DOE (dyspnea on exertion) (02/23/2017), Dyslipidemia (07/01/2019), First degree AV block (12/14/2017), Former smoker (06/15/2017), History of bladder cancer (02/23/2017), HLD (hyperlipidemia) (07/03/2019), Hyperlipidemia, Hypertension, Orthostatic hypotension (08/31/2010), Paroxysmal atrial fibrillation ([place]) (02/23/2017), Paroxysmal ventricular tachycardia ([place]) (05/19/2020), Plantar fasciitis, Pneumonia due to COVID-19 virus (07/01/2019), Profound sensorineural hearing loss (SNHL) (11/13/2017), Prostate cancer ([place]) (07/03/2019), Sinus bradycardia (02/23/2017), and T2DM (type 2 diabetes mellitus) ([place]) (07/01/2019).   Surgical History:   Past Surgical History:  Procedure Laterality Date   BLADDER SURGERY  2007   Cancerous tumor removed   EYE SURGERY     TONSILLECTOMY        Social History:   reports that he quit smoking about 33 years ago. His smoking use included cigarettes. He has never used smokeless tobacco. He reports that he does not drink alcohol and does not use drugs.   Family History:  His family history is not on file.   Allergies Allergies  Allergen Reactions   Simvastatin Other (See Comments)    Leg pain     Home Medications  Prior to Admission medications   Medication Sig Start Date End Date Taking? Authorizing Provider  acetaminophen (TYLENOL) 650 MG CR tablet Take 650 mg by mouth daily.    [provider]  apixaban (ELIQUIS) 5 MG TABS tablet Take 5 mg by mouth 2 (two) times daily.    [provider]  Brinzolamide-Brimonidine [facility]) 1-0.2 % SUSP Apply 1 drop to eye in the morning, at noon, and at bedtime.    [provider]  busPIRone (BUSPAR) 15 MG tablet Take 15 mg by mouth 2 (two) times daily. 10/18/21   [provider]  dorzolamide-timolol (COSOPT) 22.3-6.8 MG/ML ophthalmic solution Place 1 drop into the right eye 2 (two) times daily. 10/09/21   [provider]  ferrous sulfate 325 (65 FE) MG tablet Take 325 mg by mouth daily with breakfast.    [provider]  finasteride (PROSCAR) 5  MG tablet Take 5 mg by mouth daily.    [provider]  insulin NPH-regular Human (70-30) 100 UNIT/ML injection Inject 30 Units into the skin 2 (two) times daily with a meal. 30 units before meal in AM and 25 units in evening before meal Subcutaneous Patient uses vials    [provider]  lansoprazole (PREVACID) 30 MG capsule Take 30 mg by mouth daily. 01/13/21   [provider]  lisinopril (ZESTRIL) 20 MG tablet TAKE 1 TABLET(20 MG) BY MOUTH DAILY 04/06/21   Park Liter, MD  metFORMIN (GLUCOPHAGE) 1000 MG tablet Take 1,000 mg by mouth 2 (two) times daily with a meal.     [provider]  mirabegron ER (MYRBETRIQ) 50 MG TB24 tablet Take 50 mg by mouth at  bedtime.    [provider]  Multiple Vitamins-Minerals (CENTRUM SILVER ADULT 50+ PO) Take 1 tablet by mouth daily. Unknown strength    [provider]  ondansetron (ZOFRAN) 4 MG tablet Take 4 mg by mouth 2 (two) times daily as needed. 10/28/21   [provider]  PARoxetine (PAXIL) 30 MG tablet Take 30 mg by mouth daily.    [provider]  traZODone (DESYREL) 50 MG tablet Take 50 mg by mouth at bedtime as needed. 10/18/21   [provider]     Critical care time: n/a

## 2021-11-10 NOTE — ED Notes (Signed)
This nurse called to room. Pt confused, agitated, tearful, trying to get OOB, change in status from earlier. Provider paged, receiving nurse notifed

## 2021-11-10 NOTE — H&P (Signed)
Date: 11/10/2021               Patient Name:  David Steele MRN: [id number]  DOB: 25-Mar-1926 Age / Sex: 88 y.o., male   PCP: Ernestene Kiel, MD              Medical Service: Internal Medicine Teaching Service              Attending Physician: Dr. Velna Ochs, MD    First Contact: Neta Upadhyay, MS 3 Pager: [phone]  Second Contact: Dr. Christiana Fuchs Pager: [phone]  Third Contact Dr. Sanjuan Dame Pager: [phone]       After Hours (After 5p/  First Contact Pager: [phone]  weekends / holidays): Second Contact Pager: [phone]   Chief Complaint: Lower GI bleed and hypovolemia  History of Present Illness: David Steele is a 88 y.o. male with a past medical history of CKD 3, paroxysmal A-fib, renal cell carcinoma with laparoscopic right nephrectomy in March 2023, insulin-dependent diabetes mellitus, and diverticulitis presenting to the ED with multiple days of rectal bleeding (first noticed on Saturday) associated with progressive weakness and culminating in an episode of near-syncope overnight.  The patient has significant sensorineural hearing loss and history is largely obtained from grandson and his wife.  Early morning on 6/28 the patient's called his grandson and grandson's wife to help him get off of the toilet.  He was found by his grandson sitting on the toilet with significant amount of fresh blood in the toilet. The patient did not lose consciousness, and asked by the grandson to help him get back into bed because he was feeling very weak.  The grandson called EMS promptly.  Patient endorses noticing blood in his stool as early as Saturday and again on Sunday.  He denies ever having episodes of rectal bleeding before.  She endorses multiple days of lower left quadrant pain and his grandson reports that the patient took some milk of magnesia this past weekend which is unusual for the patient given his high pain tolerance.  He normally gets SOB when walking, but  this is new for the patient.  He has long-term iron deficiency anemia and reports that he has been taking his iron supplements appropriately.  The patient does not recall ever having gotten a colonoscopy before and denies heartburn or recent dysphagia.  The patient endorses some recent constipation.  On arrival to the [facility] ED patient had a hemoglobin of 5.7 and a creatinine of 2.4. He received 1 dose of packed red blood cells and Feiba for Eliquis reversal.  He was later transferred to the [facility] ED due to lack of GI coverage at [facility].  On arrival to [facility], ED his blood pressure was 134/51 heart rate was 61 and oxygen saturation was 100% on room air.  His hemoglobin was 7.2 and when measured again later the same day it was 6.5 with elevated INR of 1.4 and prothrombin time of 17.2.  Potassium was 6.5 and creatinine was 3.5 with a BUN of 58.  GI was consulted and he was started on insulin every 4 hours, pantoprazole, sodium bicarb, Lokelma, and maintenance fluids.  Review of Systems: A complete ROS was negative except as per HPI.  Past medical history:  - CKD 3 - PAF treated with Eliquis - Renal cell carcinoma treated w/ laparoscopic right nephrectomy 07/29/21 - Prostate cancer treated w/ radiation seed implant 10-15 years ago - IDDM - Cochlear implant d/t sensorineural hearing loss  Meds: No  outpatient medications have been marked as taking for the 11/10/21 encounter [facility] Encounter).   Allergies: Allergies as of 11/10/2021 - Review Complete 11/10/2021  Allergen Reaction Noted   Simvastatin Other (See Comments) 03/11/2020   Past Medical History:  Diagnosis Date   Acute respiratory failure with hypoxia ([place]) 07/03/2019   Advanced age 53/10/2018   Anemia    Anxiety    Aortic atherosclerosis (HCC)    Benign essential HTN 02/23/2017   Bladder cancer ([place])    BPH (benign prostatic hyperplasia)    Cerebral ischemia    CKD (chronic kidney disease), stage III ([place])  07/03/2019   Current use of long term anticoagulation 04/19/2019   Depressive disorder    Diabetes mellitus type 2, uncontrolled, with complications 18/29/9371   DOE (dyspnea on exertion) 02/23/2017   Dyslipidemia 07/01/2019   First degree AV block 12/14/2017   Former smoker 06/15/2017   History of bladder cancer 02/23/2017   HLD (hyperlipidemia) 07/03/2019   Hyperlipidemia    Hypertension    Orthostatic hypotension 08/31/2010   Paroxysmal atrial fibrillation ([place]) 02/23/2017   Paroxysmal ventricular tachycardia ([place]) 05/19/2020   Plantar fasciitis    Pneumonia due to COVID-19 virus 07/01/2019   Profound sensorineural hearing loss (SNHL) 11/13/2017   Formatting of this note might be different from the original. Added automatically from request for surgery [id number]   Prostate cancer ([place]) 07/03/2019   Sinus bradycardia 02/23/2017   T2DM (type 2 diabetes mellitus) ([place]) 07/01/2019   Family History: Son and grandson both have diverticulosis/diverticulitis.  Social History: Lives alone and independently completes ADLs and IADLs (patient even mows his own lawn). His daughter has dementia and stays with him often. Patient's son visits him every other day to check in. Patient's grandson and grandson's wife filling in for son after son had recent hernia repair surgery.  Physical Exam: Blood pressure (!) 137/58, pulse 87, temperature (!) 97.4 F (36.3 C), temperature source Axillary, resp. rate 18, height '5\' 11"'$  (1.803 m), weight 77.5 kg, SpO2 100 %.  Constitutional:  Tired-appearing elderly male lying in bed. Alert, but slurring speech and hard of hearing. HEENT:  Pale conjunctivae. Dry mucous membranes. Normocephalic and atraumatic. Hard of hearing bilaterally. Cardiovascular: RRR, no murmurs/rubs/gallops. Normal S1/S2. Respiratory:  Clear to ascultation bilaterally. No rales or crackles. Normal WOB on room air. Abdominal:  Tender to light palpation in LLQ. Soft and non-distended  abdomen. Rectal:  Deferred. Extremities:  Capillary refill 3-4 seconds. Full, spontaneous passive ROM in all 4 extremities. 2+ radial and pedal pulses bilaterally. Skin:  Pale and cool. Psych:  Appropriate.  EKG: Personally reviewed and my interpretation is sinus rhythm without ST elevations or ischemic changes. Prolonged PR interval consistent with hyperkalemia.  US Renal: Impression pending  Assessment & Plan by Problem: Principal Problem:   Diverticulosis of colon with hemorrhage Active Problems:   T2DM (type 2 diabetes mellitus) (HCC)   CKD (chronic kidney disease), stage III (HCC)   Paroxysmal atrial fibrillation (HCC)   Anemia  Lower GI bleed in setting of diverticulitis Symptomatic anemia 2/2 lower GI bleed Patient s/p 1 unit PRBC and currently getting fluid replacement therapy. Patient is borderline hypothermic with recent history of constipation and with LLQ tenderness on exam, which is consistent with a lower GI bleed caused by diverticulosis. The patient's family history as well as insidious onset of symptoms with dyspnea, dizziness, rectal bleeding further supports this etiology. Given that use of imaging with contrast is limited by his CKD, colonoscopy has been recommended by  GI to evaluate for lower GI bleed. Antibiotics can be started for empiric diverticulosis treatment. The patient's acidosis is consistent with fluid and electrolyte shifts due to hypovolemia. Considering the patient's physical exam signs of hypovolemia, it is important to monitor for further signs of hemorrhagic hypovolemic shock. - Eliquis reversed in ED with Feiba - Maintenance NS IV - Transfuse 2 units PRBC on admission - Zosyn for diverticulosis empiric treatment - CMP tomorrow and daily BMP after - Obtain CBC 1700 6/28 and in AM on 6/29 - GI consulted, appreciate initial recommendations, awaiting discussion with attending Dr. Henrene Pastor  Hyperkalemia Patient presented with acute hyperkalemia in setting  of acute GI bleed and CKD. His hypovolemia and acidosis are likely contributing to this electrolyte abnormality. Patient is s/p 1 unit calcium gluconate, Lokelma, insulin, and started on bicarb drip. - Monitor electrolytes Q4 until status improves - Continue fluid resuscitation - Continue Lokelma, bicarb, and insulin drip depending on metabolic panel - Repeat EKG and administer calcium gluconate  CKD 3 Creatinine clearance calculated using Cockcroft-Gault calculator to adjust for age is 14 mL/min. Patient has underlying kidney dieasese and presents with hypovolemia in setting of chronic GI bleed. He now has AKI in setting of significant electrolyte disturbances. Nephrology consulted in ED and will continue to reassess kideny function and electrolytes daily. - Daily weights - Kidney US - Strict I/Os - Obtain daily magnesium - Nephrology consulted, appreciate these recommendations:  - Continue supportive care with IV fluid - Acidosis and hyperkalemia management as below - Continue to monitor daily Cr, Dose meds for GFR - Monitor Daily I/Os, daily weight - Maintain MAP >65 for optimal renal perfusion.  - Avoid nephrotoxic medications including NSAIDs - Use synthetic opioids (Fentanyl/Dilaudid) if needed - Holding on dialysis for now.  Given the patient's age she is a poor dialysis candidate.  We will try to discuss with his family - Hold home lisinopril  Paroxysmal A-fib EKG on admission does not show active A-fib at this time. Eliquis taken outpatient for thromboembolism prophylaxis. Will hold Eliquis in setting of lower GI bleed. Will hold other HTN and rate control medications (I.e. home lisinopril) due to hypovolemia and will reassess restarting these medications in the near future. Based on a CHA2DS2-VASc Score of 5, the patient has a 7.2% stroke risk per year. - Continue continuous cardiac monitoring - Resume anticoagulation upon discharge when bleed is stopped  Insulin-dependent  diabetes mellitus - Q4 CBG monitoring - Sliding scale insulin coverage as needed - Goal CBG: 140-180 while acutely ill and aim for euglycemia once bleeding resolves  DVT prophx: SCDs (pharmacological anticoagulation deferred due to bleed) Diet: Thin fluids Bowel: PRN Code: Full  Prior to Admission Living Arrangement: Home Anticipated Discharge Location: Home Barriers to Discharge: Medical workup, active bleed  Dispo: Admit patient to Inpatient with expected length of stay greater than 2 midnights.  SignedKaty Fitch, Vergia Chea, Medical Student 11/10/2021, 2:27 PM  Pager: [phone]    Attestation for Student Documentation:  I personally was present and performed or re-performed the history, physical exam and medical decision-making activities of this service and have verified that the service and findings are accurately documented in the student's note.  Marianna Payment, MD 11/11/2021, 1:44 PM\

## 2021-11-10 NOTE — Progress Notes (Signed)
Pt transferred from ED and he was confused and agitated. He was fighting with staff when we tried to put tele monitor on him. Unable to check his vital because he was fighting against. Paged MD to order IV med for agitation.

## 2021-11-10 NOTE — Progress Notes (Signed)
Brief Nephrology Note  Stat cmp placed around 12:30. Order placed again for now. Awaiting collection and results to guide care.

## 2021-11-10 NOTE — ED Provider Notes (Signed)
[facility] EMERGENCY DEPARTMENT Provider Note   CSN: [id number] Arrival date & time: 11/10/21  8101     History  Chief Complaint  Patient presents with   GI Problem    David Steele is a 88 y.o. male.  Patient transferred from outside facility with GI bleed.  Has been having bright red blood per rectum for the past 3 to 4 days unknown amounts.  Found to have a hemoglobin of 5 and was transfused 1 unit of blood and given Feiba for reversal of his Eliquis.  Patient complains of some abdominal pain.  No vomiting.  No chest pain or shortness of breath. Patient states he fell in the bathroom but did not sustain any injuries.  He is oriented to person and place.  Past medical history includes hypertension, diabetes, bladder cancer, atrial fibrillation on Eliquis.  He was transferred for GI and possible interventional radiology evaluation.  Limited transfer documents are available. Family ports patient is very independent and still drives.  He does not have an advanced directive or goals of care.  He states he would want life support if necessary. He states he did not hurt himself when he fell in the bathroom.  He reports sliding to the floor from the commode and calling for help.  Denies hitting head.  The history is provided by the patient and the EMS personnel. The history is limited by the condition of the patient.  GI Problem       Home Medications Prior to Admission medications   Medication Sig Start Date End Date Taking? Authorizing Provider  acetaminophen (TYLENOL) 650 MG CR tablet Take 650 mg by mouth daily.    [provider]  apixaban (ELIQUIS) 5 MG TABS tablet Take 5 mg by mouth 2 (two) times daily.    [provider]  Brinzolamide-Brimonidine [facility]) 1-0.2 % SUSP Apply 1 drop to eye in the morning, at noon, and at bedtime.    [provider]  ferrous sulfate 325 (65 FE) MG tablet Take 325 mg by mouth daily with  breakfast.    [provider]  finasteride (PROSCAR) 5 MG tablet Take 5 mg by mouth daily.    [provider]  insulin NPH-regular Human (70-30) 100 UNIT/ML injection Inject 30 Units into the skin 2 (two) times daily with a meal. 30 units before meal in AM and 25 units in evening before meal Subcutaneous Patient uses vials    [provider]  lansoprazole (PREVACID) 30 MG capsule Take 30 mg by mouth daily. 01/13/21   [provider]  lisinopril (ZESTRIL) 20 MG tablet TAKE 1 TABLET(20 MG) BY MOUTH DAILY 04/06/21   Park Liter, MD  metFORMIN (GLUCOPHAGE) 1000 MG tablet Take 1,000 mg by mouth 2 (two) times daily with a meal.     [provider]  mirabegron ER (MYRBETRIQ) 50 MG TB24 tablet Take 50 mg by mouth at bedtime.    [provider]  Multiple Vitamins-Minerals (CENTRUM SILVER ADULT 50+ PO) Take 1 tablet by mouth daily. Unknown strength    [provider]  PARoxetine (PAXIL) 30 MG tablet Take 30 mg by mouth daily.    [provider]      Allergies    Simvastatin    Review of Systems   Review of Systems  Unable to perform ROS: Acuity of condition    Physical Exam Updated Vital Signs BP (!) 131/57   Pulse 67   Temp (!) 97.4 F (36.3  C)   Resp 16   Ht '5\' 11"'$  (1.803 m)   Wt 77.5 kg   SpO2 100%   BMI 23.84 kg/m  Physical Exam Vitals and nursing note reviewed.  Constitutional:      General: He is not in acute distress.    Appearance: He is well-developed.     Comments: Pale appearing, chronically ill  HENT:     Head: Normocephalic and atraumatic.     Mouth/Throat:     Pharynx: No oropharyngeal exudate.  Eyes:     Conjunctiva/sclera: Conjunctivae normal.     Pupils: Pupils are equal, round, and reactive to light.  Neck:     Comments: No meningismus. Cardiovascular:     Rate and Rhythm: Normal rate and regular rhythm.     Heart sounds: Normal heart sounds. No murmur heard. Pulmonary:     Effort:  Pulmonary effort is normal. No respiratory distress.     Breath sounds: Normal breath sounds.  Abdominal:     Palpations: Abdomen is soft.     Tenderness: There is abdominal tenderness. There is no guarding or rebound.     Comments: Diffuse tenderness with guarding throughout  Genitourinary:    Comments: BRB in diaper Musculoskeletal:        General: No tenderness. Normal range of motion.     Cervical back: Normal range of motion and neck supple.  Skin:    General: Skin is warm.  Neurological:     Mental Status: He is alert and oriented to person, place, and time.     Cranial Nerves: No cranial nerve deficit.     Motor: No abnormal muscle tone.     Coordination: Coordination normal.     Comments:  5/5 strength throughout. CN 2-12 intact.Equal grip strength.   Psychiatric:        Behavior: Behavior normal.     ED Results / Procedures / Treatments   Labs (all labs ordered are listed, but only abnormal results are displayed) Labs Reviewed  CBC WITH DIFFERENTIAL/PLATELET - Abnormal; Notable for the following components:      Result Value   WBC 12.9 (*)    RBC 2.53 (*)    Hemoglobin 7.2 (*)    HCT 22.6 (*)    RDW 16.9 (*)    Neutro Abs 10.9 (*)    Abs Immature Granulocytes 0.35 (*)    All other components within normal limits  PROTIME-INR - Abnormal; Notable for the following components:   Prothrombin Time 17.2 (*)    INR 1.4 (*)    All other components within normal limits  I-STAT CHEM 8, ED - Abnormal; Notable for the following components:   Sodium 133 (*)    Potassium 6.5 (*)    BUN 58 (*)    Creatinine, Ser 3.50 (*)    Glucose, Bld 281 (*)    Calcium, Ion 1.12 (*)    TCO2 12 (*)    Hemoglobin 6.5 (*)    HCT 19.0 (*)    All other components within normal limits  COMPREHENSIVE METABOLIC PANEL  LACTIC ACID, PLASMA  LIPASE, BLOOD  CBC  HEMOGLOBIN A1C  TYPE AND SCREEN  PREPARE RBC (CROSSMATCH)    EKG EKG Interpretation  Date/Time:  Wednesday November 10 2021  08:19:33 EDT Ventricular Rate:  60 PR Interval:  270 QRS Duration: 88 QT Interval:  449 QTC Calculation: 449 R Axis:   39 Text Interpretation: Sinus rhythm Prolonged PR interval No previous ECGs available Confirmed by Ezequiel Essex (  [id number]) on 11/10/2021 8:50:23 AM  Radiology No results found.  Procedures .Critical Care  Performed by: Ezequiel Essex, MD Authorized by: Ezequiel Essex, MD   Critical care provider statement:    Critical care time (minutes):  60   Critical care time was exclusive of:  Separately billable procedures and treating other patients   Critical care was necessary to treat or prevent imminent or life-threatening deterioration of the following conditions:  Endocrine crisis and renal failure (GI  bleed)   Critical care was time spent personally by me on the following activities:  Development of treatment plan with patient or surrogate, discussions with consultants, evaluation of patient's response to treatment, examination of patient, ordering and review of laboratory studies, ordering and review of radiographic studies, ordering and performing treatments and interventions, pulse oximetry, re-evaluation of patient's condition and review of old charts   I assumed direction of critical care for this patient from another provider in my specialty: no     Care discussed with: admitting provider       Medications Ordered in ED Medications  sodium chloride 0.9 % bolus 1,000 mL (has no administration in time range)    And  0.9 %  sodium chloride infusion (has no administration in time range)  pantoprazole (PROTONIX) 80 mg /NS 100 mL IVPB (has no administration in time range)  pantoprozole (PROTONIX) 80 mg /NS 100 mL infusion (has no administration in time range)  0.9 %  sodium chloride infusion (has no administration in time range)    ED Course/ Medical Decision Making/ A&P                           Medical Decision Making Amount and/or Complexity of Data  Reviewed Independent Historian: EMS External Data Reviewed: labs, radiology, ECG and notes.    Details: [facility] ED record Labs: ordered. Radiology: ordered and independent interpretation performed. Decision-making details documented in ED Course. ECG/medicine tests: ordered and independent interpretation performed. Decision-making details documented in ED Course.  Risk OTC drugs. Prescription drug management. Decision regarding hospitalization.  Patient transferred with GI bleed from outside facility without GI coverage.  On arrival patient is awake and alert but oriented x2.  Blood pressure 175 systolic.  He was given 1 unit of blood and FEIBA prior to transfer.  He is complaining of some abdominal pain.  Labs at outside facility showed white blood cell count 13.4, hemoglobin 5.7, platelets 244, INR 1.1, potassium 5.5, creatinine 2.4, lactate 6.4  Patient was given 1 unit of blood and FEIBA prior to transfer.  Additional units of packed red blood cells are ordered on arrival.  D/w PA-C G[place]bin of GI.  They will evaluate.  Agree not a good candidate for CT angio given renal failure.  r/O diverticular bleed, neoplasia, vascular malformation, colitis, radiation proctitis  On arrival i-STAT shows potassium of 6.5, bicarb of 12, creatinine 3.5, hemoglobin 6.5.  We will give calcium, bicarb, insulin, Lokelma, discussed with nephrology.  Elevated creatinine precludes CT angiogram as well as likely any IR intervention.  Blood pressure and heart rate remained stable.  Abdomen soft without peritoneal signs.  Discussed with family and grandson at bedside.  Patient does not have an advanced directive or goals of care.  He would want to have everything done as far as they know.  Patient is agreeable to EGD and colonoscopy as necessary.  D/w Dr. Joylene Grapes of nephrology.  He agrees with medical treatment for hyperkalemia with  calcium, bicarb, insulin and Lokelma.  Does agree with bicarb  infusion.  Discussed with critical care team and Dr. Tamala Julian who have seen patient.  They feel he is stable for the floor at this time.  Agree with hyperkalemia treatment, GI evaluation and blood transfusion  Patient and family updated.  Continue bicarb drip, calcium, insulin, Lokelma.  Clear liquid diet approved by gastroenterology. Admission discussed with internal medicine residents.       Final Clinical Impression(s) / ED Diagnoses Final diagnoses:  Rectal bleeding  Hyperkalemia  Acute renal failure superimposed on chronic kidney disease, unspecified CKD stage, unspecified acute renal failure type [facility])    Rx / DC Orders ED Discharge Orders     None         Ezequiel Essex, MD 11/10/21 1105

## 2021-11-11 ENCOUNTER — Inpatient Hospital Stay (HOSPITAL_COMMUNITY): Payer: Medicare Other

## 2021-11-11 DIAGNOSIS — I9589 Other hypotension: Secondary | ICD-10-CM

## 2021-11-11 DIAGNOSIS — R7989 Other specified abnormal findings of blood chemistry: Secondary | ICD-10-CM | POA: Diagnosis not present

## 2021-11-11 DIAGNOSIS — K5731 Diverticulosis of large intestine without perforation or abscess with bleeding: Secondary | ICD-10-CM | POA: Diagnosis not present

## 2021-11-11 DIAGNOSIS — E875 Hyperkalemia: Secondary | ICD-10-CM

## 2021-11-11 DIAGNOSIS — N179 Acute kidney failure, unspecified: Secondary | ICD-10-CM

## 2021-11-11 DIAGNOSIS — E1122 Type 2 diabetes mellitus with diabetic chronic kidney disease: Secondary | ICD-10-CM

## 2021-11-11 DIAGNOSIS — N189 Chronic kidney disease, unspecified: Secondary | ICD-10-CM

## 2021-11-11 DIAGNOSIS — Z794 Long term (current) use of insulin: Secondary | ICD-10-CM

## 2021-11-11 DIAGNOSIS — N1832 Chronic kidney disease, stage 3b: Secondary | ICD-10-CM

## 2021-11-11 DIAGNOSIS — K7200 Acute and subacute hepatic failure without coma: Secondary | ICD-10-CM

## 2021-11-11 DIAGNOSIS — D62 Acute posthemorrhagic anemia: Secondary | ICD-10-CM | POA: Diagnosis not present

## 2021-11-11 DIAGNOSIS — D649 Anemia, unspecified: Secondary | ICD-10-CM

## 2021-11-11 DIAGNOSIS — E861 Hypovolemia: Secondary | ICD-10-CM

## 2021-11-11 HISTORY — PX: IR US GUIDE VASC ACCESS RIGHT: IMG2390

## 2021-11-11 HISTORY — PX: IR ANGIOGRAM VISCERAL SELECTIVE: IMG657

## 2021-11-11 HISTORY — PX: IR ANGIOGRAM SELECTIVE EACH ADDITIONAL VESSEL: IMG667

## 2021-11-11 LAB — URINALYSIS, ROUTINE W REFLEX MICROSCOPIC
Bilirubin Urine: NEGATIVE
Glucose, UA: 50 mg/dL — AB
Ketones, ur: NEGATIVE mg/dL
Leukocytes,Ua: NEGATIVE
Nitrite: NEGATIVE
Protein, ur: NEGATIVE mg/dL
Specific Gravity, Urine: 1.014 (ref 1.005–1.030)
pH: 5 (ref 5.0–8.0)

## 2021-11-11 LAB — CBC
HCT: 21.5 % — ABNORMAL LOW (ref 39.0–52.0)
HCT: 23.6 % — ABNORMAL LOW (ref 39.0–52.0)
Hemoglobin: 7.4 g/dL — ABNORMAL LOW (ref 13.0–17.0)
Hemoglobin: 7.8 g/dL — ABNORMAL LOW (ref 13.0–17.0)
MCH: 28.6 pg (ref 26.0–34.0)
MCH: 28.3 pg (ref 26.0–34.0)
MCHC: 34.4 g/dL (ref 30.0–36.0)
MCHC: 33.1 g/dL (ref 30.0–36.0)
MCV: 83 fL (ref 80.0–100.0)
MCV: 85.5 fL (ref 80.0–100.0)
Platelets: 190 10*3/uL (ref 150–400)
Platelets: 167 10*3/uL (ref 150–400)
RBC: 2.59 MIL/uL — ABNORMAL LOW (ref 4.22–5.81)
RBC: 2.76 MIL/uL — ABNORMAL LOW (ref 4.22–5.81)
RDW: 16.6 % — ABNORMAL HIGH (ref 11.5–15.5)
RDW: 16.2 % — ABNORMAL HIGH (ref 11.5–15.5)
WBC: 11.2 10*3/uL — ABNORMAL HIGH (ref 4.0–10.5)
WBC: 12.5 10*3/uL — ABNORMAL HIGH (ref 4.0–10.5)
nRBC: 0.4 % — ABNORMAL HIGH (ref 0.0–0.2)
nRBC: 0.3 % — ABNORMAL HIGH (ref 0.0–0.2)

## 2021-11-11 LAB — COMPREHENSIVE METABOLIC PANEL
ALT: 1273 U/L — ABNORMAL HIGH (ref 0–44)
AST: 1624 U/L — ABNORMAL HIGH (ref 15–41)
Albumin: 2.2 g/dL — ABNORMAL LOW (ref 3.5–5.0)
Alkaline Phosphatase: 143 U/L — ABNORMAL HIGH (ref 38–126)
Anion gap: 13 (ref 5–15)
BUN: 67 mg/dL — ABNORMAL HIGH (ref 8–23)
CO2: 16 mmol/L — ABNORMAL LOW (ref 22–32)
Calcium: 7.7 mg/dL — ABNORMAL LOW (ref 8.9–10.3)
Chloride: 110 mmol/L (ref 98–111)
Creatinine, Ser: 3.26 mg/dL — ABNORMAL HIGH (ref 0.61–1.24)
GFR, Estimated: 17 mL/min — ABNORMAL LOW (ref >60)
Glucose, Bld: 337 mg/dL — ABNORMAL HIGH (ref 70–99)
Potassium: 4 mmol/L (ref 3.5–5.1)
Sodium: 139 mmol/L (ref 135–145)
Total Bilirubin: 1.4 mg/dL — ABNORMAL HIGH (ref 0.3–1.2)
Total Protein: 4.2 g/dL — ABNORMAL LOW (ref 6.5–8.1)

## 2021-11-11 LAB — PREPARE RBC (CROSSMATCH)

## 2021-11-11 LAB — GLUCOSE, CAPILLARY
Glucose-Capillary: 289 mg/dL — ABNORMAL HIGH (ref 70–99)
Glucose-Capillary: 316 mg/dL — ABNORMAL HIGH (ref 70–99)
Glucose-Capillary: 311 mg/dL — ABNORMAL HIGH (ref 70–99)
Glucose-Capillary: 283 mg/dL — ABNORMAL HIGH (ref 70–99)

## 2021-11-11 LAB — HEMOGLOBIN A1C
Hgb A1c MFr Bld: 7.5 % — ABNORMAL HIGH (ref 4.8–5.6)
Mean Plasma Glucose: 169 mg/dL

## 2021-11-11 MED ORDER — LACTATED RINGERS IV BOLUS
1000.0000 mL | Freq: Once | INTRAVENOUS | Status: AC
  Administered 2021-11-11: 1000 mL via INTRAVENOUS

## 2021-11-11 MED ORDER — MIDAZOLAM HCL 2 MG/2ML IJ SOLN
INTRAMUSCULAR | Status: AC | PRN
  Administered 2021-11-11 (×2): .5 mg via INTRAVENOUS

## 2021-11-11 MED ORDER — LACTATED RINGERS IV BOLUS: 1000.0000 mL | Freq: Once | INTRAVENOUS | Status: AC

## 2021-11-11 MED ORDER — SODIUM CHLORIDE 0.9% IV SOLUTION: Freq: Once | INTRAVENOUS | Status: AC

## 2021-11-11 MED ORDER — LIDOCAINE HCL 1 % IJ SOLN
INTRAMUSCULAR | Status: AC | PRN
  Administered 2021-11-11: 10 mL via INTRADERMAL

## 2021-11-11 MED ORDER — FENTANYL CITRATE (PF) 100 MCG/2ML IJ SOLN
INTRAMUSCULAR | Status: AC
  Filled 2021-11-11: qty 2

## 2021-11-11 MED ORDER — IOHEXOL 300 MG/ML  SOLN
100.0000 mL | Freq: Once | INTRAMUSCULAR | Status: AC | PRN
Start: 2021-11-11 — End: 2021-11-11
  Administered 2021-11-11: 40 mL via INTRA_ARTERIAL

## 2021-11-11 MED ORDER — MIDAZOLAM HCL 2 MG/2ML IJ SOLN
INTRAMUSCULAR | Status: AC
  Filled 2021-11-11: qty 2

## 2021-11-11 MED ORDER — FENTANYL CITRATE (PF) 100 MCG/2ML IJ SOLN
INTRAMUSCULAR | Status: AC | PRN
  Administered 2021-11-11 (×2): 25 ug via INTRAVENOUS

## 2021-11-11 MED ORDER — INSULIN ASPART 100 UNIT/ML IJ SOLN
0.0000 [IU] | INTRAMUSCULAR | Status: DC
  Administered 2021-11-11 (×2): 7 [IU] via SUBCUTANEOUS
  Administered 2021-11-11: 5 [IU] via SUBCUTANEOUS
  Administered 2021-11-12: 2 [IU] via SUBCUTANEOUS
  Administered 2021-11-12: 3 [IU] via SUBCUTANEOUS
  Administered 2021-11-12: 5 [IU] via SUBCUTANEOUS

## 2021-11-11 MED ORDER — SODIUM CHLORIDE 0.9% IV SOLUTION
Freq: Once | INTRAVENOUS | Status: AC
Start: 2021-11-11 — End: 2021-11-11

## 2021-11-11 MED ORDER — CHLORHEXIDINE GLUCONATE CLOTH 2 % EX PADS
6.0000 | MEDICATED_PAD | Freq: Every day | CUTANEOUS | Status: DC
  Administered 2021-11-11 – 2021-11-15 (×5): 6 via TOPICAL

## 2021-11-11 MED ORDER — LIDOCAINE HCL 1 % IJ SOLN
INTRAMUSCULAR | Status: AC
  Filled 2021-11-11: qty 20

## 2021-11-11 MED ORDER — SODIUM BICARBONATE 650 MG PO TABS
1300.0000 mg | ORAL_TABLET | Freq: Two times a day (BID) | ORAL | Status: DC
  Administered 2021-11-12: 1300 mg via ORAL
  Filled 2021-11-11 (×4): qty 2

## 2021-11-11 NOTE — Sedation Documentation (Signed)
6 fr Celt deployed to right femoral site at 1637. Dressing of gauze and tape applied. No hematoma at site.

## 2021-11-11 NOTE — TOC Initial Note (Signed)
Transition of Care [facility]) - Initial/Assessment Note    Patient Details  Name: David Steele MRN: [id number] Date of Birth: [birth year]  Transition of Care [facility]) CM/SW Contact:    Verdell Carmine, RN Phone Number: 11/11/2021, 4:34 PM  Clinical Narrative:                  88 year old patient admitted for GI bleed. Patient is normally independent, drives a car, etc. Daughter who has dementia visits him often  and grandchildren visit him as well. HBG was 5.7  four units of blood given, patient  has now increased agitation, confusion. Transferred to [facility] for closer monitoring and post angiogram femoral stick.    Barriers to Discharge: Continued Medical Work up   Patient Goals and CMS Choice        Expected Discharge Plan and Services      Disposition congruent on mental status and hemostasis.Patient is a DNR,previously did not have a advanced directive                                          Prior Living Arrangements/Services     Patient language and need for interpreter reviewed:: Yes        Need for Family Participation in Patient Care: Yes (Comment) Care giver support system in place?: Yes (comment)   Criminal Activity/Legal Involvement Pertinent to Current Situation/Hospitalization: No - Comment as needed  Activities of Daily Living      Permission Sought/Granted                  Emotional Assessment       Orientation: : Fluctuating Orientation (Suspected and/or reported Sundowners) Alcohol / Substance Use: Not Applicable Psych Involvement: No (comment)  Admission diagnosis:  Diverticulosis of colon with hemorrhage [K57.31] Hyperkalemia [E87.5] Rectal bleeding [K62.5] Acute renal failure superimposed on chronic kidney disease, unspecified CKD stage, unspecified acute renal failure type ([place]) [N17.9, N18.9] Patient Active Problem List   Diagnosis Date Noted   Acute renal failure superimposed on chronic kidney disease ([place])    Ischemic  hepatitis    Diverticulosis of colon with hemorrhage 11/10/2021   Rectal bleeding    Acute blood loss anemia    Chronic anticoagulation    Preop cardiovascular exam 07/05/2021   Nonsustained ventricular tachycardia ([place]) 05/19/2020   Hyperlipidemia    Depressive disorder    Cerebral ischemia    BPH (benign prostatic hyperplasia)    Bladder cancer ([place])    Anxiety    Anemia    Aortic atherosclerosis ([place])    Acute respiratory failure with hypoxia ([place]) 07/03/2019   Diabetes mellitus type 2, uncontrolled, with complications 13/24/4010   Prostate cancer ([place]) 07/03/2019   CKD (chronic kidney disease), stage III ([place]) 07/03/2019   HLD (hyperlipidemia) 07/03/2019   Pneumonia due to COVID-19 virus 07/01/2019   T2DM (type 2 diabetes mellitus) ([place]) 07/01/2019   Unspecified hearing loss, unspecified ear 07/01/2019   COVID-19 07/01/2019   Current use of long term anticoagulation 04/19/2019   Advanced age 88/10/2018   First degree AV block 12/14/2017   Profound sensorineural hearing loss (SNHL) 11/13/2017   Former smoker 06/15/2017   Benign essential HTN 02/23/2017   DOE (dyspnea on exertion) 02/23/2017   History of bladder cancer 02/23/2017   Light headed 02/23/2017   Other fatigue 02/23/2017   Paroxysmal atrial fibrillation ([place]) 02/23/2017   Sinus  bradycardia 02/23/2017   Near syncope 02/23/2017   Hypertension 08/31/2010   Orthostatic hypotension 08/31/2010   PCP:  Ernestene Kiel, MD Pharmacy:   [facility] [place], [place] AT [place] [address] [place] [address] Phone: [phone] Fax: [phone]     Social Determinants of Health (SDOH) Interventions    Readmission Risk Interventions     No data to display

## 2021-11-11 NOTE — Progress Notes (Addendum)
Nephrology Follow-Up Consult note   Assessment/Recommendations: David Steele is a/an 88 y.o. male with a past medical history significant for HTN, HLD, DM2, afib who present w/ GI bleeding c/b AKI and hyperkalemia   AKI on CKD 3b: Unclear UOP. BL Crt at least 1.8 after nephrectomy but baseline may be even higher; no recent outpatient labs.  Creatinine was 2.4 at the outside hospital before transfer.  Creatinine stable at 3.3 today.  Most likely some degree of ATN related to anemia and possible hypotension  -Continue supportive care with IV fluid -Acidosis and hyperkalemia management as below -Continue to monitor daily Cr, Dose meds for GFR -Monitor Daily I/Os, Daily weight  -Maintain MAP>65 for optimal renal perfusion.  -Avoid nephrotoxic medications including NSAIDs -Use synthetic opioids (Fentanyl/Dilaudid) if needed -Discussed with family patient is not a dialysis candidate -Hold home lisinopril   GI bleeding: GI involved.  Likely diverticular bleed on anticoagulation.  Currently supportive measures and likely need colonoscopy at some point.  Continue transfusions as needed   Hyperkalemia: Now resolved with medical treatment   Metabolic acidosis: Likely anion gap and non-anion gap.  Improved bicarb is 17 today.  Oral bicarbonate 1300 mg twice daily   Hyponatremia: Sodium now normal   Atrial fibrillation: Holding home medications   History of RCC: Status post right nephrectomy in March 2023   Uncontrolled type 2 diabetes with hyperglycemia: Management per primary team  Elevation of LFTs: AST/ALT increasing. Possibly some degree of shock. MGMT per primary  Delirium: multifactorial. Mgmt per primary     Recommendations conveyed to primary service.    [facility] 11/11/2021 10:35 AM  ___________________________________________________________  CC: AKI, GI bleeding  Interval History/Subjective: Patient delirious and agitated last night.   Ongoing concerns for GI bleeding.  Minimally interactive this morning and very agitated.  Discussed with son at bedside.  Patient is not a dialysis candidate.  Fortunately does not need dialysis at this time.   Medications:  Current Facility-Administered Medications  Medication Dose Route Frequency Provider Last Rate Last Admin   hydrOXYzine (VISTARIL) injection 25 mg  25 mg Intramuscular Once Gaylan Gerold, DO       insulin aspart (novoLOG) injection 0-9 Units  0-9 Units Subcutaneous Q4H Masters, Katie, DO       pantoprazole (PROTONIX) injection 40 mg  40 mg Intravenous Q12H Candee Furbish, MD   40 mg at 11/10/21 2119   piperacillin-tazobactam (ZOSYN) IVPB 2.25 g  2.25 g Intravenous Q6H Velna Ochs, MD 100 mL/hr at 11/11/21 0619 2.25 g at 11/11/21 9563   sodium bicarbonate tablet 1,300 mg  1,300 mg Oral BID Reesa Chew, MD       sodium chloride flush (NS) 0.9 % injection 3 mL  3 mL Intravenous Q12H Marianna Payment, MD   3 mL at 11/11/21 8756      Review of Systems: Unable to obtain due to the patient's AMS  Physical Exam: Vitals:   11/11/21 0410 11/11/21 1027  BP: 133/87 (!) 96/58  Pulse: 86 90  Resp: 18 16  Temp: 98 F (36.7 C) 98 F (36.7 C)  SpO2: 100% 93%   No intake/output data recorded.  Intake/Output Summary (Last 24 hours) at 11/11/2021 1035 Last data filed at 11/11/2021 0400 Gross per 24 hour  Intake 2240.9 ml  Output 75 ml  Net 2165.9 ml   Constitutional: Ill-appearing, agitated, lying in bed ENMT: ears and nose without scars or lesions, MMM CV: normal rate, no edema Respiratory: Bilateral  chest rise, normal work of breathing Gastrointestinal: soft, non-tender, no palpable masses or hernias Skin: no visible lesions or rashes Psych: Tired, intermittently interactive, difficulty hearing, agitated   Test Results I personally reviewed new and old clinical labs and radiology tests Lab Results  Component Value Date   NA 139 11/11/2021   K 4.0  11/11/2021   CL 110 11/11/2021   CO2 16 (L) 11/11/2021   BUN 67 (H) 11/11/2021   CREATININE 3.26 (H) 11/11/2021   GFR 82.39 09/26/2011   CALCIUM 7.7 (L) 11/11/2021   ALBUMIN 2.2 (L) 11/11/2021   PHOS 1.9 (L) 07/06/2019    CBC Recent Labs  Lab 11/10/21 0925 11/10/21 0935 11/11/21 0903  WBC 12.9*  --  11.2*  NEUTROABS 10.9*  --   --   HGB 7.2* 6.5* 7.4*  HCT 22.6* 19.0* 21.5*  MCV 89.3  --  83.0  PLT 221  --  190

## 2021-11-11 NOTE — Progress Notes (Addendum)
Subjective:  David Steele is a 88 y.o. male with a past medical history of CKD3, atherosclerosis, renal cell carcinoma s/p R nephrectomy, IDDM, and A-fib on anticoagulation at home admitted for a glower GI bleed likely secondary to diverticulosis.  Yesterday evening, the patient became more agitated and confused, reportedly resisting vitals and being uncooperative with nursing team after transfer to inpatient unit. He was given buspirone and hydroxyzine at the time. This morning, the patient was refusing labs until his grandson returned and was able to assist the phlebotomist in convincing the patient labs were necessary. The patient's grandson states that the patient appears more confused and abnormally uncooperative this morning. The patient has had at least 3 episodes of new blood found on the patient's sheets this morning. The patient states he is in pain when asked, but is unclear about the source of the pain. He also mentions feeling cold. Communication with the patient continues to be limited by his hearing impairment and moderate level of confusion this morning.  Objective:  Vital signs in last 24 hours: Vitals:   11/10/21 2242 11/10/21 2300 11/11/21 0059 11/11/21 0410  BP: (!) 141/51 (!) 135/96 (!) 143/58 133/87  Pulse: 80 83 81 86  Resp: '19 17 17 18  '$ Temp: 98.5 F (36.9 C)  97.9 F (36.6 C) 98 F (36.7 C)  TempSrc: Axillary  Axillary Axillary  SpO2: 95% 100% 99% 100%  Weight:    85.2 kg  Height:       Weight change:   Intake/Output Summary (Last 24 hours) at 11/11/2021 1016 Last data filed at 11/11/2021 0400 Gross per 24 hour  Intake 2240.9 ml  Output 75 ml  Net 2165.9 ml   General:  Agitated, mildly confused. In bed, huddled under covers. HEENT:  Mildly pale conjunctiva. Moist mucous membranes. Cardiovascular:  RRR, no murmurs/rubs/gallops. Normal S1/S2. Respiratory:  Lungs clear bilaterally to ascultation. No rales or crackles. Normal WOB on room air. Abdominal:   Soft, nondistended. Tender to palpation (difficult to assess due to patient's agitation, but appears to react negatively to palpation). Rectal: 1 small hemorrhoid observed near external rectal opening, not ulcerated or bleeding. Significant new dried blood on skin surrounding rectum and lower back. Extremities:  No edema or cyanosis. Capillary refill <2 seconds. Skin:  Pale, cool. Psych:  Confused, appears to think he has not been taken to the hospital yet. Not fully oriented to place and time.     Latest Ref Rng & Units 11/11/2021    9:03 AM 11/10/2021    9:35 AM 11/10/2021    9:25 AM  CBC  WBC 4.0 - 10.5 K/uL 11.2   12.9   Hemoglobin 13.0 - 17.0 g/dL 7.4  6.5  7.2   Hematocrit 39.0 - 52.0 % 21.5  19.0  22.6   Platelets 150 - 400 K/uL 190   221     CMP     Component Value Date/Time   NA 139 11/11/2021 0903   K 4.0 11/11/2021 0903   CL 110 11/11/2021 0903   CO2 16 (L) 11/11/2021 0903   GLUCOSE 337 (H) 11/11/2021 0903   BUN 67 (H) 11/11/2021 0903   CREATININE 3.26 (H) 11/11/2021 0903   CALCIUM 7.7 (L) 11/11/2021 0903   PROT 4.2 (L) 11/11/2021 0903   ALBUMIN 2.2 (L) 11/11/2021 0903   AST 1,624 (H) 11/11/2021 0903   ALT 1,273 (H) 11/11/2021 0903   ALKPHOS 143 (H) 11/11/2021 0903   BILITOT 1.4 (H) 11/11/2021 5945   GFRNONAA  17 (L) 11/11/2021 0903   GFRAA >60 07/06/2019 0010     Imaging:   Renal US radiology impression:  1. Status post right nephrectomy. No sonographic abnormality in the  nephrectomy bed.  2. Multiple left renal cysts are again seen.  3. Enlarged prostate.  Assessment/Plan:  Principal Problem:   Diverticulosis of colon with hemorrhage Active Problems:   T2DM (type 2 diabetes mellitus) ([place])   CKD (chronic kidney disease), stage III (HCC)   Paroxysmal atrial fibrillation ([place])   Anemia  David Steele is a 88 y.o. male with a past medical history of CKD3, atherosclerosis, renal cell carcinoma s/p R nephrectomy, IDDM, and A-fib on anticoagulation at  home admitted for a glower GI bleed likely secondary to diverticulosis.  Lower GI bleed in setting of diverticulosis Symptomatic anemia 2/2 lower GI bleed Patient s/p 3 units PRBC yesterday. Given that follow-up CBC is 7.4 this AM despite fluids and blood transfusions, it appears the patient is still bleeding significantly, which is supported by the rectal exam and multiple episodes of blood being found on sheets. Previously recommended colonoscopy by GI is Use of contrast is contraindicated by CKD. The patient's mild leukocytosis and lack of further diverticulitis signs and symptoms makes diverticulitis less likely. Considering the patient's continued bleeding, it is important to monitor for signs of hemorrhagic hypovolemic shock or further decompensation. - Discontinue Zosyn previously given for possible diverticulitis - Repeat CBC and CMP tomorrow - Continue closely monitoring vitals with MAP goal > 65 - Per GI follow-up discussion, give 1 unit PRBC today and obtain tagged RBC scan with plan to localize and embolize bleed if possible - Unable to complete colonoscopy d/t bleeding, discussed with patient's son and grandson   AKI on CKD 3 Non anion gap metabolic acidosis  Hyperkalemia Patient has underlying kidney dieasese and presents with AKI in the setting of hypovolemia 2/2 GI bleed. Possible pre-renal etiology of AKI such as ATN given the patient's previous hypovolemia. Creatinine is trending down this morning to 3.26 from 3.41 yesterday, but is still significantly elevated from baseline, which is between 1.8 and 2.4 (unclear due to lack of recent outpatient labs). Bladder scan showed 221 mL of urine in the bladder, but the patient has only had documented output of 75 mL, which is not reassuring. Creatinine clearance calculated using Cockcroft-Gault calculator to adjust for age is 16 mL/min. - Daily weights - Strict I/Os - Obtain daily magnesium - Nephrology consulted, appreciate these updated  recommendations from 6/29:             -Continue supportive care with IV fluid -Acidosis and hyperkalemia management as below -Continue to monitor daily Cr, Dose meds for GFR -Monitor Daily I/Os, Daily weight  -Maintain MAP>65 for optimal renal perfusion.  -Avoid nephrotoxic medications including NSAIDs -Use synthetic opioids (Fentanyl/Dilaudid) if needed -Discussed with family patient is not a dialysis candidate -Hold home lisinopril  Ischemic hepatitis Yesterday evening, the patient's LFTs increased dramatically to 2148 AST and 1418 ALT from 168 AST and 154 ALT on arrival to ED. These labs are consistent with end organ damage, likely due to previous hypovolemia and current anemia limiting perfusion of the liver. The goal is to improve oxygen delivery to the liver by treating underlying causes of the hepatitis. LFTs stable / slightly improved today  - Continue monitoring LFTs s/p PRBC transfusion today  Paroxysmal A-fib EKG on admission does not show active A-fib at this time. Eliquis taken outpatient for thromboembolism prophylaxis. Will hold Eliquis in setting  of lower GI bleed. Will hold other HTN and rate control medications (I.e. home lisinopril) due to hypovolemia and will reassess restarting these medications in the near future. Based on a CHA2DS2-VASc Score of 5, the patient has a 7.2% stroke risk per year. - Continuous cardiac monitoring - Resume anticoagulation upon discharge when bleed is stopped  Insulin-dependent diabetes mellitus The patient has not been at goal CBG during admission and sliding scale insulin should be titrated to lower blood glucose. - Q4 CBG monitoring - Sliding scale insulin changed from very sensitive to sensitive - Goal CBG: 140-180 while acutely ill and aim for euglycemia once bleeding resolves  Hyperkalemia Patient presented with acute hyperkalemia in setting of acute GI bleed and AKI on  CKD. Hyperkalemia has corrected s/p calcium gluconate, Lokelma,  insulin, and bicarb drip. Likely etiology was reabsorption of lysing red blood cells in GI tract as well as electrolyte disturbances due AKI - Continue to monitor electrolytes and repeat EKG if hyperkalemia recurrs   LOS: 1 day   Bricyn Labrada, Medical Student 11/11/2021, 10:16 AM  Pager number: [phone]

## 2021-11-11 NOTE — Progress Notes (Addendum)
Daily Progress Note  Hospital Day: 2  Chief Complaint: GI bleed  Brief History David Steele is a 88 y.o. male with a pmh not limited to PAF on Eliquis, CKD 3, chronic anemia, DM, prostate cancer with possible radiation seed implant several years ago, bladder cancer, RCC s/p R nephrectomy March 2023, diverticular disease. Transferred from [facility] yesterday for painless hematochezia. Hgb was 5.7.  Assessment   88 yo male with painless hematochezia with associated worsening of chronic anemia. Persistent bleeding despite reversal of Eliquis. RN and grandson [name] in room. Patient has had two episodes of large volume hematochezia with clots within the last few minutes.  Suspect diverticular bleed. Other differential considerations include AVMs, radiation proctitis, AVMs, and cancer. Received 2 units of PRBCs yesterday( last unit around MN following a hgb of 6.5). Follow up CBC pending   Elevated liver enzymes with AST of 2100 / ALT 1400s. They were 168 and 154 respectively yesterday. Ischemic?   Hyperkalemia / AKI on CKD 3b. Cr 3.41, GFR 16. Nephrology following  Afib, home meds including Eliquis are on hold   DM, uncontrolled. Glucose today is 327.   Plan   Await follow up CBC.  Get set of vital now Unable to get CTA due to kidney disease. He is at increased risk for colonoscopy / sedation which grandson [name] understands. However, Yolanda Bonine would like to proceed with a colonoscopy if this is only option to potentially diagnose and treat the bleeding Of note, he is getting Zosyn for diverticulitis. Was this seen on a scan at outside hospital or something? Does he really need it? No indication otherwise that he has diverticulitis.     Subjective   No specific complaints. Grandson in room. Still having ongoing bleeding per RN  Objective   Imaging:  US RENAL  Result Date: 11/10/2021 CLINICAL DATA:  Right nephrectomy, AKI EXAM: RENAL / URINARY TRACT ULTRASOUND  COMPLETE COMPARISON:  CT abdomen/pelvis 05/14/2021 abdomen 06/13/2018 FINDINGS: Right Kidney: Surgically absent. There is no abnormal lesion seen in the nephrectomy bed. Left Kidney: Renal measurements: 12.8 cm x 5.3 cm x 6.0 cm = volume: 213 mL. Multiple cysts are seen, the largest measuring 6.8 cm x 6.2 cm x 7.1 cm. Parenchymal echogenicity is normal. There is no hydronephrosis. Bladder: The bladder is decompressed. The left ureteral jet is seen. The prostate is enlarged. Other: None. IMPRESSION: 1. Status post right nephrectomy. No sonographic abnormality in the nephrectomy bed. 2. Multiple left renal cysts are again seen. 3. Enlarged prostate. Electronically Signed   By: Valetta Mole M.D.   On: 11/10/2021 13:00    Lab Results: Recent Labs    11/10/21 0925 11/10/21 0935  WBC 12.9*  --   HGB 7.2* 6.5*  HCT 22.6* 19.0*  PLT 221  --    BMET Recent Labs    11/10/21 0925 11/10/21 0935 11/10/21 1735  NA 133* 133* 135  K 6.6* 6.5* 4.5  CL 107 107 107  CO2 11*  --  17*  GLUCOSE 295* 281* 327*  BUN 58* 58* 64*  CREATININE 3.28* 3.50* 3.41*  CALCIUM 8.1*  --  7.8*   LFT Recent Labs    11/10/21 1735  PROT 4.3*  ALBUMIN 2.2*  AST 2,148*  ALT 1,418*  ALKPHOS 53  BILITOT 1.1   PT/INR Recent Labs    11/10/21 0925  LABPROT 17.2*  INR 1.4*     Scheduled inpatient medications:   hydrOXYzine  25 mg Intramuscular Once  insulin aspart  0-9 Units Subcutaneous Q4H   pantoprazole (PROTONIX) IV  40 mg Intravenous Q12H   sodium chloride flush  3 mL Intravenous Q12H   Continuous inpatient infusions:   piperacillin-tazobactam (ZOSYN)  IV 2.25 g (11/11/21 0619)   PRN inpatient medications:   Vital signs in last 24 hours: Temp:  [96.7 F (35.9 C)-98.6 F (37 C)] 98 F (36.7 C) (06/29 0410) Pulse Rate:  [65-90] 86 (06/29 0410) Resp:  [16-22] 18 (06/29 0410) BP: (104-149)/(45-96) 133/87 (06/29 0410) SpO2:  [93 %-100 %] 100 % (06/29 0410) Weight:  [85.2 kg] 85.2 kg (06/29  0410) Last BM Date : 11/10/21  Intake/Output Summary (Last 24 hours) at 11/11/2021 0902 Last data filed at 11/11/2021 0400 Gross per 24 hour  Intake 2240.9 ml  Output 75 ml  Net 2165.9 ml     Physical Exam:  General: Alert male in NAD. HOH Heart:  Regular rate ,No lower extremity edema Pulmonary: Normal respiratory effort Abdomen: Soft, nondistended, nontender.  Neurologic: Alert, oriented to person and place. Tells me he is at [facility]  Psych: Pleasant. Cooperative.    Intake/Output from previous day: 06/28 0701 - 06/29 0700 In: 2240.9 [I.V.:450.5; Blood:687.5; IV Piggyback:1102.9] Out: 75 [Urine:75] Intake/Output this shift: No intake/output data recorded.    Principal Problem:   Diverticulosis of colon with hemorrhage Active Problems:   T2DM (type 2 diabetes mellitus) ([place])   CKD (chronic kidney disease), stage III (HCC)   Paroxysmal atrial fibrillation ([place])   Anemia     LOS: 1 day   Tye Savoy ,NP 11/11/2021, 9:02 AM  GI ATTENDING  Interval history data reviewed.  Agree with interval progress note as outlined above.  The patient continues with significant bleeding.  Vital signs are soft.  Very difficult situation given his age, comorbidities, and renal insufficiency.  The degree of active bleeding precludes colonoscopy (no visualization through blood).  CTA precluded due to renal insufficiency.  At this point, maximal supportive care.  Consider moving to stepdown or ICU.  Aggressive support with transfusions.  Could consider tagged cell scan and if positive consideration refer for selective angiography with embolization.  We have reached out to the medicine team.  My nurse practitioner is currently speaking with the patient's son, [name].  The situation has been explained to him.  He understands.  The patient is a DNR.  Docia Chuck. Geri Seminole., M.D. [facility] Division of Gastroenterology

## 2021-11-11 NOTE — Progress Notes (Signed)
Inpatient Diabetes Program Recommendations  AACE/ADA: New Consensus Statement on Inpatient Glycemic Control (2015)  Target Ranges:  Prepandial:   less than 140 mg/dL      Peak postprandial:   less than 180 mg/dL (1-2 hours)      Critically ill patients:  140 - 180 mg/dL   Lab Results  Component Value Date   GLUCAP 289 (H) 11/11/2021   HGBA1C 7.5 (H) 11/10/2021    Review of Glycemic Control  Latest Reference Range & Units 11/10/21 11:06 11/10/21 12:56 11/10/21 16:01 11/10/21 20:42 11/11/21 04:10  Glucose-Capillary 70 - 99 mg/dL 277 (H) 269 (H) 285 (H) 321 (H) 289 (H)   Diabetes history:  DM 2 Outpatient Diabetes medications: Novolin 70/30- 30 units q AM and 25 units q PM Metformin 1000 mg bid Current orders for Inpatient glycemic control:  Novolog 0-9 units q 4 hours  Inpatient Diabetes Program Recommendations:   If appropriate, consider adding Semglee 16 units daily.    Thanks,  Adah Perl, RN, BC-ADM Inpatient Diabetes Coordinator Pager [phone]  (8a-5p)

## 2021-11-11 NOTE — Progress Notes (Signed)
Patient refusing labs this AM.  Pt is confused and unable to make medical decisions, grandson at bedside giving permission for labs to be drawn.  Per lab, POA  must be present since patient is refusing.  Dr. Howie Ill notified of this.  Per grandson, pt's son is POA, but unsure if he can come here since he just had surgery.

## 2021-11-11 NOTE — Progress Notes (Addendum)
Called placed to patient's son, David Steele, who is HPOA. We discussed continued bleeding and need for additional blood. We talked about that in the event of cardiac arrest, that is would likely be from acute blood loss and that resuscitation efforts were unlikely to be successful in that case. David Steele would like code status changed to DNR at this time.

## 2021-11-11 NOTE — Procedures (Signed)
Interventional Radiology Procedure Note  Procedure:   US guided access right CFA. Limited mesenteric angio, IMA, and the SMA/ileocolic artery CELT for closure  EBL: 5cc Total contrast: ~40cc  Findings: No target for embolization   Complications: None  Recommendations:  - Right hip straight x 1 hr.  CELT has indication for immediate ambulation, but recovery is due to 1 hr for sedation, and the patients current status - routine wound care - Consider colonoscopy for diagnostics - continue hydration/resuscitation - continue H&H trend   Signed,  Dulcy Fanny. Earleen Newport, DO

## 2021-11-11 NOTE — Consult Note (Signed)
Chief Complaint: Patient was seen in consultation today for GI bleed  Referring Physician(s): Dr. Scarlette Shorts  Supervising Physician: Corrie Mckusick  Patient Status: [facility] - In-pt  History of Present Illness: David Steele is a 88 y.o. male with past medical history of right renal tumor s/p nephrectomy March 2023, chronic kidney disease stage III, DM, HTN, paroxysmal a fib on Eliquis who was admitted from home with BRBPR.  Family states patient is independent at baseline.  He lives with his sister and helps provide care, performs household chores.  He called family early morning Tuesday with increased BRBPR which had progressed over the course of several days.   Based on prior CT imaging, he does have known diverticulosis however has never had GI bleeding.  His Eliquis was reversed on admission (last dose Monday evening). His INR as of 6/28 is 1.4, however he continues to pass fresh blood with clots. Patient has received 5u PRBCs.  Options currently limited for assessment of the source of his GI bleeding. GI consulted who felt his bleed is too significant/brisk for scoping.  A CTA Abdomen Pelvis contraindicated due to renal function with possible impending angiogram.  A NM study cannot be completed due to patient's agitation and restlessness.   PA to bedside alongside Dr. Earleen Newport.  Patient confused. Merits of a diagnostic angiogram with intent to treat/intervene was thoroughly discussed with patient's family including his HCPOA, David Steele, over speaker phone.  Patient with fresh clot on bed sheets and intermittent passage of bloody stool during visit. His BP is soft, but normotensive with SBP 102-106, HR 92-96.     Past Medical History:  Diagnosis Date   Acute respiratory failure with hypoxia ([place]) 07/03/2019   Advanced age 66/10[age]2018   Anemia    Anxiety    Aortic atherosclerosis (HCC)    Benign essential HTN 02/23/2017   Bladder cancer (HCC)    BPH (benign prostatic hyperplasia)     Cerebral ischemia    CKD (chronic kidney disease), stage III ([place]) 07/03/2019   Current use of long term anticoagulation 04/19/2019   Depressive disorder    Diabetes mellitus type 2, uncontrolled, with complications 93/81/8299   DOE (dyspnea on exertion) 02/23/2017   Dyslipidemia 07/01/2019   First degree AV block 12/14/2017   Former smoker 06/15/2017   History of bladder cancer 02/23/2017   HLD (hyperlipidemia) 07/03/2019   Hyperlipidemia    Hypertension    Orthostatic hypotension 08/31/2010   Paroxysmal atrial fibrillation ([place]) 02/23/2017   Paroxysmal ventricular tachycardia ([place]) 05/19/2020   Plantar fasciitis    Pneumonia due to COVID-19 virus 07/01/2019   Profound sensorineural hearing loss (SNHL) 11/13/2017   Formatting of this note might be different from the original. Added automatically from request for surgery [id number]   Prostate cancer ([place]) 07/03/2019   Sinus bradycardia 02/23/2017   T2DM (type 2 diabetes mellitus) ([place]) 07/01/2019    Past Surgical History:  Procedure Laterality Date   BLADDER SURGERY  2007   Cancerous tumor removed   EYE SURGERY     TONSILLECTOMY      Allergies: Simvastatin  Medications: Prior to Admission medications   Medication Sig Start Date End Date Taking? Authorizing Provider  acetaminophen (TYLENOL) 650 MG CR tablet Take 650 mg by mouth daily.   Yes [provider]  apixaban (ELIQUIS) 5 MG TABS tablet Take 5 mg by mouth 2 (two) times daily.   Yes [provider]  Brinzolamide-Brimonidine (SIMBRINZA) 1-0.2 % SUSP Apply 1 drop to eye in the  morning, at noon, and at bedtime.   Yes [provider]  busPIRone (BUSPAR) 15 MG tablet Take 15 mg by mouth 2 (two) times daily. 10/18/21  Yes [provider]  busPIRone (BUSPAR) 7.5 MG tablet Take 7.5 mg by mouth 2 (two) times daily. 08/23/21  Yes [provider]  dorzolamide-timolol (COSOPT) 22.3-6.8 MG/ML ophthalmic solution Place 1 drop into the right eye  2 (two) times daily. 10/09/21  Yes [provider]  ferrous sulfate 325 (65 FE) MG tablet Take 325 mg by mouth daily with breakfast.   Yes [provider]  finasteride (PROSCAR) 5 MG tablet Take 5 mg by mouth daily.   Yes [provider]  insulin NPH-regular Human (70-30) 100 UNIT/ML injection Inject 25 Units into the skin See admin instructions. 30 units before meal in AM and 25 units in evening before meal   Yes [provider]  lansoprazole (PREVACID) 30 MG capsule Take 30 mg by mouth daily. 01/13/21  Yes [provider]  lisinopril (ZESTRIL) 20 MG tablet TAKE 1 TABLET(20 MG) BY MOUTH DAILY Patient taking differently: Take 20 mg by mouth daily. 04/06/21  Yes Park Liter, MD  metFORMIN (GLUCOPHAGE) 1000 MG tablet Take 1,000 mg by mouth 2 (two) times daily with a meal.    Yes [provider]  mirabegron ER (MYRBETRIQ) 50 MG TB24 tablet Take 50 mg by mouth at bedtime.   Yes [provider]  Multiple Vitamins-Minerals (CENTRUM SILVER ADULT 50+ PO) Take 1 tablet by mouth daily. Unknown strength   Yes [provider]  ondansetron (ZOFRAN) 4 MG tablet Take 4 mg by mouth 2 (two) times daily as needed for vomiting or nausea. 10/28/21  Yes [provider]  PARoxetine (PAXIL) 30 MG tablet Take 30 mg by mouth daily.   Yes [provider]  traZODone (DESYREL) 50 MG tablet Take 50 mg by mouth at bedtime as needed for sleep. 10/18/21  Yes [provider]     History reviewed. No pertinent family history.  Social History   Socioeconomic History   Marital status: Single    Spouse name: Not on file   Number of children: Not on file   Years of education: Not on file   Highest education level: Not on file  Occupational History   Occupation: reired  Tobacco Use   Smoking status: Former    Types: Cigarettes    Quit date: 05/16/1988    Years since quitting: 33.5   Smokeless tobacco: Never  Vaping Use    Vaping Use: Former  Substance and Sexual Activity   Alcohol use: No   Drug use: No   Sexual activity: Not Currently  Other Topics Concern   Not on file  Social History Narrative   Not on file   Social Determinants of Health   Financial Resource Strain: Not on file  Food Insecurity: Not on file  Transportation Needs: Not on file  Physical Activity: Not on file  Stress: Not on file  Social Connections: Not on file     Review of Systems: A 12 point ROS discussed and pertinent positives are indicated in the HPI above.  All other systems are negative.  Review of Systems  Unable to perform ROS: Mental status change    Vital Signs: BP (!) 102/91   Pulse 84   Temp 99.3 F (37.4 C) (Axillary)   Resp 18   Ht '5\' 11"'$  (1.803 m)   Wt 187 lb 13.3 oz (85.2 kg)  SpO2 98%   BMI 26.20 kg/m   Physical Exam Vitals and nursing note reviewed.  Constitutional:      General: He is not in acute distress.    Appearance: Normal appearance. He is ill-appearing.  HENT:     Mouth/Throat:     Mouth: Mucous membranes are moist.     Pharynx: Oropharynx is clear.  Cardiovascular:     Rate and Rhythm: Normal rate and regular rhythm.  Pulmonary:     Effort: Pulmonary effort is normal.     Breath sounds: Normal breath sounds.  Abdominal:     General: Abdomen is flat. There is no distension.     Palpations: Abdomen is soft.  Skin:    General: Skin is warm and dry.  Neurological:     General: No focal deficit present.     Mental Status: He is alert and oriented to person, place, and time. Mental status is at baseline.  Psychiatric:        Mood and Affect: Mood normal.        Behavior: Behavior normal.      MD Evaluation Airway: WNL Heart: WNL Abdomen: WNL Chest/ Lungs: WNL ASA  Classification: 3 Mallampati/Airway Score: Two   Imaging: US RENAL  Result Date: 11/10/2021 CLINICAL DATA:  Right nephrectomy, AKI EXAM: RENAL / URINARY TRACT ULTRASOUND COMPLETE COMPARISON:  CT  abdomen/pelvis 05/14/2021 abdomen 06/13/2018 FINDINGS: Right Kidney: Surgically absent. There is no abnormal lesion seen in the nephrectomy bed. Left Kidney: Renal measurements: 12.8 cm x 5.3 cm x 6.0 cm = volume: 213 mL. Multiple cysts are seen, the largest measuring 6.8 cm x 6.2 cm x 7.1 cm. Parenchymal echogenicity is normal. There is no hydronephrosis. Bladder: The bladder is decompressed. The left ureteral jet is seen. The prostate is enlarged. Other: None. IMPRESSION: 1. Status post right nephrectomy. No sonographic abnormality in the nephrectomy bed. 2. Multiple left renal cysts are again seen. 3. Enlarged prostate. Electronically Signed   By: Valetta Mole M.D.   On: 11/10/2021 13:00    Labs:  CBC: Recent Labs    11/10/21 0925 11/10/21 0935 11/11/21 0903  WBC 12.9*  --  11.2*  HGB 7.2* 6.5* 7.4*  HCT 22.6* 19.0* 21.5*  PLT 221  --  190    COAGS: Recent Labs    11/10/21 0925  INR 1.4*    BMP: Recent Labs    11/10/21 0925 11/10/21 0935 11/10/21 1735 11/11/21 0903  NA 133* 133* 135 139  K 6.6* 6.5* 4.5 4.0  CL 107 107 107 110  CO2 11*  --  17* 16*  GLUCOSE 295* 281* 327* 337*  BUN 58* 58* 64* 67*  CALCIUM 8.1*  --  7.8* 7.7*  CREATININE 3.28* 3.50* 3.41* 3.26*  GFRNONAA 17*  --  16* 17*    LIVER FUNCTION TESTS: Recent Labs    11/10/21 0925 11/10/21 1735 11/11/21 0903  BILITOT 1.2 1.1 1.4*  AST 168* 2,148* 1,624*  ALT 154* 1,418* 1,273*  ALKPHOS 56 53 143*  PROT 4.6* 4.3* 4.2*  ALBUMIN 2.3* 2.2* 2.2*    TUMOR MARKERS: No results for input(s): "AFPTM", "CEA", "CA199", "CHROMGRNA" in the last 8760 hours.  Assessment and Plan: GI bleed Patient with history of diverticulosis, chronic kidney disease, right renal mass s/p nephrectomy March 2023 now admitted with BRBPR, confusion.   Patient with ongoing passage of blood and clot.  No imaging studies performed due to acute blood loss with intent to preserve renal function in the setting  of solitary left  kidney with CKD3.   Plan to proceed with diagnostic angiogram with intent to treat with Dr. Earleen Newport this afternoon.   This was discussed at length with patient's son and grandson who agree to proceed.  Risks and benefits were discussed with the patient including, but not limited to bleeding, infection, vascular injury or contrast induced renal failure.  This interventional procedure involves the use of X-rays and because of the nature of the planned procedure, it is possible that we will have prolonged use of X-ray fluoroscopy.  Potential radiation risks to you include (but are not limited to) the following: - A slightly elevated risk for cancer  several years later in life. This risk is typically less than 0.5% percent. This risk is low in comparison to the normal incidence of human cancer, which is 33% for women and 50% for men according to the [place]. - Radiation induced injury can include skin redness, resembling a rash, tissue breakdown / ulcers and hair loss (which can be temporary or permanent).   The likelihood of either of these occurring depends on the difficulty of the procedure and whether you are sensitive to radiation due to previous procedures, disease, or genetic conditions.   IF your procedure requires a prolonged use of radiation, you will be notified and given written instructions for further action.  It is your responsibility to monitor the irradiated area for the 2 weeks following the procedure and to notify your physician if you are concerned that you have suffered a radiation induced injury.    All of the patient's questions were answered, patient is agreeable to proceed.  Consent signed and in chart.  Thank you for this interesting consult.  I greatly enjoyed meeting David Steele and look forward to participating in their care.  A copy of this report was sent to the requesting provider on this date.  Electronically Signed: Docia Barrier, PA 11/11/2021, 2:25 PM   I spent a total of 40 Minutes    in face to face in clinical consultation, greater than 50% of which was counseling/coordinating care for GI bleed.

## 2021-11-11 NOTE — Significant Event (Signed)
Rapid Response Event Note   Reason for Call :  While on unit, RN informed me of pt with multiple bloody BMs this morning and asking me to assess.   Initial Focused Assessment:  On arrival, pt laying in bed with soiled pad and sheets- bright red blood and some clots noted. PRBC infusing (last Hgb 7.4). Pt confused and agitated not allowing treatment- fighting staff when trying to place O2. Pt appears pale, warm and dry to touch. HR 90, BP 102/45, RR 16, spO2 91% on RA (while at rest. When moving or agitated spO2 noted to drop to 80s. Unsure how accurate- pt not allowing [place] to place another pulse ox).    Interventions:  4th unit PRBC infusing on arrival Primary team to bedside Additional PIV placed to LUA IR consulted Additional PRBC ordered LR Bolus  Plan of Care:  Continue to monitor pt status. Transfuse blood products, give fluid to help with soft BP. Pt initially scheduled for Nuc Med bleeding scan but unable to sit still for 4+ hrs. IR consulted for possible angiogram.   RN instructed to call with any changes or concerns.   Event Summary:   MD Notified: Christiana Fuchs- IM Call Time: [place] End Time: [place]  Sherilyn Dacosta, RN

## 2021-11-12 DIAGNOSIS — K7200 Acute and subacute hepatic failure without coma: Secondary | ICD-10-CM | POA: Diagnosis not present

## 2021-11-12 DIAGNOSIS — D62 Acute posthemorrhagic anemia: Secondary | ICD-10-CM | POA: Diagnosis not present

## 2021-11-12 DIAGNOSIS — K5731 Diverticulosis of large intestine without perforation or abscess with bleeding: Secondary | ICD-10-CM | POA: Diagnosis not present

## 2021-11-12 DIAGNOSIS — N1832 Chronic kidney disease, stage 3b: Secondary | ICD-10-CM | POA: Diagnosis not present

## 2021-11-12 DIAGNOSIS — N179 Acute kidney failure, unspecified: Secondary | ICD-10-CM | POA: Diagnosis not present

## 2021-11-12 DIAGNOSIS — D649 Anemia, unspecified: Secondary | ICD-10-CM | POA: Diagnosis not present

## 2021-11-12 LAB — BASIC METABOLIC PANEL
Anion gap: 9 (ref 5–15)
BUN: 61 mg/dL — ABNORMAL HIGH (ref 8–23)
CO2: 18 mmol/L — ABNORMAL LOW (ref 22–32)
Calcium: 7.5 mg/dL — ABNORMAL LOW (ref 8.9–10.3)
Chloride: 114 mmol/L — ABNORMAL HIGH (ref 98–111)
Creatinine, Ser: 2.72 mg/dL — ABNORMAL HIGH (ref 0.61–1.24)
GFR, Estimated: 21 mL/min — ABNORMAL LOW (ref >60)
Glucose, Bld: 239 mg/dL — ABNORMAL HIGH (ref 70–99)
Potassium: 3.7 mmol/L (ref 3.5–5.1)
Sodium: 141 mmol/L (ref 135–145)

## 2021-11-12 LAB — GLUCOSE, CAPILLARY
Glucose-Capillary: 239 mg/dL — ABNORMAL HIGH (ref 70–99)
Glucose-Capillary: 192 mg/dL — ABNORMAL HIGH (ref 70–99)
Glucose-Capillary: 276 mg/dL — ABNORMAL HIGH (ref 70–99)
Glucose-Capillary: 241 mg/dL — ABNORMAL HIGH (ref 70–99)
Glucose-Capillary: 233 mg/dL — ABNORMAL HIGH (ref 70–99)

## 2021-11-12 LAB — HEPATIC FUNCTION PANEL
ALT: 911 U/L — ABNORMAL HIGH (ref 0–44)
AST: 645 U/L — ABNORMAL HIGH (ref 15–41)
Albumin: 2 g/dL — ABNORMAL LOW (ref 3.5–5.0)
Alkaline Phosphatase: 103 U/L (ref 38–126)
Bilirubin, Direct: 0.2 mg/dL (ref 0.0–0.2)
Indirect Bilirubin: 0.8 mg/dL (ref 0.3–0.9)
Total Bilirubin: 1 mg/dL (ref 0.3–1.2)
Total Protein: 3.7 g/dL — ABNORMAL LOW (ref 6.5–8.1)

## 2021-11-12 LAB — CBC
HCT: 22.1 % — ABNORMAL LOW (ref 39.0–52.0)
Hemoglobin: 7.5 g/dL — ABNORMAL LOW (ref 13.0–17.0)
MCH: 28.8 pg (ref 26.0–34.0)
MCHC: 33.9 g/dL (ref 30.0–36.0)
MCV: 85 fL (ref 80.0–100.0)
Platelets: 181 10*3/uL (ref 150–400)
RBC: 2.6 MIL/uL — ABNORMAL LOW (ref 4.22–5.81)
RDW: 17 % — ABNORMAL HIGH (ref 11.5–15.5)
WBC: 12.7 10*3/uL — ABNORMAL HIGH (ref 4.0–10.5)
nRBC: 0.3 % — ABNORMAL HIGH (ref 0.0–0.2)

## 2021-11-12 LAB — MAGNESIUM: Magnesium: 1.6 mg/dL — ABNORMAL LOW (ref 1.7–2.4)

## 2021-11-12 MED ORDER — MUSCLE RUB 10-15 % EX CREA
TOPICAL_CREAM | CUTANEOUS | Status: DC | PRN
Start: 2021-11-12 — End: 2021-11-17
  Administered 2021-11-12: 1 via TOPICAL
  Filled 2021-11-12: qty 85

## 2021-11-12 MED ORDER — LACTATED RINGERS IV SOLN: INTRAVENOUS | Status: AC

## 2021-11-12 MED ORDER — INSULIN GLARGINE-YFGN 100 UNIT/ML ~~LOC~~ SOLN
5.0000 [IU] | Freq: Every day | SUBCUTANEOUS | Status: DC
Start: 2021-11-12 — End: 2021-11-13
  Administered 2021-11-12: 5 [IU] via SUBCUTANEOUS
  Filled 2021-11-12 (×2): qty 0.05

## 2021-11-12 MED ORDER — INSULIN ASPART 100 UNIT/ML IJ SOLN
0.0000 [IU] | Freq: Three times a day (TID) | INTRAMUSCULAR | Status: DC
  Administered 2021-11-12 – 2021-11-13 (×2): 5 [IU] via SUBCUTANEOUS
  Administered 2021-11-13: 3 [IU] via SUBCUTANEOUS
  Administered 2021-11-13: 5 [IU] via SUBCUTANEOUS
  Administered 2021-11-14: 2 [IU] via SUBCUTANEOUS
  Administered 2021-11-14: 8 [IU] via SUBCUTANEOUS
  Administered 2021-11-14: 2 [IU] via SUBCUTANEOUS
  Administered 2021-11-15 (×2): 3 [IU] via SUBCUTANEOUS
  Administered 2021-11-15: 5 [IU] via SUBCUTANEOUS
  Administered 2021-11-16: 8 [IU] via SUBCUTANEOUS
  Administered 2021-11-16: 3 [IU] via SUBCUTANEOUS
  Administered 2021-11-16: 5 [IU] via SUBCUTANEOUS
  Administered 2021-11-17: 3 [IU] via SUBCUTANEOUS
  Administered 2021-11-17: 8 [IU] via SUBCUTANEOUS

## 2021-11-12 MED ORDER — MAGNESIUM SULFATE 2 GM/50ML IV SOLN
2.0000 g | Freq: Once | INTRAVENOUS | Status: AC
Start: 2021-11-12 — End: 2021-11-12
  Administered 2021-11-12: 2 g via INTRAVENOUS
  Filled 2021-11-12: qty 50

## 2021-11-12 MED ORDER — MAGNESIUM SULFATE 2 GM/50ML IV SOLN
2.0000 g | Freq: Once | INTRAVENOUS | Status: AC
  Administered 2021-11-12: 2 g via INTRAVENOUS
  Filled 2021-11-12: qty 50

## 2021-11-12 NOTE — Progress Notes (Signed)
Inpatient Diabetes Program Recommendations  AACE/ADA: New Consensus Statement on Inpatient Glycemic Control (2015)  Target Ranges:  Prepandial:   less than 140 mg/dL      Peak postprandial:   less than 180 mg/dL (1-2 hours)      Critically ill patients:  140 - 180 mg/dL   Lab Results  Component Value Date   GLUCAP 192 (H) 11/12/2021   HGBA1C 7.5 (H) 11/10/2021    Review of Glycemic Control  Latest Reference Range & Units 11/11/21 04:10 11/11/21 12:09 11/11/21 17:12 11/11/21 21:01 11/12/21 01:26 11/12/21 05:08  Glucose-Capillary 70 - 99 mg/dL 289 (H) 316 (H) 311 (H) 283 (H) 239 (H) 192 (H)   Diabetes history: DM 2 Outpatient Diabetes medications: Novolin 70/30- 30 units q AM and 25 units q PM Metformin 1000 mg bid Current orders for Inpatient glycemic control:  Novolog 0-9 units q 4 hours Inpatient Diabetes Program Recommendations:    Consider adding Semglee 10 units daily.   Thanks,  Adah Perl, RN, BC-ADM Inpatient Diabetes Coordinator Pager [phone]  (8a-5p)

## 2021-11-12 NOTE — Progress Notes (Signed)
Nephrology Follow-Up Consult note   Assessment/Recommendations: Epic David Steele is a/an 88 y.o. male with a past medical history significant for HTN, HLD, DM2, afib who present w/ GI bleeding c/b AKI and hyperkalemia   AKI on CKD 3b: Unclear UOP. BL Crt at least 1.8 after nephrectomy but baseline may be even higher; no recent outpatient labs.  Creatinine was 2.4 at the outside hospital before transfer.  Most likely some degree of ATN related to anemia and possible hypotension.  Creatinine improved to 2.7 today -Continue supportive care with transfusions as needed -We will continue some IV fluids today -Acidosis and hyperkalemia management as below -Continue to monitor daily Cr, Dose meds for GFR -Monitor Daily I/Os, Daily weight  -Maintain MAP>65 for optimal renal perfusion.  -Avoid nephrotoxic medications including NSAIDs -Use synthetic opioids (Fentanyl/Dilaudid) if needed -Discussed with family patient is not a dialysis candidate -Hold home lisinopril   GI bleeding: GI involved.  Likely diverticular bleed on anticoagulation.  Angiogram w/ IR w/o target.  Continue transfusions as needed   Hyperkalemia: Now resolved with medical treatment   Metabolic acidosis: Likely anion gap and non-anion gap.  Continue oral sodium bicarb '1300mg'$  BID   Atrial fibrillation: Holding home medications   History of RCC: Status post right nephrectomy in March 2023   Uncontrolled type 2 diabetes with hyperglycemia: Management per primary team  Elevation of LFTs: AST/ALT increasing. Possibly some degree of shock. Improving  Delirium: multifactorial. Mgmt per primary     Recommendations conveyed to primary service.    [facility] 11/12/2021 10:00 AM  ___________________________________________________________  CC: AKI, GI bleeding  Interval History/Subjective: Patient continues to be delirious this morning and difficult to understand.  Nursing staff states that  he slept better last night.  Underwent limited mesenteric angiogram yesterday without a target for embolization.  Continues to have some bloody bowel movements.  Received 2 units of blood yesterday.   Medications:  Current Facility-Administered Medications  Medication Dose Route Frequency Provider Last Rate Last Admin   Chlorhexidine Gluconate Cloth 2 % PADS 6 each  6 each Topical Daily Velna Ochs, MD   6 each at 11/11/21 1333   hydrOXYzine (VISTARIL) injection 25 mg  25 mg Intramuscular Once Gaylan Gerold, DO       insulin aspart (novoLOG) injection 0-9 Units  0-9 Units Subcutaneous Q4H Masters, Katie, DO   2 Units at 11/12/21 0532   magnesium sulfate IVPB 2 g 50 mL  2 g Intravenous Once Masters, Katie, DO 50 mL/hr at 11/12/21 0950 2 g at 11/12/21 0950   Muscle Rub CREA   Topical PRN Velna Ochs, MD   1 Application at 60/10/93 0556   pantoprazole (PROTONIX) injection 40 mg  40 mg Intravenous Q12H Candee Furbish, MD   40 mg at 11/12/21 0947   sodium bicarbonate tablet 1,300 mg  1,300 mg Oral BID Reesa Chew, MD   1,300 mg at 11/12/21 2355   sodium chloride flush (NS) 0.9 % injection 3 mL  3 mL Intravenous Zettie Cooley, MD   3 mL at 11/12/21 0955      Review of Systems: Unable to obtain due to the patient's AMS  Physical Exam: Vitals:   11/11/21 2328 11/12/21 0341  BP: 133/84 (!) 116/50  Pulse: 77 69  Resp: 15 16  Temp: 98.8 F (37.1 C) 97.9 F (36.6 C)  SpO2: 100% 100%   No intake/output data recorded.  Intake/Output Summary (Last 24 hours) at 11/12/2021 1000 Last  data filed at 11/12/2021 0000 Gross per 24 hour  Intake 1210 ml  Output 825 ml  Net 385 ml   Constitutional: Ill-appearing, agitated, lying in bed ENMT: ears and nose without scars or lesions, MMM CV: normal rate, no edema Respiratory: Bilateral chest rise, normal work of breathing Gastrointestinal: soft, non-tender, no palpable masses or hernias Skin: no visible lesions or  rashes Psych: Tired, intermittently interactive, difficulty hearing, agitated   Test Results I personally reviewed new and old clinical labs and radiology tests Lab Results  Component Value Date   NA 141 11/12/2021   K 3.7 11/12/2021   CL 114 (H) 11/12/2021   CO2 18 (L) 11/12/2021   BUN 61 (H) 11/12/2021   CREATININE 2.72 (H) 11/12/2021   GFR 82.39 09/26/2011   CALCIUM 7.5 (L) 11/12/2021   ALBUMIN 2.0 (L) 11/12/2021   PHOS 1.9 (L) 07/06/2019    CBC Recent Labs  Lab 11/10/21 0925 11/10/21 0935 11/11/21 0903 11/11/21 1837 11/12/21 0235  WBC 12.9*  --  11.2* 12.5* 12.7*  NEUTROABS 10.9*  --   --   --   --   HGB 7.2*   < > 7.4* 7.8* 7.5*  HCT 22.6*   < > 21.5* 23.6* 22.1*  MCV 89.3  --  83.0 85.5 85.0  PLT 221  --  190 167 181   < > = values in this interval not displayed.

## 2021-11-12 NOTE — Progress Notes (Signed)
Referring Physician(s): Dr. Rosine Door  Supervising Physician: Mir, Sharen Heck  Patient Status:  [facility] - In-pt  Chief Complaint:  BRBPR s/p mesenteric angiogram on 6.29.23 with no active bleeding noted.   Subjective:  Patient laying bed states that he does not have any abdominal pain. No guarding or tenderness noted to access site of abdominal pain.  Allergies: Simvastatin  Medications: Prior to Admission medications   Medication Sig Start Date End Date Taking? Authorizing Provider  acetaminophen (TYLENOL) 650 MG CR tablet Take 650 mg by mouth daily.   Yes [provider]  apixaban (ELIQUIS) 5 MG TABS tablet Take 5 mg by mouth 2 (two) times daily.   Yes [provider]  Brinzolamide-Brimonidine (SIMBRINZA) 1-0.2 % SUSP Apply 1 drop to eye in the morning, at noon, and at bedtime.   Yes [provider]  busPIRone (BUSPAR) 15 MG tablet Take 15 mg by mouth 2 (two) times daily. 10/18/21  Yes [provider]  busPIRone (BUSPAR) 7.5 MG tablet Take 7.5 mg by mouth 2 (two) times daily. 08/23/21  Yes [provider]  dorzolamide-timolol (COSOPT) 22.3-6.8 MG/ML ophthalmic solution Place 1 drop into the right eye 2 (two) times daily. 10/09/21  Yes [provider]  ferrous sulfate 325 (65 FE) MG tablet Take 325 mg by mouth daily with breakfast.   Yes [provider]  finasteride (PROSCAR) 5 MG tablet Take 5 mg by mouth daily.   Yes [provider]  insulin NPH-regular Human (70-30) 100 UNIT/ML injection Inject 25 Units into the skin See admin instructions. 30 units before meal in AM and 25 units in evening before meal   Yes [provider]  lansoprazole (PREVACID) 30 MG capsule Take 30 mg by mouth daily. 01/13/21  Yes [provider]  lisinopril (ZESTRIL) 20 MG tablet TAKE 1 TABLET(20 MG) BY MOUTH DAILY Patient taking differently: Take 20 mg by mouth daily. 04/06/21  Yes Park Liter, MD  metFORMIN  (GLUCOPHAGE) 1000 MG tablet Take 1,000 mg by mouth 2 (two) times daily with a meal.    Yes [provider]  mirabegron ER (MYRBETRIQ) 50 MG TB24 tablet Take 50 mg by mouth at bedtime.   Yes [provider]  Multiple Vitamins-Minerals (CENTRUM SILVER ADULT 50+ PO) Take 1 tablet by mouth daily. Unknown strength   Yes [provider]  ondansetron (ZOFRAN) 4 MG tablet Take 4 mg by mouth 2 (two) times daily as needed for vomiting or nausea. 10/28/21  Yes [provider]  PARoxetine (PAXIL) 30 MG tablet Take 30 mg by mouth daily.   Yes [provider]  traZODone (DESYREL) 50 MG tablet Take 50 mg by mouth at bedtime as needed for sleep. 10/18/21  Yes [provider]     Vital Signs: BP (!) 144/68 (BP Location: Right Arm)   Pulse 78   Temp 98.5 F (36.9 C) (Axillary)   Resp 16   Ht '5\' 11"'$  (1.803 m)   Wt 191 lb 12.8 oz (87 kg)   SpO2 95%   BMI 26.75 kg/m   Physical Exam Vitals and nursing note reviewed.  Constitutional:      Appearance: He is well-developed.  HENT:     Head: Normocephalic.  Pulmonary:     Effort: Pulmonary effort is normal.  Abdominal:     Tenderness: There is no abdominal tenderness. There is no guarding.     Comments: Site is soft with no active bleeding and no appreciable pseudoaneurysm. Dressing is  C/D/I   Musculoskeletal:        General: Normal range of motion.     Cervical back: Normal range of motion.  Skin:    General: Skin is dry.  Neurological:     Mental Status: He is alert.     Imaging: IR Angiogram Visceral Selective  Result Date: 11/12/2021 INDICATION: 88 year old male with lower GI bleeding, acute on chronic renal failure. EXAM: ULTRASOUND-GUIDED ACCESS RIGHT COMMON FEMORAL ARTERY MESENTERIC ANGIOGRAM CELT DEVICE FOR CLOSURE MEDICATIONS: None ANESTHESIA/SEDATION: Moderate (conscious) sedation was employed during this procedure. A total of Versed 1.0 mg and Fentanyl 50 mcg was administered  intravenously by the radiology nurse. Total intra-service moderate Sedation Time: 34 minutes. The patient's level of consciousness and vital signs were monitored continuously by radiology nursing throughout the procedure under my direct supervision. CONTRAST:  40 cc FLUOROSCOPY: Radiation Exposure Index (as provided by the fluoroscopic device): 161 mGy Kerma COMPLICATIONS: None PROCEDURE: Informed consent was obtained from the patient following explanation of the procedure, risks, benefits and alternatives. The patient understands, agrees and consents for the procedure. All questions were addressed. A time out was performed prior to the initiation of the procedure. Maximal barrier sterile technique utilized including caps, mask, sterile gowns, sterile gloves, large sterile drape, hand hygiene, and Betadine prep. Ultrasound survey of the right inguinal region was performed with images stored and sent to PACs, confirming patency of the vessel. A micropuncture needle was used access the right common femoral artery under ultrasound. With excellent arterial blood flow returned, and an .018 micro wire was passed through the needle, observed enter the abdominal aorta under fluoroscopy. The needle was removed, and a micropuncture sheath was placed over the wire. The inner dilator and wire were removed, and an 035 Bentson wire was advanced under fluoroscopy into the abdominal aorta. The sheath was removed and a standard 5 [place] vascular sheath was placed. The dilator was removed and the sheath was flushed. Mickelson catheter was advanced on the Bentson wire and formed within the lower thoracic aorta. Mickelson catheter was then used to engage the IMA. Angiogram was performed. Penumbra lantern microcatheter was then advanced on 014 fathom wire into the superior rectal arteries. Angiogram was performed. Catheter was withdrawn, and then advanced into the left colic artery. Angiogram was performed. Catheter was then withdrawn  and advanced into the mesenteric artery supplying grip fifths point along the marginal artery of [place]. Angiogram was performed. Microcatheter and microwire were then withdrawn and the Mickelson catheter was used to select the superior mesenteric artery. Angiogram was performed. Catheter was withdrawn slightly such that the tip was engaged into replaced right hepatic artery. Limited angiogram was performed confirming replaced right hepatic artery. All catheters and wires were then removed. A Celt device was deployed for hemostasis. Patient tolerated procedure well and remained hemodynamically stable throughout. No complications and no significant blood loss. FINDINGS: Angiogram of the inferior mesenteric artery and superior mesenteric artery, with interest to the length of the colon as the suspected site of lower GI bleeding demonstrates no target for embolization. Replaced right hepatic artery IMPRESSION: Status post ultrasound guided access right common femoral artery, with mesenteric angiogram performed of the length of the colon identifying no target for possible embolization. Celt deployed for hemostasis. Signed, Dulcy Fanny. Nadene Rubins, RPVI [facility] Inter[facility] Radiology Special[facility] Radiology Electronically Signed   By: Corrie Mckusick D.O.   On: 11/12/2021 09:51   IR US Guide Vasc Access Right  Result Date: 11/12/2021 INDICATION: 88 year old  male with lower GI bleeding, acute on chronic renal failure. EXAM: ULTRASOUND-GUIDED ACCESS RIGHT COMMON FEMORAL ARTERY MESENTERIC ANGIOGRAM CELT DEVICE FOR CLOSURE MEDICATIONS: None ANESTHESIA/SEDATION: Moderate (conscious) sedation was employed during this procedure. A total of Versed 1.0 mg and Fentanyl 50 mcg was administered intravenously by the radiology nurse. Total intra-service moderate Sedation Time: 34 minutes. The patient's level of consciousness and vital signs were monitored continuously by radiology nursing throughout the procedure  under my direct supervision. CONTRAST:  40 cc FLUOROSCOPY: Radiation Exposure Index (as provided by the fluoroscopic device): 272 mGy Kerma COMPLICATIONS: None PROCEDURE: Informed consent was obtained from the patient following explanation of the procedure, risks, benefits and alternatives. The patient understands, agrees and consents for the procedure. All questions were addressed. A time out was performed prior to the initiation of the procedure. Maximal barrier sterile technique utilized including caps, mask, sterile gowns, sterile gloves, large sterile drape, hand hygiene, and Betadine prep. Ultrasound survey of the right inguinal region was performed with images stored and sent to PACs, confirming patency of the vessel. A micropuncture needle was used access the right common femoral artery under ultrasound. With excellent arterial blood flow returned, and an .018 micro wire was passed through the needle, observed enter the abdominal aorta under fluoroscopy. The needle was removed, and a micropuncture sheath was placed over the wire. The inner dilator and wire were removed, and an 035 Bentson wire was advanced under fluoroscopy into the abdominal aorta. The sheath was removed and a standard 5 [place] vascular sheath was placed. The dilator was removed and the sheath was flushed. Mickelson catheter was advanced on the Bentson wire and formed within the lower thoracic aorta. Mickelson catheter was then used to engage the IMA. Angiogram was performed. Penumbra lantern microcatheter was then advanced on 014 fathom wire into the superior rectal arteries. Angiogram was performed. Catheter was withdrawn, and then advanced into the left colic artery. Angiogram was performed. Catheter was then withdrawn and advanced into the mesenteric artery supplying grip fifths point along the marginal artery of [place]. Angiogram was performed. Microcatheter and microwire were then withdrawn and the Mickelson catheter was used to  select the superior mesenteric artery. Angiogram was performed. Catheter was withdrawn slightly such that the tip was engaged into replaced right hepatic artery. Limited angiogram was performed confirming replaced right hepatic artery. All catheters and wires were then removed. A Celt device was deployed for hemostasis. Patient tolerated procedure well and remained hemodynamically stable throughout. No complications and no significant blood loss. FINDINGS: Angiogram of the inferior mesenteric artery and superior mesenteric artery, with interest to the length of the colon as the suspected site of lower GI bleeding demonstrates no target for embolization. Replaced right hepatic artery IMPRESSION: Status post ultrasound guided access right common femoral artery, with mesenteric angiogram performed of the length of the colon identifying no target for possible embolization. Celt deployed for hemostasis. Signed, Dulcy Fanny. Nadene Rubins, RPVI Vascular and Interventional Radiology Specialists [facility] Radiology Electronically Signed   By: Corrie Mckusick D.O.   On: 11/12/2021 09:51   IR Angiogram Selective Each Additional Vessel  Result Date: 11/12/2021 INDICATION: 88 year old male with lower GI bleeding, acute on chronic renal failure. EXAM: ULTRASOUND-GUIDED ACCESS RIGHT COMMON FEMORAL ARTERY MESENTERIC ANGIOGRAM CELT DEVICE FOR CLOSURE MEDICATIONS: None ANESTHESIA/SEDATION: Moderate (conscious) sedation was employed during this procedure. A total of Versed 1.0 mg and Fentanyl 50 mcg was administered intravenously by the radiology nurse. Total intra-service moderate Sedation Time: 34 minutes. The patient's level  of consciousness and vital signs were monitored continuously by radiology nursing throughout the procedure under my direct supervision. CONTRAST:  40 cc FLUOROSCOPY: Radiation Exposure Index (as provided by the fluoroscopic device): 409 mGy Kerma COMPLICATIONS: None PROCEDURE: Informed consent was obtained  from the patient following explanation of the procedure, risks, benefits and alternatives. The patient understands, agrees and consents for the procedure. All questions were addressed. A time out was performed prior to the initiation of the procedure. Maximal barrier sterile technique utilized including caps, mask, sterile gowns, sterile gloves, large sterile drape, hand hygiene, and Betadine prep. Ultrasound survey of the right inguinal region was performed with images stored and sent to PACs, confirming patency of the vessel. A micropuncture needle was used access the right common femoral artery under ultrasound. With excellent arterial blood flow returned, and an .018 micro wire was passed through the needle, observed enter the abdominal aorta under fluoroscopy. The needle was removed, and a micropuncture sheath was placed over the wire. The inner dilator and wire were removed, and an 035 Bentson wire was advanced under fluoroscopy into the abdominal aorta. The sheath was removed and a standard 5 [place] vascular sheath was placed. The dilator was removed and the sheath was flushed. Mickelson catheter was advanced on the Bentson wire and formed within the lower thoracic aorta. Mickelson catheter was then used to engage the IMA. Angiogram was performed. Penumbra lantern microcatheter was then advanced on 014 fathom wire into the superior rectal arteries. Angiogram was performed. Catheter was withdrawn, and then advanced into the left colic artery. Angiogram was performed. Catheter was then withdrawn and advanced into the mesenteric artery supplying grip fifths point along the marginal artery of [place]. Angiogram was performed. Microcatheter and microwire were then withdrawn and the Mickelson catheter was used to select the superior mesenteric artery. Angiogram was performed. Catheter was withdrawn slightly such that the tip was engaged into replaced right hepatic artery. Limited angiogram was performed confirming  replaced right hepatic artery. All catheters and wires were then removed. A Celt device was deployed for hemostasis. Patient tolerated procedure well and remained hemodynamically stable throughout. No complications and no significant blood loss. FINDINGS: Angiogram of the inferior mesenteric artery and superior mesenteric artery, with interest to the length of the colon as the suspected site of lower GI bleeding demonstrates no target for embolization. Replaced right hepatic artery IMPRESSION: Status post ultrasound guided access right common femoral artery, with mesenteric angiogram performed of the length of the colon identifying no target for possible embolization. Celt deployed for hemostasis. Signed, Dulcy Fanny. Nadene Rubins, RPVI Vascular and Interventional Radiology Specialists [facility] Radiology Electronically Signed   By: Corrie Mckusick D.O.   On: 11/12/2021 09:51   IR Angiogram Selective Each Additional Vessel  Result Date: 11/12/2021 INDICATION: 88 year old male with lower GI bleeding, acute on chronic renal failure. EXAM: ULTRASOUND-GUIDED ACCESS RIGHT COMMON FEMORAL ARTERY MESENTERIC ANGIOGRAM CELT DEVICE FOR CLOSURE MEDICATIONS: None ANESTHESIA/SEDATION: Moderate (conscious) sedation was employed during this procedure. A total of Versed 1.0 mg and Fentanyl 50 mcg was administered intravenously by the radiology nurse. Total intra-service moderate Sedation Time: 34 minutes. The patient's level of consciousness and vital signs were monitored continuously by radiology nursing throughout the procedure under my direct supervision. CONTRAST:  40 cc FLUOROSCOPY: Radiation Exposure Index (as provided by the fluoroscopic device): 811 mGy Kerma COMPLICATIONS: None PROCEDURE: Informed consent was obtained from the patient following explanation of the procedure, risks, benefits and alternatives. The patient understands, agrees and consents for  the procedure. All questions were addressed. A time out was  performed prior to the initiation of the procedure. Maximal barrier sterile technique utilized including caps, mask, sterile gowns, sterile gloves, large sterile drape, hand hygiene, and Betadine prep. Ultrasound survey of the right inguinal region was performed with images stored and sent to PACs, confirming patency of the vessel. A micropuncture needle was used access the right common femoral artery under ultrasound. With excellent arterial blood flow returned, and an .018 micro wire was passed through the needle, observed enter the abdominal aorta under fluoroscopy. The needle was removed, and a micropuncture sheath was placed over the wire. The inner dilator and wire were removed, and an 035 Bentson wire was advanced under fluoroscopy into the abdominal aorta. The sheath was removed and a standard 5 [place] vascular sheath was placed. The dilator was removed and the sheath was flushed. Mickelson catheter was advanced on the Bentson wire and formed within the lower thoracic aorta. Mickelson catheter was then used to engage the IMA. Angiogram was performed. Penumbra lantern microcatheter was then advanced on 014 fathom wire into the superior rectal arteries. Angiogram was performed. Catheter was withdrawn, and then advanced into the left colic artery. Angiogram was performed. Catheter was then withdrawn and advanced into the mesenteric artery supplying grip fifths point along the marginal artery of [place]. Angiogram was performed. Microcatheter and microwire were then withdrawn and the Mickelson catheter was used to select the superior mesenteric artery. Angiogram was performed. Catheter was withdrawn slightly such that the tip was engaged into replaced right hepatic artery. Limited angiogram was performed confirming replaced right hepatic artery. All catheters and wires were then removed. A Celt device was deployed for hemostasis. Patient tolerated procedure well and remained hemodynamically stable throughout.  No complications and no significant blood loss. FINDINGS: Angiogram of the inferior mesenteric artery and superior mesenteric artery, with interest to the length of the colon as the suspected site of lower GI bleeding demonstrates no target for embolization. Replaced right hepatic artery IMPRESSION: Status post ultrasound guided access right common femoral artery, with mesenteric angiogram performed of the length of the colon identifying no target for possible embolization. Celt deployed for hemostasis. Signed, Dulcy Fanny. Nadene Rubins, RPVI Vascular and Interventional Radiology Specialists [facility] Radiology Electronically Signed   By: Corrie Mckusick D.O.   On: 11/12/2021 09:51   IR Angiogram Visceral Selective  Result Date: 11/12/2021 INDICATION: 88 year old male with lower GI bleeding, acute on chronic renal failure. EXAM: ULTRASOUND-GUIDED ACCESS RIGHT COMMON FEMORAL ARTERY MESENTERIC ANGIOGRAM CELT DEVICE FOR CLOSURE MEDICATIONS: None ANESTHESIA/SEDATION: Moderate (conscious) sedation was employed during this procedure. A total of Versed 1.0 mg and Fentanyl 50 mcg was administered intravenously by the radiology nurse. Total intra-service moderate Sedation Time: 34 minutes. The patient's level of consciousness and vital signs were monitored continuously by radiology nursing throughout the procedure under my direct supervision. CONTRAST:  40 cc FLUOROSCOPY: Radiation Exposure Index (as provided by the fluoroscopic device): 341 mGy Kerma COMPLICATIONS: None PROCEDURE: Informed consent was obtained from the patient following explanation of the procedure, risks, benefits and alternatives. The patient understands, agrees and consents for the procedure. All questions were addressed. A time out was performed prior to the initiation of the procedure. Maximal barrier sterile technique utilized including caps, mask, sterile gowns, sterile gloves, large sterile drape, hand hygiene, and Betadine prep. Ultrasound  survey of the right inguinal region was performed with images stored and sent to PACs, confirming patency of the vessel. A micropuncture needle  was used access the right common femoral artery under ultrasound. With excellent arterial blood flow returned, and an .018 micro wire was passed through the needle, observed enter the abdominal aorta under fluoroscopy. The needle was removed, and a micropuncture sheath was placed over the wire. The inner dilator and wire were removed, and an 035 Bentson wire was advanced under fluoroscopy into the abdominal aorta. The sheath was removed and a standard 5 [place] vascular sheath was placed. The dilator was removed and the sheath was flushed. Mickelson catheter was advanced on the Bentson wire and formed within the lower thoracic aorta. Mickelson catheter was then used to engage the IMA. Angiogram was performed. Penumbra lantern microcatheter was then advanced on 014 fathom wire into the superior rectal arteries. Angiogram was performed. Catheter was withdrawn, and then advanced into the left colic artery. Angiogram was performed. Catheter was then withdrawn and advanced into the mesenteric artery supplying grip fifths point along the marginal artery of [place]. Angiogram was performed. Microcatheter and microwire were then withdrawn and the Mickelson catheter was used to select the superior mesenteric artery. Angiogram was performed. Catheter was withdrawn slightly such that the tip was engaged into replaced right hepatic artery. Limited angiogram was performed confirming replaced right hepatic artery. All catheters and wires were then removed. A Celt device was deployed for hemostasis. Patient tolerated procedure well and remained hemodynamically stable throughout. No complications and no significant blood loss. FINDINGS: Angiogram of the inferior mesenteric artery and superior mesenteric artery, with interest to the length of the colon as the suspected site of lower GI  bleeding demonstrates no target for embolization. Replaced right hepatic artery IMPRESSION: Status post ultrasound guided access right common femoral artery, with mesenteric angiogram performed of the length of the colon identifying no target for possible embolization. Celt deployed for hemostasis. Signed, Dulcy Fanny. Nadene Rubins, RPVI Vascular and Interventional Radiology Specialists [facility] Radiology Electronically Signed   By: Corrie Mckusick D.O.   On: 11/12/2021 09:51   IR Angiogram Selective Each Additional Vessel  Result Date: 11/12/2021 INDICATION: 88 year old male with lower GI bleeding, acute on chronic renal failure. EXAM: ULTRASOUND-GUIDED ACCESS RIGHT COMMON FEMORAL ARTERY MESENTERIC ANGIOGRAM CELT DEVICE FOR CLOSURE MEDICATIONS: None ANESTHESIA/SEDATION: Moderate (conscious) sedation was employed during this procedure. A total of Versed 1.0 mg and Fentanyl 50 mcg was administered intravenously by the radiology nurse. Total intra-service moderate Sedation Time: 34 minutes. The patient's level of consciousness and vital signs were monitored continuously by radiology nursing throughout the procedure under my direct supervision. CONTRAST:  40 cc FLUOROSCOPY: Radiation Exposure Index (as provided by the fluoroscopic device): 132 mGy Kerma COMPLICATIONS: None PROCEDURE: Informed consent was obtained from the patient following explanation of the procedure, risks, benefits and alternatives. The patient understands, agrees and consents for the procedure. All questions were addressed. A time out was performed prior to the initiation of the procedure. Maximal barrier sterile technique utilized including caps, mask, sterile gowns, sterile gloves, large sterile drape, hand hygiene, and Betadine prep. Ultrasound survey of the right inguinal region was performed with images stored and sent to PACs, confirming patency of the vessel. A micropuncture needle was used access the right common femoral artery under  ultrasound. With excellent arterial blood flow returned, and an .018 micro wire was passed through the needle, observed enter the abdominal aorta under fluoroscopy. The needle was removed, and a micropuncture sheath was placed over the wire. The inner dilator and wire were removed, and an 035 Bentson wire was advanced under  fluoroscopy into the abdominal aorta. The sheath was removed and a standard 5 [place] vascular sheath was placed. The dilator was removed and the sheath was flushed. Mickelson catheter was advanced on the Bentson wire and formed within the lower thoracic aorta. Mickelson catheter was then used to engage the IMA. Angiogram was performed. Penumbra lantern microcatheter was then advanced on 014 fathom wire into the superior rectal arteries. Angiogram was performed. Catheter was withdrawn, and then advanced into the left colic artery. Angiogram was performed. Catheter was then withdrawn and advanced into the mesenteric artery supplying grip fifths point along the marginal artery of [place]. Angiogram was performed. Microcatheter and microwire were then withdrawn and the Mickelson catheter was used to select the superior mesenteric artery. Angiogram was performed. Catheter was withdrawn slightly such that the tip was engaged into replaced right hepatic artery. Limited angiogram was performed confirming replaced right hepatic artery. All catheters and wires were then removed. A Celt device was deployed for hemostasis. Patient tolerated procedure well and remained hemodynamically stable throughout. No complications and no significant blood loss. FINDINGS: Angiogram of the inferior mesenteric artery and superior mesenteric artery, with interest to the length of the colon as the suspected site of lower GI bleeding demonstrates no target for embolization. Replaced right hepatic artery IMPRESSION: Status post ultrasound guided access right common femoral artery, with mesenteric angiogram performed of the  length of the colon identifying no target for possible embolization. Celt deployed for hemostasis. Signed, Dulcy Fanny. Nadene Rubins, RPVI [facility] Radiology Electronically Signed   By: Corrie Mckusick D.O.   On: 11/12/2021 09:51   US RENAL  Result Date: 11/10/2021 CLINICAL DATA:  Right nephrectomy, AKI EXAM: RENAL / URINARY TRACT ULTRASOUND COMPLETE COMPARISON:  CT abdomen/pelvis 05/14/2021 abdomen 06/13/2018 FINDINGS: Right Kidney: Surgically absent. There is no abnormal lesion seen in the nephrectomy bed. Left Kidney: Renal measurements: 12.8 cm x 5.3 cm x 6.0 cm = volume: 213 mL. Multiple cysts are seen, the largest measuring 6.8 cm x 6.2 cm x 7.1 cm. Parenchymal echogenicity is normal. There is no hydronephrosis. Bladder: The bladder is decompressed. The left ureteral jet is seen. The prostate is enlarged. Other: None. IMPRESSION: 1. Status post right nephrectomy. No sonographic abnormality in the nephrectomy bed. 2. Multiple left renal cysts are again seen. 3. Enlarged prostate. Electronically Signed   By: Valetta Mole M.D.   On: 11/10/2021 13:00    Labs:  CBC: Recent Labs    11/10/21 0925 11/10/21 0935 11/11/21 0903 11/11/21 1837 11/12/21 0235  WBC 12.9*  --  11.2* 12.5* 12.7*  HGB 7.2* 6.5* 7.4* 7.8* 7.5*  HCT 22.6* 19.0* 21.5* 23.6* 22.1*  PLT 221  --  190 167 181    COAGS: Recent Labs    11/10/21 0925  INR 1.4*    BMP: Recent Labs    11/10/21 0925 11/10/21 0935 11/10/21 1735 11/11/21 0903 11/12/21 0235  NA 133* 133* 135 139 141  K 6.6* 6.5* 4.5 4.0 3.7  CL 107 107 107 110 114*  CO2 11*  --  17* 16* 18*  GLUCOSE 295* 281* 327* 337* 239*  BUN 58* 58* 64* 67* 61*  CALCIUM 8.1*  --  7.8* 7.7* 7.5*  CREATININE 3.28* 3.50* 3.41* 3.26* 2.72*  GFRNONAA 17*  --  16* 17* 21*    LIVER FUNCTION TESTS: Recent Labs    11/10/21 0925 11/10/21 1735 11/11/21 0903 11/12/21 0235  BILITOT 1.2 1.1 1.4* 1.0  AST 168* 2,148*  1,624* 645*  ALT 154* 1,418* 1,273* 911*  ALKPHOS 56 53 143* 103  PROT 4.6* 4.3* 4.2* 3.7*  ALBUMIN 2.3* 2.2* 2.2* 2.0*    Assessment and Plan:  88 y.o. male inpatient. History of  PAF ( on eliquis), CKD, chronic anemia, DM, prostate cancer, bladder cancer s/p right sided nephrectomy in march 2023, diverticulitis.  Admitted to [facility] for hematochezia  despite eliquis reversal. Patient was deemed not be a candidate for colonoscopy. IR performed a  limited mesenteric angiogram the length of the colon  on 6.29.23 with no target for embolization found.   Hgb 7.5. RN reported one BM with frank blod and clots today. Lactic acid 2.9, AST 645, ALT 911, total protein 3.7, BUN 61, Cr 2.72/ Patient denies any site is soft with no active bleeding and no appreciable pseudoaneurysm. Dressing is C/D/I.  Patient stable from IR perscpective s/p mesenteric angiogram further plans per Primary Team - GI.  Please call IR with questions or concerns.   Electronically Signed: Jacqualine Mau, NP 11/12/2021, 2:55 PM   I spent a total of 15 Minutes at the patient's bedside AND on the patient's hospital floor or unit, greater than 50% of which was counseling/coordinating care for s/p mesenteric angiogram

## 2021-11-12 NOTE — Progress Notes (Addendum)
Daily Progress Note  Hospital Day: 3  Chief Complaint: GI bleed  Brief History David Steele is a 88 y.o. male with a pmh not limited to PAF on Eliquis, CKD 3, chronic anemia, DM, prostate cancer with possible radiation seed implant several years ago, bladder cancer, RCC s/p R nephrectomy March 2023, diverticular disease. Transferred from [facility] for painless hematochezia. Hgb was 5.7.   Assessment   88 yo male with painless hematochezia on Eliquis with associated worsening of chronic anemia. Bleeding persisted despite reversal of Eliquis.  Given brisk bleeding a colonoscopy wasn't best option. CTA not pursued due to renal function. IR felt bleeding scan would be unreliable in this patient.  Interval history : He underwent a limited mesenteric angiogram last evening but no target for embolization was found.  Spoke with RN. He had a bloody BM during the night and just had another BM with moderate amount of blood with clots.  Received 2 more u PRBCs yesterday. Hgb remained stable overnight 7.8 >> 7.4.     Plan   Though he continues to bleed it does seem to be slowing down. RN will let me know he he has recurrent episodes.    Subjective   He feels okay. Tells me he had blood in stool this am   Objective   Imaging:  US RENAL  Result Date: 11/10/2021 CLINICAL DATA:  Right nephrectomy, AKI EXAM: RENAL / URINARY TRACT ULTRASOUND COMPLETE COMPARISON:  CT abdomen/pelvis 05/14/2021 abdomen 06/13/2018 FINDINGS: Right Kidney: Surgically absent. There is no abnormal lesion seen in the nephrectomy bed. Left Kidney: Renal measurements: 12.8 cm x 5.3 cm x 6.0 cm = volume: 213 mL. Multiple cysts are seen, the largest measuring 6.8 cm x 6.2 cm x 7.1 cm. Parenchymal echogenicity is normal. There is no hydronephrosis. Bladder: The bladder is decompressed. The left ureteral jet is seen. The prostate is enlarged. Other: None. IMPRESSION: 1. Status post right nephrectomy. No  sonographic abnormality in the nephrectomy bed. 2. Multiple left renal cysts are again seen. 3. Enlarged prostate. Electronically Signed   By: Valetta Mole M.D.   On: 11/10/2021 13:00    Lab Results: Recent Labs    11/11/21 0903 11/11/21 1837 11/12/21 0235  WBC 11.2* 12.5* 12.7*  HGB 7.4* 7.8* 7.5*  HCT 21.5* 23.6* 22.1*  PLT 190 167 181   BMET Recent Labs    11/10/21 1735 11/11/21 0903 11/12/21 0235  NA 135 139 141  K 4.5 4.0 3.7  CL 107 110 114*  CO2 17* 16* 18*  GLUCOSE 327* 337* 239*  BUN 64* 67* 61*  CREATININE 3.41* 3.26* 2.72*  CALCIUM 7.8* 7.7* 7.5*   LFT Recent Labs    11/12/21 0235  PROT 3.7*  ALBUMIN 2.0*  AST 645*  ALT 911*  ALKPHOS 103  BILITOT 1.0  BILIDIR 0.2  IBILI 0.8   PT/INR Recent Labs    11/10/21 0925  LABPROT 17.2*  INR 1.4*     Scheduled inpatient medications:   Chlorhexidine Gluconate Cloth  6 each Topical Daily   hydrOXYzine  25 mg Intramuscular Once   insulin aspart  0-9 Units Subcutaneous Q4H   pantoprazole (PROTONIX) IV  40 mg Intravenous Q12H   sodium bicarbonate  1,300 mg Oral BID   sodium chloride flush  3 mL Intravenous Q12H   Continuous inpatient infusions:   magnesium sulfate bolus IVPB     PRN inpatient medications: Muscle Rub  Vital signs in last 24 hours: Temp:  [97.9  F (36.6 C)-99.7 F (37.6 C)] 97.9 F (36.6 C) (06/30 0341) Pulse Rate:  [69-96] 69 (06/30 0341) Resp:  [12-24] 16 (06/30 0341) BP: (96-153)/(45-92) 116/50 (06/30 0341) SpO2:  [89 %-100 %] 100 % (06/30 0341) Weight:  [87 kg] 87 kg (06/29 2333) Last BM Date : 11/11/21  Intake/Output Summary (Last 24 hours) at 11/12/2021 0917 Last data filed at 11/12/2021 0000 Gross per 24 hour  Intake 1210 ml  Output 825 ml  Net 385 ml     Physical Exam:  General: Alert male in NAD. HOH Heart:  Regular rate . No lower extremity edema Pulmonary: Normal respiratory effort Abdomen: Soft, nondistended, nontender. Normal bowel sounds.  Neurologic: Alert  and oriented Psych: Pleasant. Cooperative.    Intake/Output from previous day: 06/29 0701 - 06/30 0700 In: 1230 [P.O.:510; I.V.:30; Blood:690] Out: 825 [Urine:825] Intake/Output this shift: No intake/output data recorded.    Principal Problem:   Diverticulosis of colon with hemorrhage Active Problems:   T2DM (type 2 diabetes mellitus) ([place])   CKD (chronic kidney disease), stage III (HCC)   Paroxysmal atrial fibrillation (HCC)   Anemia   Acute renal failure superimposed on chronic kidney disease ([place])   Ischemic hepatitis     LOS: 2 days   Tye Savoy ,NP 11/12/2021, 9:17 AM  GI ATTENDING  Interval history and data reviewed.  Interval events noted.  Agree with interval progress note as outlined above.  Patient seen and examined.  Patient continued to bleed.  Limited angiography was negative for bleeding source.  Passed clots earlier.  Hemoglobin relatively stable at present.  Continue with aggressive supportive care for likely diverticular bleed.  Docia Chuck. Geri Seminole., M.D. [facility] Division of Gastroenterology

## 2021-11-12 NOTE — Care Management Important Message (Signed)
Important Message  Patient Details  Name: David Steele MRN: [id number] Date of Birth: Sep 27, 1925   Medicare Important Message Given:  Yes     Shelda Altes 11/12/2021, 8:22 AM

## 2021-11-12 NOTE — Progress Notes (Addendum)
Subjective:  The patient is a 88 yo male with a PMH of HTN, HLD, T2DM, CKD IIIa, paroxysmal atrial fibrillation on Eliquis, BPH, and renal cell carcinoma status post right laparoscopic nephrectomy on 07/29/2021 admitted for a lower GI bleed likely secondary to diverticulosis.  Overnight, the patient was very agitated per RN note and was assigned a sitter instead of pharmacological treatment. RN reports 1 small episode of bowel bleeding overnight with a BM compared to 5 larger episodes of bleeding yesterday morning and afternoon. This morning, the patient is able to reply to questions better than he has during previous days in the hospital. He correctly states that he is in a hospital and knows who he is. He also says he wants to go home and that he is feeling a little better. Communication continues to be restricted by the patient's severe sensorineural hearing loss.  Objective:  Vital signs in last 24 hours: Vitals:   11/11/21 1921 11/11/21 2328 11/11/21 2333 11/12/21 0341  BP: (!) 153/67 133/84  (!) 116/50  Pulse: 96 77  69  Resp: '18 15  16  '$ Temp: 98.4 F (36.9 C) 98.8 F (37.1 C)  97.9 F (36.6 C)  TempSrc: Oral Oral  Oral  SpO2: 96% 100%  100%  Weight:   87 kg   Height:       Weight change: 9.48 kg  Intake/Output Summary (Last 24 hours) at 11/12/2021 0707 Last data filed at 11/12/2021 0000 Gross per 24 hour  Intake 1230 ml  Output 825 ml  Net 405 ml   General:  Hard of hearing male wearing safety mittens and lying in bed Cardiovascular:  RRR, no murmurs/rubs/gallops. Normal S1/S2. 2+ radial and pedal pulses bilaterally. Respiratory:  Clear bilaterally to ascultation. Normal WOB on 4L O2 and on room air. Abdominal:  Nontender and nondistended abdomen. Normoactive bowel sounds. Extremities: Capillary refill <2 seconds. Skin:  Pale, warm. No cyanosis. Psych:  Alert and oriented to self and place. Able to respond to basic questions appropriately.     Latest Ref Rng & Units  11/12/2021    2:35 AM 11/11/2021    6:37 PM 11/11/2021    9:03 AM  CBC  WBC 4.0 - 10.5 K/uL 12.7  12.5  11.2   Hemoglobin 13.0 - 17.0 g/dL 7.5  7.8  7.4   Hematocrit 39.0 - 52.0 % 22.1  23.6  21.5   Platelets 150 - 400 K/uL 181  167  190    CMP     Component Value Date/Time   NA 141 11/12/2021 0235   K 3.7 11/12/2021 0235   CL 114 (H) 11/12/2021 0235   CO2 18 (L) 11/12/2021 0235   GLUCOSE 239 (H) 11/12/2021 0235   BUN 61 (H) 11/12/2021 0235   CREATININE 2.72 (H) 11/12/2021 0235   CALCIUM 7.5 (L) 11/12/2021 0235   PROT 3.7 (L) 11/12/2021 0235   ALBUMIN 2.0 (L) 11/12/2021 0235   AST 645 (H) 11/12/2021 0235   ALT 911 (H) 11/12/2021 0235   ALKPHOS 103 11/12/2021 0235   BILITOT 1.0 11/12/2021 0235   GFRNONAA 21 (L) 11/12/2021 0235   GFRAA >60 07/06/2019 0010     CBG (last 3) - downtrending Recent Labs    11/11/21 2101 11/12/21 0126 11/12/21 0508  GLUCAP 283* 239* 192*    Imaging:  Limited mesenteric angiogram w/ 40cc contrast: No target for embolization found in IMA, SMA, or ileocolic artery  Assessment/Plan:  Principal Problem:   Diverticulosis of colon with hemorrhage  Active Problems:   T2DM (type 2 diabetes mellitus) ([place])   CKD (chronic kidney disease), stage III (HCC)   Paroxysmal atrial fibrillation (HCC)   Anemia   Acute renal failure superimposed on chronic kidney disease ([place])   Ischemic hepatitis  David Steele is a 88 y.o. male with a past medical history of CKD3, atherosclerosis, renal cell carcinoma s/p R nephrectomy, IDDM, and A-fib on anticoagulation at home admitted for a glower GI bleed likely secondary to diverticulosis.   Lower GI bleed, suspect diverticulosis Acute on Chronic Symptomatic Anemia  Patient s/p 5 units PRBC (1 unit at outside hospital, 4 units at [facility]) and 1 L LR bolus since admission. Hgb is 7.5 this morning, down from 7.8 yesterday evening but holding steady overall. Given that he received 2 more units of PRBC yesterday as  well as fluids and there has been at least one small episode of rectal blood, the patient clearly continues to bleed but is improving compared to the past few days. GI is still unable to complete colonoscopy due to active bleeding limiting visibility in the colon. IR unable to complete tagged RBC scan due to patient movement but he is s/p limited diagnostic angiogram of his  IMA, SMA and ileocolic artery without target for embolization. Preferred imaging modality of CT with contrast not possible due to CKD. Will continue monitoring the patient and providing supportive care while reassessing for a status change that would allow for further diagnostic imaging. - Maintenance LR IVF - Repeat CBC later today and continue daily CMP - Continue closely monitoring vitals with MAP goal > 65 - Continue supportive care and transfuse more PRBC units if bleeding continues - Trend H&H - GI following and will continue to watch for frequency of bleeding episodes to assess if appropriate to have a colonoscopy and if further transfusions are needed prior to procedure   AKI on CKD 3, improving  Acute Urinary Retention 2/2 BPH Non anion gap metabolic acidosis  Hyperkalemia Patient has underlying kidney dieasese and presents with AKI in the setting of hypovolemia 2/2 GI bleed. Creatinine is downtrending to 2.72 from 3.26 yesterday and GFR is gradually rising to 21 from 17. Bicarb increased to 18 from 16. Foley catheter was placed yesterday (6/29) and urine output has increased markedly since. While AKI etiology was previously believed to be ATN due to hypovolemia, the rapid downtrend in creatinine of approximately 0.5 since yesterday after Foley placement indicates possible outlet obstruction. Increased urine output and improving kidney function labs are reassuring. Hyperkalemia resolved with treatment. - Continue bicarb - Strict I/Os, daily weights - Maintain Foley catheter, placed 6/29 - Start flomax once GI bleeding  stabilizes  - Nephrology onboard and monitoring daily labs, started maintenance IVF  Ischemic hepatitis After initial sharp increase in LFTs, AST and ALT are down to 645 and 911, respectively. These labs are consistent with end organ damage, likely due to previous hypovolemia and current anemia limiting perfusion of the liver. However, the downtrend in values is reassuring and indicates gradual improvement. The goal is to preserve oxygen delivery to the liver by treating underlying causes of the hepatitis. LFTs improved overall today. - Continue monitoring LFTs and transfuse more units PRBC if needed  Paroxysmal A-fib EKG on admission did not show active A-fib at this time. Continuing to hold Eliquis given GI bleed as well as HTN and rate control medications. Will reassess restarting these medications as patient improves. Based on a CHA2DS2-VASc Score of 5, the patient has  a 7.2% stroke risk per year. - Continuous cardiac monitoring - Resume anticoagulation upon discharge if bleed is stopped  Insulin-dependent diabetes mellitus While there is an overall downtrend in CBG values over the past few days, blood glucose is still not at goal of <180. The patient normally takes 25 units regular insulin BID at home, which can be considered if his diet improves during his hospitalization. - Q4 CBG monitoring - Insulin glargine 5 units daily and insulin aspart moderate sliding scale with meals, reassess if patient's eating improves - Goal CBG: <180 while acutely ill and aim for euglycemia once bleeding resolves   LOS: 2 days   David Steele, Medical Student 11/12/2021, 7:07 AM  Pager number: [phone]

## 2021-11-13 DIAGNOSIS — K5791 Diverticulosis of intestine, part unspecified, without perforation or abscess with bleeding: Secondary | ICD-10-CM

## 2021-11-13 DIAGNOSIS — N183 Chronic kidney disease, stage 3 unspecified: Secondary | ICD-10-CM | POA: Diagnosis not present

## 2021-11-13 DIAGNOSIS — E1122 Type 2 diabetes mellitus with diabetic chronic kidney disease: Secondary | ICD-10-CM | POA: Diagnosis not present

## 2021-11-13 DIAGNOSIS — R7989 Other specified abnormal findings of blood chemistry: Secondary | ICD-10-CM | POA: Diagnosis not present

## 2021-11-13 DIAGNOSIS — D62 Acute posthemorrhagic anemia: Secondary | ICD-10-CM | POA: Diagnosis not present

## 2021-11-13 DIAGNOSIS — Z7985 Long-term (current) use of injectable non-insulin antidiabetic drugs: Secondary | ICD-10-CM

## 2021-11-13 DIAGNOSIS — K5731 Diverticulosis of large intestine without perforation or abscess with bleeding: Secondary | ICD-10-CM | POA: Diagnosis not present

## 2021-11-13 DIAGNOSIS — N179 Acute kidney failure, unspecified: Secondary | ICD-10-CM | POA: Diagnosis not present

## 2021-11-13 LAB — CBC
HCT: 19.6 % — ABNORMAL LOW (ref 39.0–52.0)
Hemoglobin: 6.7 g/dL — CL (ref 13.0–17.0)
MCH: 29.1 pg (ref 26.0–34.0)
MCHC: 34.2 g/dL (ref 30.0–36.0)
MCV: 85.2 fL (ref 80.0–100.0)
Platelets: 166 10*3/uL (ref 150–400)
RBC: 2.3 MIL/uL — ABNORMAL LOW (ref 4.22–5.81)
RDW: 17.6 % — ABNORMAL HIGH (ref 11.5–15.5)
WBC: 10.7 10*3/uL — ABNORMAL HIGH (ref 4.0–10.5)
nRBC: 0.3 % — ABNORMAL HIGH (ref 0.0–0.2)

## 2021-11-13 LAB — RENAL FUNCTION PANEL
Albumin: 2.1 g/dL — ABNORMAL LOW (ref 3.5–5.0)
Anion gap: 8 (ref 5–15)
BUN: 41 mg/dL — ABNORMAL HIGH (ref 8–23)
CO2: 20 mmol/L — ABNORMAL LOW (ref 22–32)
Calcium: 7.8 mg/dL — ABNORMAL LOW (ref 8.9–10.3)
Chloride: 113 mmol/L — ABNORMAL HIGH (ref 98–111)
Creatinine, Ser: 1.89 mg/dL — ABNORMAL HIGH (ref 0.61–1.24)
GFR, Estimated: 33 mL/min — ABNORMAL LOW (ref >60)
Glucose, Bld: 249 mg/dL — ABNORMAL HIGH (ref 70–99)
Phosphorus: 2.9 mg/dL (ref 2.5–4.6)
Potassium: 3.5 mmol/L (ref 3.5–5.1)
Sodium: 141 mmol/L (ref 135–145)

## 2021-11-13 LAB — GLUCOSE, CAPILLARY
Glucose-Capillary: 168 mg/dL — ABNORMAL HIGH (ref 70–99)
Glucose-Capillary: 190 mg/dL — ABNORMAL HIGH (ref 70–99)
Glucose-Capillary: 222 mg/dL — ABNORMAL HIGH (ref 70–99)
Glucose-Capillary: 240 mg/dL — ABNORMAL HIGH (ref 70–99)
Glucose-Capillary: 243 mg/dL — ABNORMAL HIGH (ref 70–99)
Glucose-Capillary: 259 mg/dL — ABNORMAL HIGH (ref 70–99)

## 2021-11-13 LAB — MAGNESIUM: Magnesium: 1.7 mg/dL (ref 1.7–2.4)

## 2021-11-13 LAB — PREPARE RBC (CROSSMATCH)

## 2021-11-13 LAB — HEMOGLOBIN AND HEMATOCRIT, BLOOD
HCT: 21.2 % — ABNORMAL LOW (ref 39.0–52.0)
Hemoglobin: 7.1 g/dL — ABNORMAL LOW (ref 13.0–17.0)

## 2021-11-13 MED ORDER — SODIUM CHLORIDE 0.9% IV SOLUTION: Freq: Once | INTRAVENOUS | Status: AC

## 2021-11-13 MED ORDER — INSULIN GLARGINE-YFGN 100 UNIT/ML ~~LOC~~ SOLN
10.0000 [IU] | Freq: Every day | SUBCUTANEOUS | Status: DC
  Administered 2021-11-13 – 2021-11-15 (×3): 10 [IU] via SUBCUTANEOUS
  Filled 2021-11-13 (×4): qty 0.1

## 2021-11-13 MED ORDER — HYDRALAZINE HCL 10 MG PO TABS
10.0000 mg | ORAL_TABLET | Freq: Once | ORAL | Status: AC
  Administered 2021-11-13: 10 mg via ORAL
  Filled 2021-11-13: qty 1

## 2021-11-13 MED ORDER — ACETAMINOPHEN 325 MG PO TABS
650.0000 mg | ORAL_TABLET | Freq: Four times a day (QID) | ORAL | Status: AC | PRN
  Administered 2021-11-13 – 2021-11-16 (×2): 650 mg via ORAL
  Filled 2021-11-13 (×2): qty 2

## 2021-11-13 MED ORDER — LACTATED RINGERS IV SOLN
INTRAVENOUS | Status: DC
Start: 2021-11-13 — End: 2021-11-16

## 2021-11-13 MED ORDER — GUAIFENESIN ER 600 MG PO TB12
600.0000 mg | ORAL_TABLET | Freq: Two times a day (BID) | ORAL | Status: DC | PRN
Start: 2021-11-13 — End: 2021-11-17
  Administered 2021-11-13 – 2021-11-17 (×3): 600 mg via ORAL
  Filled 2021-11-13 (×3): qty 1

## 2021-11-13 NOTE — Progress Notes (Signed)
Subjective:   Summary: David Steele is a 88 y.o. year old male currently admitted on the IMTS HD#3 for LGIB likely 2/2 diverticulosis.  Overnight Events:  -3 occurrences of small stool since 1800 yesterday all described as "red", "mucous" -critical hgb 6.7 this AM prompting transfusion -urine output 1.6L per cath -patient displaying improved responsiveness to questions, hearing aids now present, interacts well with family and our team -reports improving appetite, reduced pain, abdominal distension     Objective:  Vital signs in last 24 hours: Vitals:   11/13/21 0000 11/13/21 0015 11/13/21 0400 11/13/21 0406  BP:  (!) 159/86  (!) 144/60  Pulse: 80 87 81 84  Resp: '12 16 18 18  '$ Temp:  98.7 F (37.1 C)  98.4 F (36.9 C)  TempSrc:  Oral  Oral  SpO2: 98% 98% 99% 98%  Weight:  86.5 kg    Height:       Supplemental O2: Room Air SpO2: 98 % O2 Flow Rate (L/min): 4 L/min   Physical Exam:  Constitutional: well-appearing man sitting in bed, in no acute distress, interactive and responding to questions Cardiovascular: RRR, no murmurs, rubs or gallops Pulmonary/Chest: normal work of breathing on room air, lungs clear to auscultation bilaterally Abdominal: soft, non-tender, moderately distended,  Skin: warm and dry, skin turgor normal Extremities: upper/lower extremity pulses 1+, capillary refill 3s, no lower extremity edema present  Filed Weights   11/11/21 0410 11/11/21 2333 11/13/21 0015  Weight: 85.2 kg 87 kg 86.5 kg     Intake/Output Summary (Last 24 hours) at 11/13/2021 0621 Last data filed at 11/13/2021 0545 Gross per 24 hour  Intake 2047.35 ml  Output 1451 ml  Net 596.35 ml   Net IO Since Admission: 3,167.25 mL [11/13/21 0621]  Pertinent Labs:    Latest Ref Rng & Units 11/12/2021    2:35 AM 11/11/2021    6:37 PM 11/11/2021    9:03 AM  CBC  WBC 4.0 - 10.5 K/uL 12.7  12.5  11.2   Hemoglobin 13.0 - 17.0 g/dL 7.5  7.8  7.4   Hematocrit 39.0 -  52.0 % 22.1  23.6  21.5   Platelets 150 - 400 K/uL 181  167  190        Latest Ref Rng & Units 11/12/2021    2:35 AM 11/11/2021    9:03 AM 11/10/2021    5:35 PM  CMP  Glucose 70 - 99 mg/dL 239  337  327   BUN 8 - 23 mg/dL 61  67  64   Creatinine 0.61 - 1.24 mg/dL 2.72  3.26  3.41   Sodium 135 - 145 mmol/L 141  139  135   Potassium 3.5 - 5.1 mmol/L 3.7  4.0  4.5   Chloride 98 - 111 mmol/L 114  110  107   CO2 22 - 32 mmol/L '18  16  17   '$ Calcium 8.9 - 10.3 mg/dL 7.5  7.7  7.8   Total Protein 6.5 - 8.1 g/dL 3.7  4.2  4.3   Total Bilirubin 0.3 - 1.2 mg/dL 1.0  1.4  1.1   Alkaline Phos 38 - 126 U/L 103  143  53   AST 15 - 41 U/L 645  1,624  2,148   ALT 0 - 44 U/L 911  1,273  1,418    Last 3 BG collection: 259, 243, 233   Imaging: I  personally reviewed interval imaging and my interpretation is as follows:  Renal US 6/28: IMPRESSION: 1. Status post right nephrectomy. No sonographic abnormality in the nephrectomy bed. 2. Multiple left renal cysts are again seen. 3. Enlarged prostate.  IR Angiogram 6/29: Limited evaluation of IMA, SMA, with no target for embolization Incidentally reported replaced R hepatic artery    Assessment/Plan:   Principal Problem:   Diverticulosis of colon with hemorrhage Active Problems:   T2DM (type 2 diabetes mellitus) ([place])   CKD (chronic kidney disease), stage III (HCC)   Paroxysmal atrial fibrillation (HCC)   Anemia   Acute renal failure superimposed on chronic kidney disease ([place])   Ischemic hepatitis   Patient Summary: David Steele is a 88 y.o. with a pertinent PMH of HTN, prostate cancer s\p radiation seed treatment several years ago, RCC s\p right nephrectomy, Paroxysmal Afib on eliquis, T2DM, diverticulosis, who presented with rectal bleeding and admitted for lower GI bleed likely 2/2 diverticulosis.    #LGIB likely 2/2 diverticulosis #Acute on chronic IDA Presented with prolonged rectal bleeding lasting a few days and worsening  weakness/abdominal pain. No contrasted CT obtained d/t CKD, patient initially agitated and unable to cooperate with tagged RBC scan, IR angiogram of IMA/SMA did not find targets for embolization. Diverticulosis noted on prior imaging likely cause of LGIB. Bleeding continues s\p 6 units pRBC during the admission, but patient more interactive today and reporting less abdominal pain, but moderate distension on exam. Plan: -Continue maintenance LR IVF -daily CBC -MAP goal >65 -s\p 6 units pRBC, transfuse if Hgb<7, trend H&H -GI on board for possible colonoscopy, appreciate recommendations -continue pantoprazole '40mg'$  BID   #AKI on CKD3 (improving) #Urinary Retention 2/2 BPH #NAGMA #RCC s\p R nephrectomy #Prostate Cancer s\p radiation treatment AKI likely precipitated by volume loss in setting of LGIB with urinary retention as well. Cr this AM 1.89 from 2.72. Baseline unclear following R nephrectomy but ranged from 1.1-1.5 prior. Resolving with Foley in place. Might benefit from flomax in future pending stabilization of BP and resolution of GIB. Plan: -Cr downtrending after foley, CTM -continue maintenance LR IVF -consider flomax -nephrology on board, appreciate recommendations -hold home lisinopril  #PAF on eliquis Eliquis reversed in setting of LGIB. Afib not visualized on Ekg upon admission. CHADSVASC 5. Plan: -hold eliquis, discuss risks and benefits of restarting with family closer to d/c  #Shock Liver 2/2 LGIB AST/ALT elevated on admission, peak AST 1624, ALT 1273. Downtrending most recently AST 645, ALT 911. Will CTM for symptoms, resolution. Plan: -Daily CMP  #T2DM Currently on 5 units long-acting and 5 units short-acting w/ meals. Last 3 BG range from 240-259. Will increase long-acting to tighten glucose control. Plan: -Increase Semglee to 10 units QHS  Diet: Clear Liquids IVF: LR,100cc/hr VTE: SCDs Code: DNR PT/OT recs: None, none.Pending TOC recs:  Family Update:  Family updated at bedside   Dispo: discharge plan pending PT/OT recs and resolution of LGIB, AKI, Shock liver.  Linus Galas, MD PGY-1 Internal Medicine Resident Please contact the on call pager after 5 pm and on weekends at [phone].

## 2021-11-13 NOTE — Progress Notes (Signed)
Nephrology Follow-Up Consult note   Assessment/Recommendations: David Steele is a/an 88 y.o. male with a past medical history significant for HTN, HLD, DM2, afib who present w/ GI bleeding c/b AKI and hyperkalemia   AKI on CKD 3b: BL Crt at least 1.8 after nephrectomy but baseline may be even higher; no recent outpatient labs.  Creatinine was 2.4 at the outside hospital before transfer.  Most likely some degree of ATN related to anemia and possible hypotension.  Creatinine is now improved to what is likely his baseline of 1.9 -Finish out IV fluids today -Acidosis and hyperkalemia management as below -Continue to monitor daily Cr, Dose meds for GFR -Monitor Daily I/Os, Daily weight  -Maintain MAP>65 for optimal renal perfusion.  -Avoid nephrotoxic medications including NSAIDs -Use synthetic opioids (Fentanyl/Dilaudid) if needed -Discussed with family patient is not a dialysis candidate -Hold home lisinopril  Given the patient's improving renal function we will sign off at this time.   GI bleeding: GI involved.  Likely diverticular bleed on anticoagulation.  Angiogram w/ IR w/o target.  Continue transfusions as needed   Hyperkalemia: Now resolved with medical treatment   Metabolic acidosis: Significantly improved with bicarbonate of 20.  Stop oral sodium bicarbonate   Atrial fibrillation: Holding home medications   History of RCC: Status post right nephrectomy in March 2023   Uncontrolled type 2 diabetes with hyperglycemia: Management per primary team  Elevation of LFTs: AST/ALT increasing. Possibly some degree of shock. Improving  Delirium: multifactorial. Mgmt per primary  Given the patient's improving kidney function we will sign off at this time.  Please do not hesitate to call us if further help is needed     Recommendations conveyed to primary service.    [facility] 11/13/2021 8:48  AM  ___________________________________________________________  CC: AKI, GI bleeding  Interval History/Subjective: Patient resting this morning with no complaints.  Creatinine continues to improve.  Urine output has been good.  Hemoglobin remains low.   Medications:  Current Facility-Administered Medications  Medication Dose Route Frequency Provider Last Rate Last Admin   0.9 %  sodium chloride infusion (Manually program via Guardrails IV Fluids)   Intravenous Once Linus Galas, MD       Chlorhexidine Gluconate Cloth 2 % PADS 6 each  6 each Topical Daily Velna Ochs, MD   6 each at 11/12/21 1651   hydrOXYzine (VISTARIL) injection 25 mg  25 mg Intramuscular Once Gaylan Gerold, DO       insulin aspart (novoLOG) injection 0-15 Units  0-15 Units Subcutaneous TID WC Masters, Katie, DO   5 Units at 11/12/21 1655   insulin glargine-yfgn (SEMGLEE) injection 5 Units  5 Units Subcutaneous QHS Masters, Katie, DO   5 Units at 11/12/21 2141   lactated ringers infusion   Intravenous Continuous Reesa Chew, MD 100 mL/hr at 11/13/21 0640 New Bag at 11/13/21 0640   Muscle Rub CREA   Topical PRN Velna Ochs, MD   1 Application at 44/31/54 0556   pantoprazole (PROTONIX) injection 40 mg  40 mg Intravenous Q12H Candee Furbish, MD   40 mg at 11/12/21 2142   sodium bicarbonate tablet 1,300 mg  1,300 mg Oral BID Reesa Chew, MD   1,300 mg at 11/12/21 0086   sodium chloride flush (NS) 0.9 % injection 3 mL  3 mL Intravenous Zettie Cooley, MD   3 mL at 11/12/21 2142      Review of Systems: Unable to obtain due to the  patient's AMS  Physical Exam: Vitals:   11/13/21 0406 11/13/21 0826  BP: (!) 144/60   Pulse: 84 85  Resp: 18   Temp: 98.4 F (36.9 C) 98 F (36.7 C)  SpO2: 98%    No intake/output data recorded.  Intake/Output Summary (Last 24 hours) at 11/13/2021 0848 Last data filed at 11/13/2021 9518 Gross per 24 hour  Intake 2112.32 ml  Output 1650 ml  Net  462.32 ml   Constitutional: Lying in bed, no distress ENMT: ears and nose without scars or lesions, MMM CV: normal rate, no edema Respiratory: Bilateral chest rise, normal work of breathing Gastrointestinal: soft, non-tender, no palpable masses or hernias Skin: no visible lesions or rashes Psych: Tired, intermittently interactive, difficulty hearing, agitated   Test Results I personally reviewed new and old clinical labs and radiology tests Lab Results  Component Value Date   NA 141 11/13/2021   K 3.5 11/13/2021   CL 113 (H) 11/13/2021   CO2 20 (L) 11/13/2021   BUN 41 (H) 11/13/2021   CREATININE 1.89 (H) 11/13/2021   GFR 82.39 09/26/2011   CALCIUM 7.8 (L) 11/13/2021   ALBUMIN 2.1 (L) 11/13/2021   PHOS 2.9 11/13/2021    CBC Recent Labs  Lab 11/10/21 0925 11/10/21 0935 11/11/21 1837 11/12/21 0235 11/13/21 0632  WBC 12.9*   < > 12.5* 12.7* 10.7*  NEUTROABS 10.9*  --   --   --   --   HGB 7.2*   < > 7.8* 7.5* 6.7*  HCT 22.6*   < > 23.6* 22.1* 19.6*  MCV 89.3   < > 85.5 85.0 85.2  PLT 221   < > 167 181 166   < > = values in this interval not displayed.

## 2021-11-13 NOTE — Progress Notes (Addendum)
HISTORY OF PRESENT ILLNESS:  David Steele is a 88 y.o. male admitted with significant painless hematochezia.  Was on anticoagulation therapy.  Selective angiography was negative for bleeding site.  He is also had elevated transaminases, likely due to hypoperfusion.  Nursing reports that he had 3 bowel movements with blood last evening.  None since.  Hemoglobin this morning 6.7.  He was given 1 unit of blood.  He is resting comfortably in the bed.  REVIEW OF SYSTEMS:  All non-GI ROS negative. Past Medical History:  Diagnosis Date   Acute respiratory failure with hypoxia ([place]) 07/03/2019   Advanced age 26/10/2018   Anemia    Anxiety    Aortic atherosclerosis (HCC)    Benign essential HTN 02/23/2017   Bladder cancer (HCC)    BPH (benign prostatic hyperplasia)    Cerebral ischemia    CKD (chronic kidney disease), stage III ([place]) 07/03/2019   Current use of long term anticoagulation 04/19/2019   Depressive disorder    Diabetes mellitus type 2, uncontrolled, with complications 31/54/0086   DOE (dyspnea on exertion) 02/23/2017   Dyslipidemia 07/01/2019   First degree AV block 12/14/2017   Former smoker 06/15/2017   History of bladder cancer 02/23/2017   HLD (hyperlipidemia) 07/03/2019   Hyperlipidemia    Hypertension    Orthostatic hypotension 08/31/2010   Paroxysmal atrial fibrillation ([place]) 02/23/2017   Paroxysmal ventricular tachycardia ([place]) 05/19/2020   Plantar fasciitis    Pneumonia due to COVID-19 virus 07/01/2019   Profound sensorineural hearing loss (SNHL) 11/13/2017   Formatting of this note might be different from the original. Added automatically from request for surgery [id number]   Prostate cancer ([place]) 07/03/2019   Sinus bradycardia 02/23/2017   T2DM (type 2 diabetes mellitus) ([place]) 07/01/2019    Past Surgical History:  Procedure Laterality Date   BLADDER SURGERY  2007   Cancerous tumor removed   EYE SURGERY     IR ANGIOGRAM SELECTIVE EACH ADDITIONAL VESSEL   11/11/2021   IR ANGIOGRAM SELECTIVE EACH ADDITIONAL VESSEL  11/11/2021   IR ANGIOGRAM SELECTIVE EACH ADDITIONAL VESSEL  11/11/2021   IR ANGIOGRAM VISCERAL SELECTIVE  11/11/2021   IR ANGIOGRAM VISCERAL SELECTIVE  11/11/2021   IR US GUIDE VASC ACCESS RIGHT  11/11/2021   TONSILLECTOMY      Social History Jony Ladnier  reports that he quit smoking about 33 years ago. His smoking use included cigarettes. He has never used smokeless tobacco. He reports that he does not drink alcohol and does not use drugs.  family history is not on file.  Allergies  Allergen Reactions   Simvastatin Other (See Comments)    Leg pain       PHYSICAL EXAMINATION: Vital signs: BP (!) 166/65   Pulse 70   Temp 98.2 F (36.8 C) (Oral)   Resp 20   Ht '5\' 11"'$  (1.803 m)   Wt 86.5 kg   SpO2 98%   BMI 26.60 kg/m   Constitutional: Elderly gentleman, resting, no acute distress Psychiatric: Resting Abdomen: soft, nontender, nondistended, no obvious ascites, no peritoneal signs, normal bowel sounds, no organomegaly Neuro: No focal deficits.   ASSESSMENT:  1.  Painless hematochezia.  Likely diverticular bleed.  No further bleeding since last night.  Negative selective angiography as noted 2.  Advanced age and multiple medical problems 3.  Acute blood loss anemia.  Being transfused x1 unit. 4.  Elevated liver tests.  Likely shock liver.  Improving  PLAN:  1.  Continue supportive care 2.  Transfuse  as needed 3.  We will follow.  Docia Chuck. Geri Seminole., M.D. [facility] Division of Gastroenterology

## 2021-11-13 NOTE — Progress Notes (Signed)
Patient's blood pressures have been in the 170's-190's since 1900. Patient receiving 1 unit of PRBCs. Patient denies chest pain, discomfort, or dyspnea. Patient resting and is without complaints. Notified Internal Medicine. New orders received.

## 2021-11-13 NOTE — Progress Notes (Signed)
   11/13/21 0700  Provider Notification  Provider Name/Title Dr.Sriamkumar  Date Provider Notified 11/13/21  Time Provider Notified 0725  Method of Notification Page  Notification Reason Critical result  Test performed and critical result HGB 6.7  Date Critical Result Received 11/13/21  Time Critical Result Received 0715  Provider response Other (Comment)  Date of Provider Response 11/13/21  Time of Provider Response [phone]

## 2021-11-14 DIAGNOSIS — K625 Hemorrhage of anus and rectum: Secondary | ICD-10-CM | POA: Diagnosis not present

## 2021-11-14 DIAGNOSIS — K5731 Diverticulosis of large intestine without perforation or abscess with bleeding: Secondary | ICD-10-CM | POA: Diagnosis not present

## 2021-11-14 DIAGNOSIS — I480 Paroxysmal atrial fibrillation: Secondary | ICD-10-CM

## 2021-11-14 DIAGNOSIS — Z87891 Personal history of nicotine dependence: Secondary | ICD-10-CM

## 2021-11-14 DIAGNOSIS — K7200 Acute and subacute hepatic failure without coma: Secondary | ICD-10-CM | POA: Diagnosis not present

## 2021-11-14 DIAGNOSIS — R7989 Other specified abnormal findings of blood chemistry: Secondary | ICD-10-CM

## 2021-11-14 DIAGNOSIS — N179 Acute kidney failure, unspecified: Secondary | ICD-10-CM | POA: Diagnosis not present

## 2021-11-14 DIAGNOSIS — N183 Chronic kidney disease, stage 3 unspecified: Secondary | ICD-10-CM

## 2021-11-14 DIAGNOSIS — D62 Acute posthemorrhagic anemia: Secondary | ICD-10-CM | POA: Diagnosis not present

## 2021-11-14 LAB — BPAM RBC
Blood Product Expiration Date: 202307122359
Blood Product Expiration Date: 202307132359
Blood Product Expiration Date: 202307132359
Blood Product Expiration Date: 202307172359
Blood Product Expiration Date: 202307172359
Blood Product Expiration Date: 202307182359
ISSUE DATE / TIME: 202306281204
ISSUE DATE / TIME: 202306282222
ISSUE DATE / TIME: 202306291114
ISSUE DATE / TIME: 202306291343
ISSUE DATE / TIME: 202307010926
ISSUE DATE / TIME: 202307011940
Unit Type and Rh: 600
Unit Type and Rh: 600
Unit Type and Rh: 600
Unit Type and Rh: 600
Unit Type and Rh: 600
Unit Type and Rh: 600

## 2021-11-14 LAB — COMPREHENSIVE METABOLIC PANEL
ALT: 496 U/L — ABNORMAL HIGH (ref 0–44)
AST: 160 U/L — ABNORMAL HIGH (ref 15–41)
Albumin: 2 g/dL — ABNORMAL LOW (ref 3.5–5.0)
Alkaline Phosphatase: 93 U/L (ref 38–126)
Anion gap: 7 (ref 5–15)
BUN: 26 mg/dL — ABNORMAL HIGH (ref 8–23)
CO2: 24 mmol/L (ref 22–32)
Calcium: 7.4 mg/dL — ABNORMAL LOW (ref 8.9–10.3)
Chloride: 112 mmol/L — ABNORMAL HIGH (ref 98–111)
Creatinine, Ser: 1.63 mg/dL — ABNORMAL HIGH (ref 0.61–1.24)
GFR, Estimated: 39 mL/min — ABNORMAL LOW (ref >60)
Glucose, Bld: 165 mg/dL — ABNORMAL HIGH (ref 70–99)
Potassium: 3.1 mmol/L — ABNORMAL LOW (ref 3.5–5.1)
Sodium: 143 mmol/L (ref 135–145)
Total Bilirubin: 0.8 mg/dL (ref 0.3–1.2)
Total Protein: 3.9 g/dL — ABNORMAL LOW (ref 6.5–8.1)

## 2021-11-14 LAB — GLUCOSE, CAPILLARY
Glucose-Capillary: 150 mg/dL — ABNORMAL HIGH (ref 70–99)
Glucose-Capillary: 147 mg/dL — ABNORMAL HIGH (ref 70–99)
Glucose-Capillary: 293 mg/dL — ABNORMAL HIGH (ref 70–99)
Glucose-Capillary: 158 mg/dL — ABNORMAL HIGH (ref 70–99)

## 2021-11-14 LAB — TYPE AND SCREEN
ABO/RH(D): A NEG
Antibody Screen: NEGATIVE
Unit division: 0
Unit division: 0
Unit division: 0
Unit division: 0
Unit division: 0
Unit division: 0

## 2021-11-14 LAB — CBC
HCT: 23.3 % — ABNORMAL LOW (ref 39.0–52.0)
Hemoglobin: 8.1 g/dL — ABNORMAL LOW (ref 13.0–17.0)
MCH: 28.8 pg (ref 26.0–34.0)
MCHC: 34.8 g/dL (ref 30.0–36.0)
MCV: 82.9 fL (ref 80.0–100.0)
Platelets: 144 10*3/uL — ABNORMAL LOW (ref 150–400)
RBC: 2.81 MIL/uL — ABNORMAL LOW (ref 4.22–5.81)
RDW: 16.6 % — ABNORMAL HIGH (ref 11.5–15.5)
WBC: 8.1 10*3/uL (ref 4.0–10.5)
nRBC: 0.6 % — ABNORMAL HIGH (ref 0.0–0.2)

## 2021-11-14 LAB — MAGNESIUM: Magnesium: 1.5 mg/dL — ABNORMAL LOW (ref 1.7–2.4)

## 2021-11-14 MED ORDER — POTASSIUM CHLORIDE CRYS ER 20 MEQ PO TBCR
40.0000 meq | EXTENDED_RELEASE_TABLET | Freq: Once | ORAL | Status: AC
  Administered 2021-11-14: 40 meq via ORAL
  Filled 2021-11-14: qty 2

## 2021-11-14 MED ORDER — MAGNESIUM SULFATE 2 GM/50ML IV SOLN
2.0000 g | Freq: Once | INTRAVENOUS | Status: AC
  Administered 2021-11-14: 2 g via INTRAVENOUS
  Filled 2021-11-14: qty 50

## 2021-11-14 MED ORDER — SODIUM CHLORIDE 0.9% IV SOLUTION: Freq: Once | INTRAVENOUS | Status: AC

## 2021-11-14 MED ORDER — TAMSULOSIN HCL 0.4 MG PO CAPS
0.4000 mg | ORAL_CAPSULE | Freq: Every day | ORAL | Status: DC
  Administered 2021-11-14 – 2021-11-17 (×4): 0.4 mg via ORAL
  Filled 2021-11-14 (×4): qty 1

## 2021-11-14 MED ORDER — CARVEDILOL 6.25 MG PO TABS
6.2500 mg | ORAL_TABLET | Freq: Two times a day (BID) | ORAL | Status: DC
  Administered 2021-11-14 – 2021-11-15 (×3): 6.25 mg via ORAL
  Filled 2021-11-14 (×3): qty 1

## 2021-11-14 MED ORDER — PANTOPRAZOLE 2 MG/ML SUSPENSION
40.0000 mg | Freq: Every day | ORAL | Status: DC
  Administered 2021-11-14 – 2021-11-15 (×2): 40 mg via ORAL
  Filled 2021-11-14 (×3): qty 20

## 2021-11-14 NOTE — Progress Notes (Signed)
Subjective:  Tyreque Finken is a 88 yo male with a PMH of HTN, HLD, T2DM, CKD IIIa, paroxysmal atrial fibrillation on Eliquis, diverticulosis, BPH, and renal cell carcinoma status post right laparoscopic nephrectomy on 07/29/2021 presenting with rectal bleeding and admitted for a lower GI bleed likely secondary to diverticulosis.  This morning, the patient is responsive and able to answer questions appropriately. He does not have his hearing aid today, unfortunately, complicating communication. He states that the hearing aid was "too loud." The patient says he is feeling better and he has less abdominal pain. He states that he still feels somewhat distended. He has not had any bowel movements since yesterday.  Overnight events: - 1 unit PRBC finished last night - No bloody BMs last night or this morning - Systolic BPs were in the 176H around 2200 and he was given 10 mg hydralazine  Objective:  Vital signs in last 24 hours: Vitals:   11/14/21 0217 11/14/21 0400 11/14/21 0429 11/14/21 0934  BP: (!) 168/60 (!) 167/55  (!) 181/80  Pulse: 68 71  83  Resp: '17 12  14  '$ Temp:  98.7 F (37.1 C)  98.1 F (36.7 C)  TempSrc:  Oral  Oral  SpO2: 96% 94%  95%  Weight:   88.7 kg   Height:        Intake/Output Summary (Last 24 hours) at 11/14/2021 Last data filed at 11/14/2021 0701 Gross per 24 hour  Intake 2486.7 ml  Output 2100 ml  Net +386.7 ml   General:  Elderly male resting in bed, appears comfortable and in no acute distress Cardiovascular:  RRR, no murmurs/rubs/gallops. Normal S1/S2. Radial and pedal pulses 2+ bilaterally. Respiratory:  Lungs CTAB. No rales or crackles. Normal WOB on room air. Abdominal:  Mild diffuse distension. Nontender to palpation. Normoactive bowel sounds. Extremities:  No cyanosis or edema. Normal ROM in all 4 extremities. Skin:  Warm, dry. Psych:  Alert and oriented x3. Appropriate.     Latest Ref Rng & Units 11/14/2021    2:16 AM 11/13/2021    2:01 PM  11/13/2021    6:32 AM  CBC  WBC 4.0 - 10.5 K/uL 8.1   10.7   Hemoglobin 13.0 - 17.0 g/dL 8.1  7.1  6.7   Hematocrit 39.0 - 52.0 % 23.3  21.2  19.6   Platelets 150 - 400 K/uL 144   166    CMP     Component Value Date/Time   NA 143 11/14/2021 0216   K 3.1 (L) 11/14/2021 0216   CL 112 (H) 11/14/2021 0216   CO2 24 11/14/2021 0216   GLUCOSE 165 (H) 11/14/2021 0216   BUN 26 (H) 11/14/2021 0216   CREATININE 1.63 (H) 11/14/2021 0216   CALCIUM 7.4 (L) 11/14/2021 0216   PROT 3.9 (L) 11/14/2021 0216   ALBUMIN 2.0 (L) 11/14/2021 0216   AST 160 (H) 11/14/2021 0216   ALT 496 (H) 11/14/2021 0216   ALKPHOS 93 11/14/2021 0216   BILITOT 0.8 11/14/2021 0216   GFRNONAA 39 (L) 11/14/2021 0216   GFRAA >60 07/06/2019 0010   CBG (last 3)  Recent Labs    11/13/21 2034 11/14/21 0755 11/14/21 1114  GLUCAP 168* 150* 147*    Imaging:  No new studies.  Assessment/Plan:  Principal Problem:   Diverticulosis of colon with hemorrhage Active Problems:   T2DM (type 2 diabetes mellitus) (HCC)   CKD (chronic kidney disease), stage III (HCC)   Paroxysmal atrial fibrillation (HCC)   Anemia  Acute renal failure superimposed on chronic kidney disease ([place])   Ischemic hepatitis  Jaleil Renwick is a 88 yo male with a PMH of HTN, HLD, T2DM, CKD IIIa, paroxysmal atrial fibrillation on Eliquis, diverticulosis, BPH, and renal cell carcinoma status post right laparoscopic nephrectomy on 07/29/2021 presenting with rectal bleeding and admitted for a lower GI bleed likely secondary to diverticulosis.  Lower GI bleed, suspect diverticulosis Acute on Chronic Symptomatic Anemia  Patient s/p 7 units PRBC (1 unit at outside hospital, 6 units at [facility]). Hgb is 8.1 this morning, which is the highest since admission. Platelets have been trending gradually lower, likely in the context of repeat PRBC transfusion without contained coagulation factors. Lack of bleeding overnight is encouraging. GI not able to complete  colonoscopy due to active bleeding obstructing view. IR unable to complete tagged RBC scan due to patient agitation and inability to remain still for >3.5 hours. He is s/p limited diagnostic angiogram of his IMA, SMA and ileocolic artery without target for embolization. Preferred imaging modality of CT with contrast not possible due to CKD. Will continue monitoring the patient and providing supportive care while reassessing need/opportunity for diagnostic imaging. - Daily CBC and CMP - Continue closely monitoring vitals with MAP goal > 65 - Transfuse 1 unit FFP to replenish platelets - Continue supportive care and transfuse more PRBC units if bleeding continues, trend H&H - Discontinue IV pantoprazole and start oral pantoprazole 40 mg QID - Advance diet as tolerated to carb modified regular  - GI following, appreciate their continued recommendations - Do not recommend colonoscopy at this time as it would be purely diagnostic with no therapeutic benefit and significant risks - Feel likely etiology is diverticular bleed that will resolve and not resume bleeding after resolution - Recommended advancing diet   AKI on CKD 3 (improving)  Acute Urinary Retention 2/2 BPH Non anion gap metabolic acidosis (resolved) Hyperkalemia (resolved) Patient has underlying kidney dieasese and presents with AKI in the setting of hypovolemia 2/2 GI bleed. Creatinine is continuing to trend down to 1.63 (baseline unclear but likely around 1.6) and GFR is gradually rising to 36. Bicarb increased to 24 and acidosis has resolved with treatment. Foley catheter was placed on 6/29 and urine output since has increased. While AKI etiology was previously believed to be ATN due to hypovolemia, the rapid downtrend in creatinine of since Foley placement indicates contributing outlet obstruction, possibly due to BPH. Will treat BPH and do a voiding trial in near future. Increased urine output and improving labs are reassuring. - Start  Flomax 0.4 mg QID - Remove Foley (placed 6/29) for voiding trial tomorrow - Strict I/Os, daily weights - Nephrology signed off 7/1 due to improving kidney function   HTN The patient takes lisinopril at home, but it has been held during admission pending resolution of GI bleed. He received 10 mg of hydralazine last night for systolic HTN >676, however some HTN is permissive until bleeding resolves. Will begin carvedilol to prevent severe hypertension during hospitalization. - Start carvedilol 6.25 mg BID - Permissive HTN until LGIB resolves, but restart home meds at discharge  Shock liver 2/2 LGIB After initial sharp increase in LFTs, AST and ALT are continuing to trend down, indicating healing of any ischemic damage to liver. The goal is to preserve oxygen delivery to the liver by treating underlying causes of the hepatitis such as anemia and any hypovolemia. LFTs continue to improve overall today. - Continue monitoring LFTs daily  Paroxysmal A-fib EKG  on admission did not show active A-fib. Continuing to hold Eliquis given GI bleed. Will reassess restarting these medications as patient improves. CHA2DS2-VASc Score of 5. - Continuous cardiac monitoring - Discuss risks/benefits of restarting Eliquis with family prior to discharge  Insulin-dependent diabetes mellitus Blood glucose has continued gradually trending down and is now at goal of <180, ranging from 147-168 in the last 12 hours. The patient normally takes 25 units regular insulin BID at home, which can be considered if his diet improves during this hospitalization. - Q4 CBG monitoring - Insulin glargine 10 units daily and insulin aspart 5 units with meals - Goal CBG <180 while acutely ill and aim for euglycemia once bleeding resolves  Diet: Clear Liquids advance as tolerated to carb modified regular IVF: d/c VTE: SCDs Code: DNR PT/OT recs: None, none.   LOS: 4 days   Kreg Earhart, Medical Student 11/14/2021, 11:00  AM  Pager number: [phone]

## 2021-11-14 NOTE — Progress Notes (Signed)
HISTORY OF PRESENT ILLNESS:  David Steele is a 88 y.o. male admitted with significant lower GI bleed most consistent with diverticular bleed.  Thankfully, has been stable over the past 24 hours.  Stools are more brown.  He looks better today.  Sitting up in bed.  No new issues.  Hemoglobin went from 7.1-8.1 after 1 unit of blood.  Liver tests continue to improve.  REVIEW OF SYSTEMS:  All non-GI ROS negative.  Past Medical History:  Diagnosis Date   Acute respiratory failure with hypoxia ([place]) 07/03/2019   Advanced age 86/10/2018   Anemia    Anxiety    Aortic atherosclerosis (HCC)    Benign essential HTN 02/23/2017   Bladder cancer (HCC)    BPH (benign prostatic hyperplasia)    Cerebral ischemia    CKD (chronic kidney disease), stage III ([place]) 07/03/2019   Current use of long term anticoagulation 04/19/2019   Depressive disorder    Diabetes mellitus type 2, uncontrolled, with complications 47/01/6282   DOE (dyspnea on exertion) 02/23/2017   Dyslipidemia 07/01/2019   First degree AV block 12/14/2017   Former smoker 06/15/2017   History of bladder cancer 02/23/2017   HLD (hyperlipidemia) 07/03/2019   Hyperlipidemia    Hypertension    Orthostatic hypotension 08/31/2010   Paroxysmal atrial fibrillation ([place]) 02/23/2017   Paroxysmal ventricular tachycardia ([place]) 05/19/2020   Plantar fasciitis    Pneumonia due to COVID-19 virus 07/01/2019   Profound sensorineural hearing loss (SNHL) 11/13/2017   Formatting of this note might be different from the original. Added automatically from request for surgery [id number]   Prostate cancer ([place]) 07/03/2019   Sinus bradycardia 02/23/2017   T2DM (type 2 diabetes mellitus) ([place]) 07/01/2019    Past Surgical History:  Procedure Laterality Date   BLADDER SURGERY  2007   Cancerous tumor removed   EYE SURGERY     IR ANGIOGRAM SELECTIVE EACH ADDITIONAL VESSEL  11/11/2021   IR ANGIOGRAM SELECTIVE EACH ADDITIONAL VESSEL  11/11/2021   IR  ANGIOGRAM SELECTIVE EACH ADDITIONAL VESSEL  11/11/2021   IR ANGIOGRAM VISCERAL SELECTIVE  11/11/2021   IR ANGIOGRAM VISCERAL SELECTIVE  11/11/2021   IR US GUIDE VASC ACCESS RIGHT  11/11/2021   TONSILLECTOMY      Social History David Steele  reports that he quit smoking about 33 years ago. His smoking use included cigarettes. He has never used smokeless tobacco. He reports that he does not drink alcohol and does not use drugs.  family history is not on file.  Allergies  Allergen Reactions   Simvastatin Other (See Comments)    Leg pain       PHYSICAL EXAMINATION: Vital signs: BP (!) 159/61   Pulse 62   Temp 98.4 F (36.9 C) (Oral)   Resp 16   Ht '5\' 11"'$  (1.803 m)   Wt 88.7 kg   SpO2 94%   BMI 27.27 kg/m   Constitutional: Elderly male, hard of hearing, no acute distress Psychiatric: alert and oriented x3, cooperative Eyes: Anicteric Abdomen: soft, nontender, nondistended, no obvious ascites, no peritoneal signs, normal bowel sounds, no organomegaly   ASSESSMENT:  1.  Significant acute lower GI bleed.  Most consistent with diverticular bleed.  Bleeding is stopped.  Hemoglobin stable. 2.  Elevated liver test.  Most likely shock liver with transient hypotension during bleeding episode.  Improving. 3.  Multiple significant medical problems 4.  Had been on Eliquis for atrial fibrillation at time of admission.  Being held.  I agree.   PLAN:  1.  Advance diet 2.  Continue to monitor stools and blood counts 3.  No plans for colonoscopy.  The risk outweighs the benefit, as this would almost certainly be purely diagnostic.  We are available for questions or problems.  I have discussed this with the primary service. We will sign off.  Thanks  Docia Chuck. Geri Seminole., M.D. [facility] Division of Gastroenterology

## 2021-11-14 NOTE — Plan of Care (Signed)
  Problem: Education: Goal: Ability to describe self-care measures that may prevent or decrease complications (Diabetes Survival Skills Education) will improve Outcome: Progressing   Problem: Coping: Goal: Ability to adjust to condition or change in health will improve Outcome: Progressing   Problem: Fluid Volume: Goal: Ability to maintain a balanced intake and output will improve Outcome: Progressing   

## 2021-11-15 DIAGNOSIS — D500 Iron deficiency anemia secondary to blood loss (chronic): Secondary | ICD-10-CM | POA: Diagnosis not present

## 2021-11-15 DIAGNOSIS — K5731 Diverticulosis of large intestine without perforation or abscess with bleeding: Secondary | ICD-10-CM | POA: Diagnosis not present

## 2021-11-15 LAB — COMPREHENSIVE METABOLIC PANEL
ALT: 324 U/L — ABNORMAL HIGH (ref 0–44)
AST: 81 U/L — ABNORMAL HIGH (ref 15–41)
Albumin: 2.3 g/dL — ABNORMAL LOW (ref 3.5–5.0)
Alkaline Phosphatase: 97 U/L (ref 38–126)
Anion gap: 5 (ref 5–15)
BUN: 16 mg/dL (ref 8–23)
CO2: 23 mmol/L (ref 22–32)
Calcium: 7.7 mg/dL — ABNORMAL LOW (ref 8.9–10.3)
Chloride: 109 mmol/L (ref 98–111)
Creatinine, Ser: 1.54 mg/dL — ABNORMAL HIGH (ref 0.61–1.24)
GFR, Estimated: 42 mL/min — ABNORMAL LOW (ref >60)
Glucose, Bld: 154 mg/dL — ABNORMAL HIGH (ref 70–99)
Potassium: 3.1 mmol/L — ABNORMAL LOW (ref 3.5–5.1)
Sodium: 137 mmol/L (ref 135–145)
Total Bilirubin: 0.8 mg/dL (ref 0.3–1.2)
Total Protein: 4.4 g/dL — ABNORMAL LOW (ref 6.5–8.1)

## 2021-11-15 LAB — CBC
HCT: 26.4 % — ABNORMAL LOW (ref 39.0–52.0)
Hemoglobin: 8.6 g/dL — ABNORMAL LOW (ref 13.0–17.0)
MCH: 27.5 pg (ref 26.0–34.0)
MCHC: 32.6 g/dL (ref 30.0–36.0)
MCV: 84.3 fL (ref 80.0–100.0)
Platelets: 160 10*3/uL (ref 150–400)
RBC: 3.13 MIL/uL — ABNORMAL LOW (ref 4.22–5.81)
RDW: 16.7 % — ABNORMAL HIGH (ref 11.5–15.5)
WBC: 8.1 10*3/uL (ref 4.0–10.5)
nRBC: 0 % (ref 0.0–0.2)

## 2021-11-15 LAB — PREPARE FRESH FROZEN PLASMA

## 2021-11-15 LAB — MAGNESIUM: Magnesium: 1.6 mg/dL — ABNORMAL LOW (ref 1.7–2.4)

## 2021-11-15 LAB — GLUCOSE, CAPILLARY
Glucose-Capillary: 175 mg/dL — ABNORMAL HIGH (ref 70–99)
Glucose-Capillary: 239 mg/dL — ABNORMAL HIGH (ref 70–99)
Glucose-Capillary: 157 mg/dL — ABNORMAL HIGH (ref 70–99)
Glucose-Capillary: 233 mg/dL — ABNORMAL HIGH (ref 70–99)

## 2021-11-15 LAB — BPAM FFP
Blood Product Expiration Date: 202307042359
ISSUE DATE / TIME: 202307021145
Unit Type and Rh: 6200

## 2021-11-15 MED ORDER — CARVEDILOL 12.5 MG PO TABS
12.5000 mg | ORAL_TABLET | Freq: Two times a day (BID) | ORAL | Status: DC
  Administered 2021-11-15 – 2021-11-16 (×2): 12.5 mg via ORAL
  Filled 2021-11-15 (×2): qty 1

## 2021-11-15 MED ORDER — MAGNESIUM SULFATE 2 GM/50ML IV SOLN
2.0000 g | Freq: Once | INTRAVENOUS | Status: AC
  Administered 2021-11-15: 2 g via INTRAVENOUS
  Filled 2021-11-15: qty 50

## 2021-11-15 MED ORDER — POTASSIUM CHLORIDE CRYS ER 20 MEQ PO TBCR
40.0000 meq | EXTENDED_RELEASE_TABLET | Freq: Two times a day (BID) | ORAL | Status: AC
Start: 2021-11-15 — End: 2021-11-15
  Administered 2021-11-15 (×2): 40 meq via ORAL
  Filled 2021-11-15 (×2): qty 2

## 2021-11-15 MED ORDER — ALUM & MAG HYDROXIDE-SIMETH 200-200-20 MG/5ML PO SUSP
30.0000 mL | ORAL | Status: DC | PRN
  Administered 2021-11-15: 30 mL via ORAL
  Filled 2021-11-15 (×2): qty 30

## 2021-11-15 NOTE — Progress Notes (Signed)
Subjective: David Steele is a 88 y.o. male with a past medical history of HTN, HLD, CKD3, atherosclerosis, renal cell carcinoma s/p R nephrectomy, IDDM, and paroxysmal A-fib on Eliquis anticoagulation at home admitted 6/28 for 3-4 days of lower GI bleed likely secondary to diverticulosis.  Overnight events: - Switched to [employer] because he is eating periodically now - No bleeding overnight, patient calm and night generally uneventful per RN report  This morning, the patient's ability to converse continues to improve. He answers all questions and correctly identifies that he is in [facility] and why he is there. He denies abdominal pain or distension as well as SOB. He reports that he is not in pain right now except a little lower extremity soreness. He states that he has been feeling more hungry recently and that he is happy he is improving. He reports that he did not sleep well last night and was feeling confused overnight, forgetting where he was repeatedly. He is able to sit up in bed without assistance by pulling himself up using the guardrails. He fumbles with his small hearing aid device when asked to do so and says that things are "too loud" when he wears it. The patient also complains of a burning sensation on the bottom of his feet bilaterally. He reports that his feet generally feel numb as he walks around throughout the day and states that his sensation over his feet is reduced. He states that he has been using [employer] skin cream at home and it has been helping the burning sensation over his feet.  Objective: Vital signs in last 24 hours: Vitals:   11/15/21 0500 11/15/21 0816 11/15/21 1111 11/15/21 1155  BP:  (!) 150/67 (!) 168/74 (!) 171/62  Pulse:  62 73 68  Resp:    16  Temp:    98 F (36.7 C)  TempSrc:    Oral  SpO2:  94% 96% 99%  Weight: 82.8 kg     Height:       Intake/Output Summary (Last 24 hours) at 11/15/2021 0651 Last data filed at 11/15/2021 0000 Gross  per 24 hour  Intake 879 ml  Output 2050 ml  Net -1171 ml   General:  Elderly male resting comfortably in bed, in no acute distress. HEENT: Hard of hearing, MMM. Cardiovascular:  RRR, no murmurs/rubs/gallops. Respiratory:  CTAB. Normal WOB on room air. Abdominal:  Nontender to palpation. Nondistended abdomen. Normoactive bowel sounds. Extremities:  No edema or cyanosis. 2+ radial and pedal pulses bilaterally. Skin:  Warm, dry. No rashes. Neuro: Inability to sense touch over any toes bilaterally. Unable to identify exact location of touch on sole of foot, but able to sense when sole of foot is being touched. Psych:  Alert and oriented x3. Pleasant and appropriate.     Latest Ref Rng & Units 11/15/2021    7:14 AM 11/14/2021    2:16 AM 11/13/2021    2:01 PM  CBC  WBC 4.0 - 10.5 K/uL 8.1  8.1    Hemoglobin 13.0 - 17.0 g/dL 8.6  8.1  7.1   Hematocrit 39.0 - 52.0 % 26.4  23.3  21.2   Platelets 150 - 400 K/uL 160  144      CMP     Component Value Date/Time   NA 137 11/15/2021 0714   K 3.1 (L) 11/15/2021 0714   CL 109 11/15/2021 0714   CO2 23 11/15/2021 0714   GLUCOSE 154 (H) 11/15/2021 5170  BUN 16 11/15/2021 0714   CREATININE 1.54 (H) 11/15/2021 0714   CALCIUM 7.7 (L) 11/15/2021 0714   PROT 4.4 (L) 11/15/2021 0714   ALBUMIN 2.3 (L) 11/15/2021 0714   AST 81 (H) 11/15/2021 0714   ALT 324 (H) 11/15/2021 0714   ALKPHOS 97 11/15/2021 0714   BILITOT 0.8 11/15/2021 0714   GFRNONAA 42 (L) 11/15/2021 0714   GFRAA >60 07/06/2019 0010   Magnesium: 1.6 (L)  CBG (last 3)  Recent Labs    11/14/21 1929 11/15/21 0643 11/15/21 1110  GLUCAP 158* 175* 239*   Imaging:  No new imaging studies.  Assessment/Plan: Principal Problem:   Diverticulosis of colon with hemorrhage Active Problems:   T2DM (type 2 diabetes mellitus) (HCC)   CKD (chronic kidney disease), stage III (HCC)   Paroxysmal atrial fibrillation (HCC)   Anemia   Acute renal failure superimposed on chronic kidney disease  (HCC)   Ischemic hepatitis   Shock liver   Elevated liver function tests  David Steele is a 88 yo male with a PMH of HTN, HLD, T2DM, CKD IIIa, paroxysmal atrial fibrillation on Eliquis, diverticulosis, BPH, and renal cell carcinoma status post right laparoscopic nephrectomy on 07/29/2021 presenting with rectal bleeding and admitted for a lower GI bleed likely secondary to diverticulosis.  Overall, the patient appears to be improving clinically and will soon be ready for discharge. His mental status has dramatically improved and his labs are all trending in the right direction. PT/OT have been consulted to meet with the patient for discharge recs.   #1 Lower GI bleed, suspect diverticulosis (improving) Acute on Chronic Symptomatic Anemia  Patient s/p 7 units PRBC and 1 unit FFP. Hgb is 8.6 this morning, which is the highest since admission and continues to trend upwards. Platelets have normalized following FFP transfusion yesterday. Previously, he was gradually becoming thrombocytopenic in the context of repeat PRBC transfusions. Lack of rectal bleeding overnight as well as the upward-trending hemoglobin is encouraging. It appears that the patient's bleed has stopped and further imaging is unnecessary at this time for any diagnostic purposes. - Daily CBC and CMP - Continue closely monitoring vitals with MAP goal > 65 - Continue oral pantoprazole 40 mg QID - GI signed off, appreciate their assistance throughout this admission. Final recs provided on 7/2 listed below: - Did not recommend colonoscopy at this time as it would be purely diagnostic with no therapeutic benefit and significant risks - Feel most likely bleed etiology was diverticular bleed that is unlikely to spontaneously recur after resolution  AKI on CKD 3 (improving) Acute Urinary Retention 2/2 BPH Hypokalemia Hypomagnesemia Patient has underlying kidney dieasese and presents with AKI in the setting of hypovolemia 2/2 GI bleed.  Creatinine is continuing to trend down to 1.54 (baseline unclear but likely around 1.4-1.6) and GFR is gradually rising to 42. Foley catheter was placed on 6/29 and urine output since has increased. After initiating Flomax on 7/2, the Foley should be removed and a voiding trial should be done 7/3. While AKI etiology was previously believed to be ATN due to hypovolemia, the rapid downtrend in creatinine since Foley placement indicates outlet obstruction, possibly due to BPH. Stable urine output and improving labs are reassuring. The patient is hypokalemic to 3.1 today and should be repleted with potassium chloride PO tablets. To ensure that potassium remains WNL after repletion, magnesium should continue to be repleted as well. - Continue Flomax 0.4 mg QID - Remove Foley (placed 6/29) for voiding trial - KCl CR  tablet 40 mEq BID for 1 day - Magnesium sulfate 2 g IV bolus x1 - Strict I/Os, daily weights - Nephrology signed off 7/1 due to consistently improving kidney function   HTN The patient continues to be hypertensive with systolic blood pressures ranging from 160-180.  The patient takes lisinopril at home, but it has been held during admission pending resolution of GI bleed. Given his CKD, elevated creatinine, and electrolyte abnormalities, lisinopril is not the ideal medication for management of his HTN. Will watch blood pressures to see if increasing carvedilol dose is sufficient for HTN control. If carvedilol is not sufficient alone, amlodipine can be added outpatient. Alternatively, if the patient becomes bradycardic on the increased dose of carvedilol, a lower dose of carvedilol can be used with the amlodipine together as well. - Increase carvedilol to 12.5 mg BID, but watch for bradycardia; consider adding amlodipine if blood pressures continue to remain hypertensive  Paroxysmal A-fib Held Eliquis throughout admission given GI bleed. Will reassess restarting these medications as patient  improves, but must weight anticoagulation benefits for stroke prevention against the bleeding risks in this 88 year old. Given his age, HLD, HTN, A-fib, and bleeding risk as contraindication to restarting Eliquis, the patient has a high risk of stroke. CHA2DS2-VASc Score of 5 indicates a 7.2% risk of stroke in the next year. - Discuss risks/benefits of restarting Eliquis with family prior to discharge -carvedilol 12.5 mg BID contributing to rate-control    Shock liver 2/2 LGIB After initial sharp increase in LFTs, AST and ALT are continuing to trend down, indicating liver recovery. - Continue monitoring LFTs daily  Insulin-dependent diabetes mellitus Blood glucose has continued gradually trending down and is now mostly at goal of <180, ranging from 158-239 in the last 12 hours. The patient normally takes 25 units regular insulin BID at home, which he should resume upon discharge with the goal of euglycemia at home. - QID ACHS CBG monitoring - Insulin glargine 10 units daily and insulin aspart 5 units with meals - Goal CBG <180 while acutely ill and aim for euglycemia once bleeding resolves  Diet: Clear Liquids advance as tolerated to carb modified regular IVF: d/c VTE: SCDs Code: DNR PT/OT recs: Pending   LOS: 5 days   Shitarev, Dimitry, Medical Student 11/15/2021, 10:45 AM  Pager number: [phone]  I have reviewed the note by Dimitry Shitarev MS 3 and was present during the interview and physical exam.  I agree with the findings, assessment, and plan.  Linus Galas, MD 11/15/2021, 3:20 PM IM Resident, PGY-2 Pager: [phone] Isaiah 41:10

## 2021-11-16 DIAGNOSIS — K5731 Diverticulosis of large intestine without perforation or abscess with bleeding: Secondary | ICD-10-CM | POA: Diagnosis not present

## 2021-11-16 DIAGNOSIS — D500 Iron deficiency anemia secondary to blood loss (chronic): Secondary | ICD-10-CM | POA: Diagnosis not present

## 2021-11-16 LAB — GLUCOSE, CAPILLARY
Glucose-Capillary: 290 mg/dL — ABNORMAL HIGH (ref 70–99)
Glucose-Capillary: 176 mg/dL — ABNORMAL HIGH (ref 70–99)
Glucose-Capillary: 226 mg/dL — ABNORMAL HIGH (ref 70–99)
Glucose-Capillary: 244 mg/dL — ABNORMAL HIGH (ref 70–99)

## 2021-11-16 LAB — COMPREHENSIVE METABOLIC PANEL
ALT: 238 U/L — ABNORMAL HIGH (ref 0–44)
AST: 50 U/L — ABNORMAL HIGH (ref 15–41)
Albumin: 2.2 g/dL — ABNORMAL LOW (ref 3.5–5.0)
Alkaline Phosphatase: 87 U/L (ref 38–126)
Anion gap: 6 (ref 5–15)
BUN: 19 mg/dL (ref 8–23)
CO2: 24 mmol/L (ref 22–32)
Calcium: 7.8 mg/dL — ABNORMAL LOW (ref 8.9–10.3)
Chloride: 107 mmol/L (ref 98–111)
Creatinine, Ser: 1.62 mg/dL — ABNORMAL HIGH (ref 0.61–1.24)
GFR, Estimated: 39 mL/min — ABNORMAL LOW (ref >60)
Glucose, Bld: 214 mg/dL — ABNORMAL HIGH (ref 70–99)
Potassium: 4.2 mmol/L (ref 3.5–5.1)
Sodium: 137 mmol/L (ref 135–145)
Total Bilirubin: 0.8 mg/dL (ref 0.3–1.2)
Total Protein: 4.2 g/dL — ABNORMAL LOW (ref 6.5–8.1)

## 2021-11-16 LAB — CBC
HCT: 25.1 % — ABNORMAL LOW (ref 39.0–52.0)
Hemoglobin: 8.4 g/dL — ABNORMAL LOW (ref 13.0–17.0)
MCH: 28.8 pg (ref 26.0–34.0)
MCHC: 33.5 g/dL (ref 30.0–36.0)
MCV: 86 fL (ref 80.0–100.0)
Platelets: 170 10*3/uL (ref 150–400)
RBC: 2.92 MIL/uL — ABNORMAL LOW (ref 4.22–5.81)
RDW: 16.2 % — ABNORMAL HIGH (ref 11.5–15.5)
WBC: 9.4 10*3/uL (ref 4.0–10.5)
nRBC: 0 % (ref 0.0–0.2)

## 2021-11-16 LAB — CULTURE, BLOOD (ROUTINE X 2)
Culture: NO GROWTH
Culture: NO GROWTH
Special Requests: ADEQUATE
Special Requests: ADEQUATE

## 2021-11-16 MED ORDER — LACTATED RINGERS IV BOLUS
1000.0000 mL | Freq: Once | INTRAVENOUS | Status: AC
  Administered 2021-11-16: 1000 mL via INTRAVENOUS

## 2021-11-16 MED ORDER — AMLODIPINE BESYLATE 2.5 MG PO TABS
2.5000 mg | ORAL_TABLET | Freq: Every day | ORAL | Status: DC
  Administered 2021-11-16 – 2021-11-17 (×2): 2.5 mg via ORAL
  Filled 2021-11-16 (×2): qty 1

## 2021-11-16 MED ORDER — AMLODIPINE BESYLATE 2.5 MG PO TABS
2.5000 mg | ORAL_TABLET | Freq: Every day | ORAL | Status: DC
Start: 2021-11-16 — End: 2021-11-16

## 2021-11-16 MED ORDER — TRAZODONE HCL 50 MG PO TABS
50.0000 mg | ORAL_TABLET | Freq: Every day | ORAL | Status: DC
  Administered 2021-11-16: 50 mg via ORAL
  Filled 2021-11-16: qty 1

## 2021-11-16 MED ORDER — INSULIN GLARGINE-YFGN 100 UNIT/ML ~~LOC~~ SOLN
15.0000 [IU] | Freq: Every day | SUBCUTANEOUS | Status: DC
  Administered 2021-11-16: 15 [IU] via SUBCUTANEOUS
  Filled 2021-11-16 (×2): qty 0.15

## 2021-11-16 MED ORDER — BUSPIRONE HCL 5 MG PO TABS
15.0000 mg | ORAL_TABLET | Freq: Two times a day (BID) | ORAL | Status: DC
  Administered 2021-11-16 – 2021-11-17 (×2): 15 mg via ORAL
  Filled 2021-11-16 (×2): qty 1

## 2021-11-16 MED ORDER — CARVEDILOL 6.25 MG PO TABS
6.2500 mg | ORAL_TABLET | Freq: Two times a day (BID) | ORAL | Status: DC
  Administered 2021-11-16 – 2021-11-17 (×2): 6.25 mg via ORAL
  Filled 2021-11-16 (×2): qty 1

## 2021-11-16 MED ORDER — ACETAMINOPHEN 325 MG PO TABS
650.0000 mg | ORAL_TABLET | Freq: Four times a day (QID) | ORAL | Status: DC | PRN
  Administered 2021-11-16: 650 mg via ORAL
  Filled 2021-11-16: qty 2

## 2021-11-16 MED ORDER — PANTOPRAZOLE SODIUM 40 MG PO TBEC
40.0000 mg | DELAYED_RELEASE_TABLET | Freq: Every day | ORAL | Status: DC
  Administered 2021-11-16 – 2021-11-17 (×2): 40 mg via ORAL
  Filled 2021-11-16: qty 1

## 2021-11-16 NOTE — Evaluation (Signed)
Physical Therapy Evaluation Patient Details Name: David Steele MRN: [id number] DOB: 1926/03/08 Today's Date: 11/16/2021  History of Present Illness  88 y.o. male who presented to [facility] on 11/10/21 with rectal bleeding and weakness.  PMH: covid, hypertension, dyslipidemia, type 2 diabetes mellitus, hypoacusia with cochlear implant, prostate cancer, CKD 3, paroxysmal afib, renal cell carcinoma with R nephrectomy 3/23, diverticulitis.  Clinical Impression  Pt admitted with above diagnosis. Pt from home with his daughter. He reports he has been struggling with "dizzy spells" since his nephrectomy in 3/23. Pt able to mobilize with min A but each time he was up >2 mins he became dizzy and had to sit back down which limited his mobility distance.  BP before session 140/68, sitting during session 126/67, standing at 3 mins 112/49 and pt dizzy. Pt currently with functional limitations due to the deficits listed below (see PT Problem List). Pt will benefit from skilled PT to increase their independence and safety with mobility to allow discharge to the venue listed below.          Recommendations for follow up therapy are one component of a multi-disciplinary discharge planning process, led by the attending physician.  Recommendations may be updated based on patient status, additional functional criteria and insurance authorization.  Follow Up Recommendations Home health PT      Assistance Recommended at Discharge Frequent or constant Supervision/Assistance  Patient can return home with the following  A little help with walking and/or transfers;Assist for transportation;Assistance with cooking/housework    Equipment Recommendations None recommended by PT  Recommendations for Other Services       Functional Status Assessment Patient has had a recent decline in their functional status and demonstrates the ability to make significant improvements in function in a reasonable and predictable amount of  time.     Precautions / Restrictions Precautions Precautions: Fall Precaution Comments: pt has suffered from dizziness last several months Restrictions Weight Bearing Restrictions: No      Mobility  Bed Mobility Overal bed mobility: Needs Assistance Bed Mobility: Supine to Sit     Supine to sit: Supervision     General bed mobility comments: able to come to EOB without asisst from [facility] elevated    Transfers Overall transfer level: Needs assistance Equipment used: Rolling walker (2 wheels) Transfers: Sit to/from Stand Sit to Stand: Min assist           General transfer comment: min A to steady, did better with elevated bed. Not dizzy until he's been up 1-2 mins. Performed sit to stand multiple times with marching fwd and back and in place to see if dizziness would pass but each time it worsened and he needed to sit    Ambulation/Gait Ambulation/Gait assistance: Min assist Gait Distance (Feet): 20 Feet Assistive device: Rolling walker (2 wheels) Gait Pattern/deviations: Step-through pattern Gait velocity: decreased Gait velocity interpretation: 1.31 - 2.62 ft/sec, indicative of limited community ambulator   General Gait Details: did not progress gait further due to dizziness each time he's up more than 2 mins  Stairs            Wheelchair Mobility    Modified Rankin (Stroke Patients Only)       Balance Overall balance assessment: Needs assistance Sitting-balance support: No upper extremity supported, Feet unsupported Sitting balance-Leahy Scale: Fair     Standing balance support: During functional activity, Reliant on assistive device for balance Standing balance-Leahy Scale: Poor Standing balance comment: Pt needs UE support with mobility and transfers.  Pertinent Vitals/Pain Pain Assessment Pain Assessment: Faces Faces Pain Scale: Hurts even more Pain Location: R sided head, accompanies dizziness Pain  Descriptors / Indicators: Aching Pain Intervention(s): Limited activity within patient's tolerance, Monitored during session    Home Living Family/patient expects to be discharged to:: Private residence Living Arrangements: Children Available Help at Discharge: Family;Available 24 hours/day Type of Home: House Home Access: Ramped entrance       Home Layout: One level Home Equipment: Conservation officer, nature (2 wheels);Cane - single point;Shower seat - built in      Prior Function Prior Level of Function : Independent/Modified Independent             Mobility Comments: Used his homemade cane to get around in the house ADLs Comments: Reports being modified independent with selfcare tasks, likes to [employer]     Hand Dominance   Dominant Hand: Right    Extremity/Trunk Assessment   Upper Extremity Assessment Upper Extremity Assessment: Generalized weakness    Lower Extremity Assessment Lower Extremity Assessment: Generalized weakness    Cervical / Trunk Assessment Cervical / Trunk Assessment: Normal  Communication   Communication: HOH (VERY, helps if he can see your mouth)  Cognition Arousal/Alertness: Awake/alert Behavior During Therapy: WFL for tasks assessed/performed Overall Cognitive Status: Within Functional Limits for tasks assessed                                 General Comments: appropriate and can give a good history if he can understand what you're asking        General Comments General comments (skin integrity, edema, etc.): BP 45 mins prior to session with pt in supine 140/58. Sitting 126/67, standing at 3 mins 112/49 and pt dizzy with R sided head pain.    Exercises     Assessment/Plan    PT Assessment Patient needs continued PT services  PT Problem List Decreased strength;Decreased activity tolerance;Decreased mobility;Pain;Decreased knowledge of precautions       PT Treatment Interventions DME instruction;Gait training;Functional mobility  training;Therapeutic activities;Therapeutic exercise;Stair training;Balance training;Patient/family education    PT Goals (Current goals can be found in the Care Plan section)  Acute Rehab PT Goals Patient Stated Goal: stop getting dizzy PT Goal Formulation: With patient Time For Goal Achievement: 11/30/21 Potential to Achieve Goals: Good    Frequency Min 3X/week     Co-evaluation               AM-PAC PT "6 Clicks" Mobility  Outcome Measure Help needed turning from your back to your side while in a flat bed without using bedrails?: None Help needed moving from lying on your back to sitting on the side of a flat bed without using bedrails?: None Help needed moving to and from a bed to a chair (including a wheelchair)?: A Little Help needed standing up from a chair using your arms (e.g., wheelchair or bedside chair)?: A Little Help needed to walk in hospital room?: Total Help needed climbing 3-5 steps with a railing? : Total 6 Click Score: 16    End of Session Equipment Utilized During Treatment: Gait belt Activity Tolerance: Treatment limited secondary to medical complications (Comment) (dizziness ([place])) Patient left: in chair;with call bell/phone within reach;with chair alarm set Nurse Communication: Mobility status PT Visit Diagnosis: Unsteadiness on feet (R26.81);Dizziness and giddiness (R42)    Time: 4008-6761 PT Time Calculation (min) (ACUTE ONLY): 18 min   Charges:   PT Evaluation $PT  Eval Moderate Complexity: [place], PT  Acute Rehab Services Secure chat preferred Office [place] 11/16/2021, 2:09 PM

## 2021-11-16 NOTE — TOC Initial Note (Signed)
Transition of Care [facility]) - Initial/Assessment Note    Patient Details  Name: David Steele MRN: [id number] Date of Birth: 1925-10-13  [facility] CM/SW Contact:    Pollie Friar, RN Phone Number: 11/16/2021, 12:42 PM  Clinical Narrative:                 Patient is from home with his daughter. She oversees his medications but he mainly manages them. He denies any issues.  Pts son provides needed transportation.  Recommendations for home health services. Pt asked to use same agency he had in the past. CM was able to find he used [facility]. Information on the AVS.  Family will transport home once medically ready.  MD please place orders for [facility] prior to d/c.  TOC following.  Expected Discharge Plan: [place] Barriers to Discharge: Continued Medical Work up   Patient Goals and CMS Choice   CMS Medicare.gov Compare Post Acute Care list provided to:: Patient Choice offered to / list presented to : Patient  Expected Discharge Plan and Services Expected Discharge Plan: [place]   Discharge Planning Services: CM Consult Post Acute Care Choice: [place] arrangements for the past 2 months: Single Family Home                           HH Arranged: PT, OT [facility] Agency: [place] Date [place]: 11/16/21   Representative spoke with at [place]: Malachy Mood  Prior Living Arrangements/Services Living arrangements for the past 2 months: [place] Lives with:: Adult Children Patient language and need for interpreter reviewed:: Yes Do you feel safe going back to the place where you live?: Yes      Need for Family Participation in Patient Care: Yes (Comment) Care giver support system in place?: Yes (comment) Current home services: DME (walker/ wheelchair/ cane/ built in shower seat) Criminal Activity/Legal Involvement Pertinent to Current Situation/Hospitalization: No - Comment as  needed  Activities of Daily Living      Permission Sought/Granted                  Emotional Assessment Appearance:: Appears stated age Attitude/Demeanor/Rapport: Engaged Affect (typically observed): Accepting Orientation: : Oriented to Self, Oriented to Place, Oriented to  Time, Oriented to Situation Alcohol / Substance Use: Not Applicable Psych Involvement: No (comment)  Admission diagnosis:  Diverticulosis of colon with hemorrhage [K57.31] Hyperkalemia [E87.5] Rectal bleeding [K62.5] Acute renal failure superimposed on chronic kidney disease, unspecified CKD stage, unspecified acute renal failure type ([place]) [N17.9, N18.9] Patient Active Problem List   Diagnosis Date Noted   Shock liver    Elevated liver function tests    Acute renal failure superimposed on chronic kidney disease ([place])    Ischemic hepatitis    Diverticulosis of colon with hemorrhage 11/10/2021   Rectal bleeding    Acute blood loss anemia    Chronic anticoagulation    Preop cardiovascular exam 07/05/2021   Nonsustained ventricular tachycardia ([place]) 05/19/2020   Hyperlipidemia    Depressive disorder    Cerebral ischemia    BPH (benign prostatic hyperplasia)    Bladder cancer (HCC)    Anxiety    Anemia    Aortic atherosclerosis (HCC)    Acute respiratory failure with hypoxia ([place]) 07/03/2019   Diabetes mellitus type 2, uncontrolled, with complications 52/77/8242   Prostate cancer ([place]) 07/03/2019   CKD (chronic kidney disease),  stage III ([place]) 07/03/2019   HLD (hyperlipidemia) 07/03/2019   Pneumonia due to COVID-19 virus 07/01/2019   T2DM (type 2 diabetes mellitus) ([place]) 07/01/2019   Unspecified hearing loss, unspecified ear 07/01/2019   COVID-19 07/01/2019   Current use of long term anticoagulation 04/19/2019   Advanced age 48/10/2018   First degree AV block 12/14/2017   Profound sensorineural hearing loss (SNHL) 11/13/2017   Former smoker 06/15/2017   Benign essential HTN 02/23/2017   DOE  (dyspnea on exertion) 02/23/2017   History of bladder cancer 02/23/2017   Light headed 02/23/2017   Other fatigue 02/23/2017   Paroxysmal atrial fibrillation ([place]) 02/23/2017   Sinus bradycardia 02/23/2017   Near syncope 02/23/2017   Hypertension 08/31/2010   Orthostatic hypotension 08/31/2010   PCP:  Ernestene Kiel, MD Pharmacy:   [facility] [place], [place] [address] [place] [address] [place] [address] Phone: [phone] Fax: [phone]     Social Determinants of Health (SDOH) Interventions    Readmission Risk Interventions     No data to display

## 2021-11-16 NOTE — Progress Notes (Addendum)
Subjective:   Summary: David Steele is a 88 y.o. year old male currently admitted on the IMTS HD#6 for lower GI bleed likely secondary to diverticulosis.  Overnight Events: - Blood pressures overnight in the 960A systolics, diastolics slightly low after increasing Coreg yesterday.  Informed by nurse of dizziness, lower blood pressures (88/45) while patient was working with PT, as well as bradycardia in the 40s to 50s. Pulled back on Coreg regimen and added 1 L bolus. - Urine output 2 L yesterday, removed Foley catheter and replaced with external catheter - Patient complaining of depressed mood while in the hospital - Patient complaining of burning sensation in his feet    Objective:  Vital signs in last 24 hours: Vitals:   11/16/21 0322 11/16/21 0450 11/16/21 0500 11/16/21 0632  BP: (!) 151/63 (!) 148/61  (!) 154/51  Pulse: 61 64  (!) 57  Resp: 18 18    Temp: 98.3 F (36.8 C) 98 F (36.7 C)    TempSrc: Oral Oral    SpO2: 96% 96%  97%  Weight:   81.7 kg   Height:       Supplemental O2: Room Air    Physical Exam:  General:  Elderly male resting comfortably in bed, in no acute distress. HEENT: Hard of hearing Cardiovascular:  RRR, no murmurs/rubs/gallops. Respiratory:  CTAB. Normal WOB on room air. Abdominal:  Nontender to palpation. Nondistended abdomen. Normoactive bowel sounds. Extremities:  No edema or cyanosis. 2+ radial and pedal pulses bilaterally. Skin:  Warm, dry. No rashes. Neuro: Inability to sense touch over any toes bilaterally. Unable to identify exact location of touch on sole of foot, but able to sense when sole of foot is being touched. Psych:  Alert and oriented x3. Pleasant and appropriate.  Filed Weights   11/14/21 0429 11/15/21 0500 11/16/21 0500  Weight: 88.7 kg 82.8 kg 81.7 kg     Intake/Output Summary (Last 24 hours) at 11/16/2021 0655 Last data filed at 11/16/2021 0536 Gross per 24 hour  Intake 1120 ml  Output 2075 ml   Net -955 ml   Net IO Since Admission: 1,292.93 mL [11/16/21 0655]  Pertinent Labs:    Latest Ref Rng & Units 11/16/2021    2:19 AM 11/15/2021    7:14 AM 11/14/2021    2:16 AM  CBC  WBC 4.0 - 10.5 K/uL 9.4  8.1  8.1   Hemoglobin 13.0 - 17.0 g/dL 8.4  8.6  8.1   Hematocrit 39.0 - 52.0 % 25.1  26.4  23.3   Platelets 150 - 400 K/uL 170  160  144        Latest Ref Rng & Units 11/16/2021    2:19 AM 11/15/2021    7:14 AM 11/14/2021    2:16 AM  CMP  Glucose 70 - 99 mg/dL 214  154  165   BUN 8 - 23 mg/dL '19  16  26   '$ Creatinine 0.61 - 1.24 mg/dL 1.62  1.54  1.63   Sodium 135 - 145 mmol/L 137  137  143   Potassium 3.5 - 5.1 mmol/L 4.2  3.1  3.1   Chloride 98 - 111 mmol/L 107  109  112   CO2 22 - 32 mmol/L '24  23  24   '$ Calcium 8.9 - 10.3 mg/dL 7.8  7.7  7.4   Total Protein 6.5 - 8.1 g/dL 4.2  4.4  3.9   Total Bilirubin 0.3 - 1.2 mg/dL 0.8  0.8  0.8   Alkaline Phos 38 - 126 U/L 87  97  93   AST 15 - 41 U/L 50  81  160   ALT 0 - 44 U/L 238  324  496    Last 3 blood glucose: 290, 214, 233    Assessment/Plan:   Principal Problem:   Diverticulosis of colon with hemorrhage Active Problems:   T2DM (type 2 diabetes mellitus) (HCC)   CKD (chronic kidney disease), stage III (HCC)   Paroxysmal atrial fibrillation (HCC)   Anemia   Acute renal failure superimposed on chronic kidney disease (HCC)   Ischemic hepatitis   Shock liver   Elevated liver function tests   Patient Summary: David Steele is a 88 yo male with a PMH of HTN, HLD, T2DM, CKD IIIa, paroxysmal atrial fibrillation on Eliquis, diverticulosis, BPH, and renal cell carcinoma status post right laparoscopic nephrectomy on 07/29/2021 presenting with rectal bleeding and admitted for a lower GI bleed likely secondary to diverticulosis.   #1 Lower GI bleed, suspect diverticulosis (resolved) Acute on Chronic Symptomatic Anemia  Patient s/p 7 units PRBC and 1 unit FFP. Hgb is 8.4 this morning. Lack of rectal bleeding overnight. It  appears that the patient's bleed has stopped and further imaging is unnecessary at this time for any diagnostic purposes. - Daily CBC and CMP - Continue closely monitoring vitals with MAP goal > 65 - Continue oral pantoprazole 40 mg QID - GI signed off, appreciate their assistance throughout this admission. Final recs provided on 7/2 listed below: - Did not recommend colonoscopy at this time as it would be purely diagnostic with no therapeutic benefit and significant risks - Feel most likely bleed etiology was diverticular bleed that is unlikely to spontaneously recur after resolution   AKI on CKD 3 (improving) Acute Urinary Retention 2/2 BPH Patient has underlying kidney dieasese and presents with AKI in the setting     of hypovolemia 2/2 GI bleed. After initiating Flomax on 7/2, the Foley was removed and a voiding trial done 7/3, subsequent Cr 1.62, likely reflects baseline post right nephrectomy.  - Continue Flomax 0.4 mg QID, CTM Cr during voiding trial - Strict I/Os, daily weights - Nephrology signed off 7/1 due to consistently improving kidney function   HTN Blood pressures stable overnight but low this morning in addition to bradycardia after increasing regimen of Coreg.  Patient also reporting dizziness while attempting to work with PT and informed us about likely chronic orthostatic hypotension. -Reduce carvedilol to 6.25 6.25 mg BID -Add amlodipine 2.5 mg daily -1L LR bolus   Paroxysmal A-fib Held Eliquis throughout admission given GI bleed. Will reassess restarting these medications as patient improves, but must weight anticoagulation benefits for stroke prevention against the bleeding risks in this 88 year old. Given his age, HLD, HTN, A-fib, and bleeding risk as contraindication to restarting Eliquis, the patient has a high risk of stroke. CHA2DS2-VASc Score of 5 indicates a 7.2% risk of stroke in the next year. - Discuss risks/benefits of restarting Eliquis with family prior to  discharge -carvedilol 6.25 mg BID contributing to rate-control   Shock liver 2/2 LGIB After initial sharp increase in LFTs, AST and ALT are continuing to trend down, indicating liver recovery. - Continue monitoring LFTs daily   Insulin-dependent diabetes mellitus Blood glucose has continued gradually trending down and is now mostly at goal of <180, ranging from 214-290 in the last 12 hours.  The patient normally takes 25 units regular insulin BID at home, which he should resume upon discharge with the goal of euglycemia at home. - Insulin glargine 15 units daily and insulin aspart 5 units with meals  Bilateral foot pain Sharp pain in bilateral plantar surfaces of feet as well as numbness likely reflects diabetic neuropathy.  Patient was managing with conservative measures at home, I would hesitate to start treatment with neuropathic pain management (gabapentin, pregabalin, Lyrica) in this patient given age, risks, and comorbidities. - Resume conservative management at home  Diet: Carb-Modified, heart healthy IVF: 1 L LR,Bolus today VTE: SCDs Code: DNR PT/OT recs: Home Health, none. TOC recs: Home health services   Dispo: Anticipated discharge to home pending stabilization of blood pressure, likely tomorrow  Linus Galas, MD PGY-1 Internal Medicine Resident Please contact the on call pager after 5 pm and on weekends at [phone].

## 2021-11-16 NOTE — Care Management Important Message (Signed)
Important Message  Patient Details  Name: David Steele MRN: [id number] Date of Birth: November 18, 1925   Medicare Important Message Given:  Yes     Shelda Altes 11/16/2021, 8:48 AM

## 2021-11-16 NOTE — Evaluation (Signed)
Occupational Therapy Evaluation Patient Details Name: David Steele MRN: [id number] DOB: 05-04-1926 Today's Date: 11/16/2021   History of Present Illness 88 y.o. male who presented to [facility] on 11/10/21 with rectal bleeding and weakness.  PMH: covid, hypertension, dyslipidemia, type 2 diabetes mellitus, hypoacusia with cochlear implant, prostate cancer, CKD 3, paroxysmal afib, renal cell carcinoma with R nephrectomy 3/23, diverticulitis.   Clinical Impression   Pt is currently min assist for transfers with use of the RW for support and simulated selfcare tasks sit to stand.  Feel he will benefit from acute care OT to help increase overall independence with basic ADLs for return home with his daughter who can provide 24 hr supervision.        Recommendations for follow up therapy are one component of a multi-disciplinary discharge planning process, led by the attending physician.  Recommendations may be updated based on patient status, additional functional criteria and insurance authorization.   Follow Up Recommendations  Home health OT    Assistance Recommended at Discharge Frequent or constant Supervision/Assistance (initially)  Patient can return home with the following A little help with walking and/or transfers;A little help with bathing/dressing/bathroom;Assistance with cooking/housework;Assist for transportation;Help with stairs or ramp for entrance    Functional Status Assessment  Patient has had a recent decline in their functional status and demonstrates the ability to make significant improvements in function in a reasonable and predictable amount of time.  Equipment Recommendations  None recommended by OT       Precautions / Restrictions Precautions Precautions: Fall Restrictions Weight Bearing Restrictions: No      Mobility Bed Mobility Overal bed mobility: Needs Assistance Bed Mobility: Sit to Supine       Sit to supine: Supervision        Transfers Overall  transfer level: Needs assistance Equipment used: None Transfers: Sit to/from Stand, Bed to chair/wheelchair/BSC Sit to Stand: Min assist     Step pivot transfers: Mod assist     General transfer comment: Mod assist for transfers without AD but needed only min with use of the RW for support.      Balance Overall balance assessment: Needs assistance Sitting-balance support: No upper extremity supported, Feet unsupported Sitting balance-Leahy Scale: Fair     Standing balance support: During functional activity, Reliant on assistive device for balance Standing balance-Leahy Scale: Poor Standing balance comment: Pt needs UE support with mobility and transfers.                           ADL either performed or assessed with clinical judgement   ADL Overall ADL's : Needs assistance/impaired Eating/Feeding: Independent   Grooming: Wash/dry hands;Wash/dry face;Minimal assistance;Standing Grooming Details (indicate cue type and reason): simulated, pt declined needing to complete tasks Upper Body Bathing: Set up;Sitting Upper Body Bathing Details (indicate cue type and reason): simulated Lower Body Bathing: Minimal assistance;Sit to/from stand   Upper Body Dressing : Set up;Sitting   Lower Body Dressing: Minimal assistance;Sit to/from stand   Toilet Transfer: Moderate assistance (ambulation without an assistive device)   Toileting- Clothing Manipulation and Hygiene: Minimal assistance;Sit to/from stand       Functional mobility during ADLs: Moderate assistance (no device) General ADL Comments: Pt overall mod assist without an assistive device and min to min guard with use of the RW for transfers over to the sink and simulated toilet.  He reports at home he has bars around the house and will use his homemade cane  as well.  Feel he likely needs RW at this time secondary to weakness.     Vision Baseline Vision/History: 3 Glaucoma;1 Wears glasses (glaucoma in the right  eye, reports that he uses reading glasses) Patient Visual Report: No change from baseline Vision Assessment?: No apparent visual deficits     Perception Perception Perception: Within Functional Limits   Praxis Praxis Praxis: Intact    Pertinent Vitals/Pain Pain Assessment Pain Assessment: No/denies pain     Hand Dominance Right   Extremity/Trunk Assessment Upper Extremity Assessment Upper Extremity Assessment: Overall WFL for tasks assessed (slight numbness in digits with decreased FM coordination noted for fastening his gown.)       Cervical / Trunk Assessment Cervical / Trunk Assessment: Normal   Communication Communication Communication: HOH   Cognition Arousal/Alertness: Awake/alert Behavior During Therapy: WFL for tasks assessed/performed Overall Cognitive Status: Within Functional Limits for tasks assessed                                 General Comments: It not oriented to day of the week or day of month but feel his cognition is baseline appropriate for his age.                Home Living Family/patient expects to be discharged to:: Private residence   Available Help at Discharge: Family;Available 24 hours/day Type of Home: House Home Access: Ramped entrance     Home Layout: One level     Bathroom Shower/Tub: Occupational psychologist: Standard Bathroom Accessibility: Yes How Accessible: Accessible via walker Home Equipment: [place] (2 wheels);Cane - single point;Shower seat - built in (son's walker)          Prior Functioning/Environment Prior Level of Function : Independent/Modified Independent             Mobility Comments: Used his homemade cane to get around in the house ADLs Comments: Reports being modified independent with selfcare tasks        OT Problem List: Decreased knowledge of use of DME or AE;Impaired balance (sitting and/or standing)      OT Treatment/Interventions: Self-care/ADL  training;DME and/or AE instruction;Balance training;Therapeutic activities;Patient/family education    OT Goals(Current goals can be found in the care plan section) Acute Rehab OT Goals Patient Stated Goal: Pt wants to go home a soon as possible, gets depressed in the hospital OT Goal Formulation: With patient Time For Goal Achievement: 11/30/21 Potential to Achieve Goals: Good  OT Frequency: Min 2X/week       AM-PAC OT "6 Clicks" Daily Activity     Outcome Measure Help from another person eating meals?: None Help from another person taking care of personal grooming?: A Little Help from another person toileting, which includes using toliet, bedpan, or urinal?: A Little Help from another person bathing (including washing, rinsing, drying)?: A Little Help from another person to put on and taking off regular upper body clothing?: None Help from another person to put on and taking off regular lower body clothing?: A Little 6 Click Score: 20   End of Session Equipment Utilized During Treatment: Rolling walker (2 wheels) Nurse Communication: Mobility status  Activity Tolerance: Patient tolerated treatment well Patient left: in chair;with call bell/phone within reach;with chair alarm set  OT Visit Diagnosis: Unsteadiness on feet (R26.81);Other abnormalities of gait and mobility (R26.89);Muscle weakness (generalized) (M62.81)  Time: 4695-0722 OT Time Calculation (min): 36 min Charges:  OT Evaluation $OT Eval Moderate Complexity: 1 Mod OT Treatments $Self Care/Home Management : 8-22 mins  Jace Dowe,[place] OTR/L 11/16/2021, 9:49 AM

## 2021-11-16 NOTE — Progress Notes (Signed)
Pt called RN into room after pt was sitting up in recliner. Pt stated that he felt dizzy and lightheaded after he walked with PT. BP taken and noted to be soft. Pt reclined back in recliner and monitored. BP rechecked and noted to be WNL. Pt resting in recliner at time. About 63mn later RN called back to room and pt again c/o dizziness. BP again soft, MD paged and new orders received for a bolus. Pt assisted x2 RNs back to bed as pt was adamant to get back in bed. Fall precautions in [facility], [facility]

## 2021-11-17 DIAGNOSIS — D500 Iron deficiency anemia secondary to blood loss (chronic): Secondary | ICD-10-CM | POA: Diagnosis not present

## 2021-11-17 DIAGNOSIS — K5731 Diverticulosis of large intestine without perforation or abscess with bleeding: Secondary | ICD-10-CM | POA: Diagnosis not present

## 2021-11-17 LAB — COMPREHENSIVE METABOLIC PANEL
ALT: 180 U/L — ABNORMAL HIGH (ref 0–44)
AST: 32 U/L (ref 15–41)
Albumin: 2.3 g/dL — ABNORMAL LOW (ref 3.5–5.0)
Alkaline Phosphatase: 82 U/L (ref 38–126)
Anion gap: 4 — ABNORMAL LOW (ref 5–15)
BUN: 19 mg/dL (ref 8–23)
CO2: 27 mmol/L (ref 22–32)
Calcium: 8.1 mg/dL — ABNORMAL LOW (ref 8.9–10.3)
Chloride: 108 mmol/L (ref 98–111)
Creatinine, Ser: 1.68 mg/dL — ABNORMAL HIGH (ref 0.61–1.24)
GFR, Estimated: 38 mL/min — ABNORMAL LOW (ref >60)
Glucose, Bld: 202 mg/dL — ABNORMAL HIGH (ref 70–99)
Potassium: 4.5 mmol/L (ref 3.5–5.1)
Sodium: 139 mmol/L (ref 135–145)
Total Bilirubin: 0.6 mg/dL (ref 0.3–1.2)
Total Protein: 4.4 g/dL — ABNORMAL LOW (ref 6.5–8.1)

## 2021-11-17 LAB — CBC
HCT: 25.4 % — ABNORMAL LOW (ref 39.0–52.0)
Hemoglobin: 8.3 g/dL — ABNORMAL LOW (ref 13.0–17.0)
MCH: 28.2 pg (ref 26.0–34.0)
MCHC: 32.7 g/dL (ref 30.0–36.0)
MCV: 86.4 fL (ref 80.0–100.0)
Platelets: 186 10*3/uL (ref 150–400)
RBC: 2.94 MIL/uL — ABNORMAL LOW (ref 4.22–5.81)
RDW: 15.9 % — ABNORMAL HIGH (ref 11.5–15.5)
WBC: 9.9 10*3/uL (ref 4.0–10.5)
nRBC: 0 % (ref 0.0–0.2)

## 2021-11-17 LAB — GLUCOSE, CAPILLARY
Glucose-Capillary: 166 mg/dL — ABNORMAL HIGH (ref 70–99)
Glucose-Capillary: 291 mg/dL — ABNORMAL HIGH (ref 70–99)

## 2021-11-17 MED ORDER — CARVEDILOL 6.25 MG PO TABS: 6.2500 mg | ORAL_TABLET | Freq: Two times a day (BID) | ORAL | 2 refills | Status: DC

## 2021-11-17 MED ORDER — TAMSULOSIN HCL 0.4 MG PO CAPS: 0.4000 mg | ORAL_CAPSULE | Freq: Every day | ORAL | 2 refills | Status: DC

## 2021-11-17 MED ORDER — AMLODIPINE BESYLATE 2.5 MG PO TABS: 2.5000 mg | ORAL_TABLET | Freq: Every day | ORAL | 2 refills | Status: DC

## 2021-11-17 NOTE — Discharge Instructions (Signed)
It was good seeing your improvement over the course of your admission.  Given your recent severe bleeding it is very important for you to keep an eye on that at home. If you experience any further rectal bleeding or noticed any blood in your stool please go to the ED immediately.  For your atrial fibrillation we are stopping your Eliquis because that can cause or worsen bleeding such as what you experienced during this admission.  For your hypertension we are changing your regimen from lisinopril to carvedilol 6.25 mg twice daily and amlodipine 2.5 mg daily at night.  Please take care monitoring these medication changes for the near future as this is a change from what you have been doing for a while now.  Please take your blood pressures at home and record them for future reference. if you notice worsening episodes of lightheadedness when standing or fainting spells please notify your primary care physician immediately.  Please also monitor for any vision changes especially dark spots in your vision or headaches and notify your primary care physician of those as well.  We are returning you to your normal insulin regimen that you have been doing at home before.  We would like you to carefully monitor your blood sugars and record them daily until the next time you see your primary care physician so that you can talk about any changes that need to be made at that time.  We want to make sure your blood sugars are under control.  Please make an appointment with your primary care physician as soon as possible once you leave the hospital for follow-up on all of these important issues.

## 2021-11-17 NOTE — Progress Notes (Signed)
Physical Therapy Treatment Patient Details Name: David Steele MRN: [id number] DOB: May 14, 1926 Today's Date: 11/17/2021   History of Present Illness 88 y.o. male who presented to [facility] on 11/10/21 with rectal bleeding and weakness.  PMH: covid, hypertension, dyslipidemia, type 2 diabetes mellitus, hypoacusia with cochlear implant, prostate cancer, CKD 3, paroxysmal afib, renal cell carcinoma with R nephrectomy 3/23, diverticulitis.    PT Comments    Pt received supine and agreeable to session with steady progress. Pt able to come to sitting EOB without physical assist with HOB flat. Pt able to come to standing with RW with min assist from low EOB with pt using momentum to power up, and min guard with good hand placement from slightly elevated EOB. Pt able to demonstrate ambulation in room with RW and min A without complaints of dizziness this session and BP stable throughout, however pt fatigues quickly requiring seated rest x1 on EOB between ambulation bouts. Pt with good tolerance for LE therex and agreeable to time up in chair at end of session. Educated pt on importance of continued mobility with pt verbalizing understanding. Pt continues to benefit from skilled PT services to progress toward functional mobility goals.   BP supine: 118/40 BP sitting: 112/51 BP standing after 3 mins: 137/51   Recommendations for follow up therapy are one component of a multi-disciplinary discharge planning process, led by the attending physician.  Recommendations may be updated based on patient status, additional functional criteria and insurance authorization.  Follow Up Recommendations  Home health PT     Assistance Recommended at Discharge Frequent or constant Supervision/Assistance  Patient can return home with the following A little help with walking and/or transfers;Assist for transportation;Assistance with cooking/housework   Equipment Recommendations  None recommended by PT    Recommendations  for Other Services       Precautions / Restrictions Precautions Precautions: Fall Precaution Comments: pt has suffered from dizziness last several months Restrictions Weight Bearing Restrictions: No     Mobility  Bed Mobility Overal bed mobility: Needs Assistance Bed Mobility: Supine to Sit     Supine to sit: Supervision     General bed mobility comments: able to come to EOB without asisst from [facility] flat    Transfers Overall transfer level: Needs assistance Equipment used: Rolling walker (2 wheels) Transfers: Sit to/from Stand Sit to Stand: Min assist, From elevated surface           General transfer comment: min A to steady, did better with elevated bed.no complaints of being dizzy this session    Ambulation/Gait Ambulation/Gait assistance: Min assist Gait Distance (Feet): 20 Feet Assistive device: Rolling walker (2 wheels) Gait Pattern/deviations: Step-through pattern Gait velocity: decreased     General Gait Details: ambultaion in room, pt qucik to fatigue with complaints that his legs feel weak, x1 seated rest on EOB, no complaints of dizziness   Stairs             Wheelchair Mobility    Modified Rankin (Stroke Patients Only)       Balance Overall balance assessment: Needs assistance Sitting-balance support: No upper extremity supported, Feet unsupported Sitting balance-Leahy Scale: Fair     Standing balance support: During functional activity, Reliant on assistive device for balance Standing balance-Leahy Scale: Poor Standing balance comment: Pt needs UE support with mobility and transfers.                            Cognition Arousal/Alertness:  Awake/alert Behavior During Therapy: WFL for tasks assessed/performed Overall Cognitive Status: Within Functional Limits for tasks assessed                                 General Comments: appropriate and can give a good history if he can understand what you're  asking, [facility]        Exercises General Exercises - Lower Extremity Long Arc Quad: AROM, Right, Left, 10 reps, Seated Hip ABduction/ADduction: AROM, Right, Left, 10 reps, Seated Hip Flexion/Marching: AROM, Right, Left, 20 reps, Standing    General Comments General comments (skin integrity, edema, etc.): BP supine on arrival 118/40, sitting EOB 112/51, standing after 3 mins 137/51, pt without complaints of dizziness throughout      Pertinent Vitals/Pain Pain Assessment Pain Assessment: No/denies pain Pain Intervention(s): Monitored during session    Home Living                          Prior Function            PT Goals (current goals can now be found in the care plan section) Acute Rehab PT Goals Patient Stated Goal: stop getting dizzy PT Goal Formulation: With patient Time For Goal Achievement: 11/30/21    Frequency    Min 3X/week      PT Plan      Co-evaluation              AM-PAC PT "6 Clicks" Mobility   Outcome Measure  Help needed turning from your back to your side while in a flat bed without using bedrails?: None Help needed moving from lying on your back to sitting on the side of a flat bed without using bedrails?: None Help needed moving to and from a bed to a chair (including a wheelchair)?: A Little Help needed standing up from a chair using your arms (e.g., wheelchair or bedside chair)?: A Little Help needed to walk in hospital room?: A Lot Help needed climbing 3-5 steps with a railing? : Total 6 Click Score: 17    End of Session   Activity Tolerance: Patient tolerated treatment well Patient left: in chair;with call bell/phone within reach;with chair alarm set Nurse Communication: Mobility status PT Visit Diagnosis: Unsteadiness on feet (R26.81);Dizziness and giddiness (R42)     Time: 1610-9604 PT Time Calculation (min) (ACUTE ONLY): 23 min  Charges:  $Gait Training: 8-22 mins $Therapeutic Exercise: 8-22 mins                      David Steele R. PTA Acute Rehabilitation Services Office: [place] 11/17/2021, 10:36 AM

## 2021-11-17 NOTE — Progress Notes (Signed)
Patient given discharge instructions and stated understanding. 

## 2021-11-17 NOTE — Therapy (Signed)
Occupational Therapy Treatment Patient Details Name: David Steele MRN: [id number] DOB: 1926-03-23 Today's Date: 11/17/2021   History of present illness 88 y.o. male who presented to [facility] on 11/10/21 with rectal bleeding and weakness.  PMH: covid, hypertension, dyslipidemia, type 2 diabetes mellitus, hypoacusia with cochlear implant, prostate cancer, CKD 3, paroxysmal afib, renal cell carcinoma with R nephrectomy 3/23, diverticulitis.   OT comments  Pt received seated in bedside chair and agreeable to treatment. Making progress towards acute OT goals and reports "feeling better every day." Pt ambulating to sink with min A for balance. Completed grooming task with sup. Discussed benefits of using pill box to aid with medication management. He reports his daughter provides supervision for this task. Pt returned to chair at end of session and left with discharge RN. Will benefit from follow-up HHOT to continue to address safety and indep with ADLs once d/c home.    Recommendations for follow up therapy are one component of a multi-disciplinary discharge planning process, led by the attending physician.  Recommendations may be updated based on patient status, additional functional criteria and insurance authorization.    Follow Up Recommendations  Home health OT    Assistance Recommended at Discharge Frequent or constant Supervision/Assistance  Patient can return home with the following  A little help with walking and/or transfers;A little help with bathing/dressing/bathroom;Assistance with cooking/housework;Assist for transportation;Help with stairs or ramp for entrance   Equipment Recommendations  None recommended by OT    Recommendations for Other Services      Precautions / Restrictions Precautions Precautions: Fall Precaution Comments: dizziness Restrictions Weight Bearing Restrictions: No       Mobility     Transfers Overall transfer level: Needs assistance Equipment used: 1  person hand held assist Transfers: Sit to/from Stand, Bed to chair/wheelchair/BSC (ambulating to and from sink) Sit to Stand: Min assist, Min guard (increased time and heavy use of arm rest to acheive standsing posture)                 Balance Overall balance assessment: Needs assistance           Standing balance-Leahy Scale: Poor           ADL either performed or assessed with clinical judgement   ADL Overall ADL's : Needs assistance/impaired Eating/Feeding: Independent   Grooming: Supervision/safety;Min guard;Oral care;Wash/dry face;Wash/dry hands;Standing             Functional mobility during ADLs: Minimal assistance (pt preferred hand held assist, he says he uses a rollator at home)        Cognition Arousal/Alertness: Awake/alert Behavior During Therapy: WFL for tasks assessed/performed Overall Cognitive Status: Within Functional Limits for tasks assessed                                 General Comments: very HOH              General Comments BP supine on arrival 118/40, sitting EOB 112/51, standing after 3 mins 137/51, pt without complaints of dizziness throughout    Pertinent Vitals/ Pain       Pain Assessment Pain Assessment: No/denies pain         Frequency  Min 2X/week        Progress Toward Goals    Progress towards OT goals: Progressing toward goals     Plan Discharge plan remains appropriate       AM-PAC OT "6 Clicks"  Daily Activity     Outcome Measure   Help from another person eating meals?: None Help from another person taking care of personal grooming?: A Little Help from another person toileting, which includes using toliet, bedpan, or urinal?: A Little Help from another person bathing (including washing, rinsing, drying)?: A Little Help from another person to put on and taking off regular upper body clothing?: None Help from another person to put on and taking off regular lower body clothing?: A  Little 6 Click Score: 20    End of Session Equipment Utilized During Treatment: Other (comment) (hand held assist, deferred use of walker as he states he prefers rollator, also reports "furniture" walking at home)  OT Visit Diagnosis: Unsteadiness on feet (R26.81);Other abnormalities of gait and mobility (R26.89);Muscle weakness (generalized) (M62.81)   Activity Tolerance Patient tolerated treatment well   Patient Left in chair;with call bell/phone within reach;with chair alarm set   Nurse Communication Mobility status        Time: 4536-4680 OT Time Calculation (min): 12 min  Charges: OT General Charges $OT Visit: 1 Visit OT Treatments $Self Care/Home Management : 8-22 mins   Naomie Dean Latorie Montesano, OTR/L 11/17/2021, 1:02 PM

## 2021-11-17 NOTE — Discharge Summary (Signed)
Name: David Steele MRN: [id number] DOB: 06/15/1925 88 y.o. PCP: Ernestene Kiel, MD  Date of Admission: 11/10/2021  8:03 AM Date of Discharge:   11/17/2021 Attending Physician: Dr. Daryll Drown  Discharge Diagnosis: Principal Problem:   Diverticulosis of colon with hemorrhage Active Problems:   T2DM (type 2 diabetes mellitus) ([place])   CKD (chronic kidney disease), stage III (HCC)   Paroxysmal atrial fibrillation (HCC)   Anemia   Acute renal failure superimposed on chronic kidney disease (HCC)   Ischemic hepatitis   Shock liver   Elevated liver function tests    Discharge Medications: Allergies as of 11/17/2021       Reactions   Simvastatin Other (See Comments)   Leg pain        Medication List     STOP taking these medications    apixaban 5 MG Tabs tablet Commonly known as: ELIQUIS   finasteride 5 MG tablet Commonly known as: PROSCAR   lisinopril 20 MG tablet Commonly known as: ZESTRIL       TAKE these medications    acetaminophen 650 MG CR tablet Commonly known as: TYLENOL Take 650 mg by mouth daily.   amLODipine 2.5 MG tablet Commonly known as: NORVASC Take 1 tablet (2.5 mg total) by mouth at bedtime.   busPIRone 15 MG tablet Commonly known as: BUSPAR Take 15 mg by mouth 2 (two) times daily. What changed: Another medication with the same name was removed. Continue taking this medication, and follow the directions you see here.   carvedilol 6.25 MG tablet Commonly known as: COREG Take 1 tablet (6.25 mg total) by mouth 2 (two) times daily with a meal.   CENTRUM SILVER ADULT 50+ PO Take 1 tablet by mouth daily. Unknown strength   dorzolamide-timolol 22.3-6.8 MG/ML ophthalmic solution Commonly known as: COSOPT Place 1 drop into the right eye 2 (two) times daily.   ferrous sulfate 325 (65 FE) MG tablet Take 325 mg by mouth daily with breakfast.   insulin NPH-regular Human (70-30) 100 UNIT/ML injection Inject 25 Units into the skin See admin  instructions. 30 units before meal in AM and 25 units in evening before meal   lansoprazole 30 MG capsule Commonly known as: PREVACID Take 30 mg by mouth daily.   metFORMIN 1000 MG tablet Commonly known as: GLUCOPHAGE Take 1,000 mg by mouth 2 (two) times daily with a meal.   mirabegron ER 50 MG Tb24 tablet Commonly known as: MYRBETRIQ Take 50 mg by mouth at bedtime.   ondansetron 4 MG tablet Commonly known as: ZOFRAN Take 4 mg by mouth 2 (two) times daily as needed for vomiting or nausea.   PARoxetine 30 MG tablet Commonly known as: PAXIL Take 30 mg by mouth daily.   Simbrinza 1-0.2 % Susp Generic drug: Brinzolamide-Brimonidine Apply 1 drop to eye in the morning, at noon, and at bedtime.   tamsulosin 0.4 MG Caps capsule Commonly known as: FLOMAX Take 1 capsule (0.4 mg total) by mouth daily. Start taking on: November 18, 2021   traZODone 50 MG tablet Commonly known as: DESYREL Take 50 mg by mouth at bedtime as needed for sleep.        Disposition and follow-up:   David Steele was discharged from [facility] in Good condition.  At the hospital follow up visit please address:  1.  Follow-up:  a.  Assess patient for any further signs of rectal bleeding   b.  Follow-up on blood pressure regimen  c.  Assess for  further signs of urinary retention D.  Confirm resolution of shock liver   2.  Labs / imaging needed at time of follow-up: CBC, CMP  Follow-up Appointments: Please make a follow-up appointment with your PCP  Follow-up [place] [facility] Follow up.   Why: The home health agency will contact you for the first home visit Contact information: [place] by problem list: David Steele is a 88 y.o. male with a past medical history of CKD3, atherosclerosis, renal cell carcinoma s/p R nephrectomy, IDDM, and A-fib on Eliquis anticoagulation at home admitted for 3-4 days of lower GI  bleed likely secondary to diverticulosis.  Stable and ready for discharge after cessation of rectal bleeding.  Lower GI bleed in setting of diverticulosis Symptomatic anemia 2/2 lower GI bleed Patient initially presented with hemoglobin of 5.7 and creatinine of 2.4 in [place] ED. He was given 1 unit PRBC and Feiba reversal agent for Eliquis before transfer to [facility] At [facility] ED, hemoglobin trended down to 6.5 and creatinine rose to 3.5. Potassium was 6.5. Patient was started on pantoprazole, sodium bicarb, Lokelma, and maintenance fluids. On admission, he was given 2 more units of PRBCs. Gastroenterology and nephrology were consulted for management of the patient's complex issues. CT scan with contrast was the preferred imaging modality, but was contraindicated in this patient due to CKD. IR was consulted for possible tagged RBC scan, but scan was deferred because the patient was too agitated to reasonably sit still for the 3.5 hours necessary for the scan.  After discussion with the family and the patient's son (who is the HPOA), the patient's code status was changed to DNR.  On 6/30, IR attempted a limited angiogram (with 40 CCs contrast) of the IMA, SMA, and ileocolic artery, but unfortunately found no targets for embolization. On 7/2, s/p 7 units PRBC, the patient became borderline thrombocytopenic after a gradual downtrend in his platelets. FFP was given to replete coagulation factors given the absence of coagulation factors in PRBCs.  The patient's bleeding slowed dramatically by 7/2 and ceased on 7/3. His mental status, physical strength, and labs continued to improve rapidly during this time. By discharge, the patient had received a total of 7 units PRBC and 1 unit FFP. Hemoglobin rose to 8.3 from a nadir of 6.7 and hematocrit rose to 25.4 from a nadir of 19.6 during this hospitalization. At discharge, he was evaluated by PT and OT and recommended home health with family support and close  follow-up.   AKI on CKD 3 Non anion gap metabolic acidosis Hyperkalemia Patient has underlying kidney disease and presented with AKI in the setting of hypovolemia 2/2 GI bleed. On admission, creatinine was trending up and peaked at 3.50 on 6/28. Foley catheter was placed on 6/29 and urine output increased markedly and creatinine began trending down.  On discharge creatinine was 1.68, compared to an estimated baseline of 1.4-1.6 (unclear due to lack of outpatient labs status post right nephrectomy). While AKI etiology was initially believed to be ATN due to hypovolemia, the rapid downtrend in creatinine of approximately 0.5 within 12 hours of Foley placement indicated outlet obstruction as a contributing factor.  Flomax was started on 7/2 and Foley was removed on 7/3 part of a voiding trial.  The patient was also hyperkalemic on admission to 6.5, but after administration of Lokelma and calcium gluconate, the hyperkalemia resolved  by 6/29.  Patient was acidotic with a bicarb of 11 on admission likely due to hypovolemia and poor perfusion. His acidosis resolved following daily treatment with fluids and bicarb administration by 7/2.   Ischemic hepatitis On 6/29, the patient's labs showed an initial sharp increase in LFTs consistent with endorgan damage, likely due to ischemic hepatitis in the setting of hypovolemia and anemia limiting perfusion of the liver. Over the next few days, AST and ALT began gradually trending down. Treatment included rehydration and transfusion of PRBCs as management of the underlying causes of the hepatitis such as anemia and hypovolemia.  At discharge the patient's AST and ALT values had returned to 32 and 180, respectively.  Follow-up needed in the outpatient setting to confirm resolution.  HTN The patient took lisinopril at home, but it was held during the patient's admission due to its potential renal toxicity and concerns surrounding hypotension give the patient's active  bleeding. Following resolution of bleeding by 7/3, the patient's hypertension was treated with carvedilol 6.25 mg BID. He continued to be hypertensive and carvedilol was increased to 12.5 mg BID, but he became bradycardic and he was kept on 6.25 mg carvedilol BID and started on amlodipine 2.5 mg daily instead.  Further outpatient management recommended.  Paroxysmal A-fib EKG on admission did not show active A-fib. Held home Eliquis and reversed it with Feiba on admission given GI bleed. Placed patient on continuous cardiac monitoring. Based on a CHA2DS2-VASc Score of 5, the patient has a 7.2% stroke risk per year.  Did not restart Eliquis upon discharge per discussion with family because bleeding risk was deemed too high considering the severity of the patient's current admission. Patient was started on 6.25 mg carvedilol for HTN control on 7/2 with the added benefit of rate control for A-fib.  Insulin-dependent diabetes mellitus Patient presented with blood glucose elevated to 300, with goal being <180.  The patient normally takes 25 units regular insulin BID at home, and he was started on CBG monitoring Q4 with 5 units of long-acting insulin as well as sliding scale short acting insulin with meals.  The patient's blood glucose remained elevated, but began trending down on 6/30, when patient's kidney function began to improve.  On 7/1 patient's long-acting insulin was increased to 10 units for better coverage.  Blood glucose continued to trend down throughout the rest of admission, but was frequently above 200 at discharge.  Patient was discharged on home regimen of insulin and metformin 1000 mg twice daily was restarted with close follow-up with PCP and asked to keep a log of home blood glucose measurements for further adjustment of regimen.  Depression The patient's mental status improved starting on 7/2. On 7/4, he reported feeling low mood and depressed. At that time, it was learned that at home, he  takes buspirone 15 mg BID, paroxetine 30 mg daily, and trazodone 50 mg at bedtime. His buspirone and trazodone were restarted on 7/4. He did not report any depressive symptoms on the day of discharge.  Subjective: This morning, the patient reports that he is in a good mood. He denies chest pain, SOB, abdominal pain, and nausea/vomiting. He is excited to go home and he has a good appetite. There were no significant overnight events and he is stable without any rectal bleeding.  Discharge Vitals:   BP (!) 140/53 (BP Location: Left Arm)   Pulse 61   Temp 98.5 F (36.9 C) (Oral)   Resp 18   Ht '5\' 11"'$  (1.803  m)   Wt 81.2 kg   SpO2 90%   BMI 24.97 kg/m   Discharge exam: General:  Elderly patient, comfortably eating in bed. HEENT: Hard of hearing. Cardiovascular:  RRR, no murmurs/rubs/gallops. Normal S1/S2. Respiratory:  Normal WOB on room air. CTAB, no rales or crackles. Abdominal:  Normoactive bowel sounds. Not tender to palpation, no rebound or guarding. Extremities: No cyanosis or edema. Skin:  Warm, pink. No rashes. Psych:  Pleasant and appropriate.  Pertinent Labs, Studies, and Procedures:     Latest Ref Rng & Units 11/17/2021    2:37 AM 11/16/2021    2:19 AM 11/15/2021    7:14 AM  CBC  WBC 4.0 - 10.5 K/uL 9.9  9.4  8.1   Hemoglobin 13.0 - 17.0 g/dL 8.3  8.4  8.6   Hematocrit 39.0 - 52.0 % 25.4  25.1  26.4   Platelets 150 - 400 K/uL 186  170  160        Latest Ref Rng & Units 11/17/2021    2:37 AM 11/16/2021    2:19 AM 11/15/2021    7:14 AM  CMP  Glucose 70 - 99 mg/dL 202  214  154   BUN 8 - 23 mg/dL '19  19  16   '$ Creatinine 0.61 - 1.24 mg/dL 1.68  1.62  1.54   Sodium 135 - 145 mmol/L 139  137  137   Potassium 3.5 - 5.1 mmol/L 4.5  4.2  3.1   Chloride 98 - 111 mmol/L 108  107  109   CO2 22 - 32 mmol/L '27  24  23   '$ Calcium 8.9 - 10.3 mg/dL 8.1  7.8  7.7   Total Protein 6.5 - 8.1 g/dL 4.4  4.2  4.4   Total Bilirubin 0.3 - 1.2 mg/dL 0.6  0.8  0.8   Alkaline Phos 38 - 126 U/L 82   87  97   AST 15 - 41 U/L 32  50  81   ALT 0 - 44 U/L 180  238  324      IR Angiogram Visceral Selective  Result Date: 11/12/2021 INDICATION: 88 year old male with lower GI bleeding, acute on chronic renal failure. EXAM: ULTRASOUND-GUIDED ACCESS RIGHT COMMON FEMORAL ARTERY MESENTERIC ANGIOGRAM CELT DEVICE FOR CLOSURE MEDICATIONS: None ANESTHESIA/SEDATION: Moderate (conscious) sedation was employed during this procedure. A total of Versed 1.0 mg and Fentanyl 50 mcg was administered intravenously by the radiology nurse. Total intra-service moderate Sedation Time: 34 minutes. The patient's level of consciousness and vital signs were monitored continuously by radiology nursing throughout the procedure under my direct supervision. CONTRAST:  40 cc FLUOROSCOPY: Radiation Exposure Index (as provided by the fluoroscopic device): 270 mGy Kerma COMPLICATIONS: None PROCEDURE: Informed consent was obtained from the patient following explanation of the procedure, risks, benefits and alternatives. The patient understands, agrees and consents for the procedure. All questions were addressed. A time out was performed prior to the initiation of the procedure. Maximal barrier sterile technique utilized including caps, mask, sterile gowns, sterile gloves, large sterile drape, hand hygiene, and Betadine prep. Ultrasound survey of the right inguinal region was performed with images stored and sent to PACs, confirming patency of the vessel. A micropuncture needle was used access the right common femoral artery under ultrasound. With excellent arterial blood flow returned, and an .018 micro wire was passed through the needle, observed enter the abdominal aorta under fluoroscopy. The needle was removed, and a micropuncture sheath was placed over the wire. The inner dilator  and wire were removed, and an 035 Bentson wire was advanced under fluoroscopy into the abdominal aorta. The sheath was removed and a standard 5 [place] vascular  sheath was placed. The dilator was removed and the sheath was flushed. Mickelson catheter was advanced on the Bentson wire and formed within the lower thoracic aorta. Mickelson catheter was then used to engage the IMA. Angiogram was performed. Penumbra lantern microcatheter was then advanced on 014 fathom wire into the superior rectal arteries. Angiogram was performed. Catheter was withdrawn, and then advanced into the left colic artery. Angiogram was performed. Catheter was then withdrawn and advanced into the mesenteric artery supplying grip fifths point along the marginal artery of [place]. Angiogram was performed. Microcatheter and microwire were then withdrawn and the Mickelson catheter was used to select the superior mesenteric artery. Angiogram was performed. Catheter was withdrawn slightly such that the tip was engaged into replaced right hepatic artery. Limited angiogram was performed confirming replaced right hepatic artery. All catheters and wires were then removed. A Celt device was deployed for hemostasis. Patient tolerated procedure well and remained hemodynamically stable throughout. No complications and no significant blood loss. FINDINGS: Angiogram of the inferior mesenteric artery and superior mesenteric artery, with interest to the length of the colon as the suspected site of lower GI bleeding demonstrates no target for embolization. Replaced right hepatic artery IMPRESSION: Status post ultrasound guided access right common femoral artery, with mesenteric angiogram performed of the length of the colon identifying no target for possible embolization. Celt deployed for hemostasis. Signed, Dulcy Fanny. Nadene Rubins, RPVI [facility] Radiology Electronically Signed   By: Corrie Mckusick D.O.   On: 11/12/2021 09:51   IR US Guide Vasc Access Right  Result Date: 11/12/2021 INDICATION: 88 year old male with lower GI bleeding, acute on chronic renal  failure. EXAM: ULTRASOUND-GUIDED ACCESS RIGHT COMMON FEMORAL ARTERY MESENTERIC ANGIOGRAM CELT DEVICE FOR CLOSURE MEDICATIONS: None ANESTHESIA/SEDATION: Moderate (conscious) sedation was employed during this procedure. A total of Versed 1.0 mg and Fentanyl 50 mcg was administered intravenously by the radiology nurse. Total intra-service moderate Sedation Time: 34 minutes. The patient's level of consciousness and vital signs were monitored continuously by radiology nursing throughout the procedure under my direct supervision. CONTRAST:  40 cc FLUOROSCOPY: Radiation Exposure Index (as provided by the fluoroscopic device): 413 mGy Kerma COMPLICATIONS: None PROCEDURE: Informed consent was obtained from the patient following explanation of the procedure, risks, benefits and alternatives. The patient understands, agrees and consents for the procedure. All questions were addressed. A time out was performed prior to the initiation of the procedure. Maximal barrier sterile technique utilized including caps, mask, sterile gowns, sterile gloves, large sterile drape, hand hygiene, and Betadine prep. Ultrasound survey of the right inguinal region was performed with images stored and sent to PACs, confirming patency of the vessel. A micropuncture needle was used access the right common femoral artery under ultrasound. With excellent arterial blood flow returned, and an .018 micro wire was passed through the needle, observed enter the abdominal aorta under fluoroscopy. The needle was removed, and a micropuncture sheath was placed over the wire. The inner dilator and wire were removed, and an 035 Bentson wire was advanced under fluoroscopy into the abdominal aorta. The sheath was removed and a standard 5 [place] vascular sheath was placed. The dilator was removed and the sheath was flushed. Mickelson catheter was advanced on the Bentson wire and formed within the lower thoracic aorta. Mickelson catheter was then used to engage  the  IMA. Angiogram was performed. Penumbra lantern microcatheter was then advanced on 014 fathom wire into the superior rectal arteries. Angiogram was performed. Catheter was withdrawn, and then advanced into the left colic artery. Angiogram was performed. Catheter was then withdrawn and advanced into the mesenteric artery supplying grip fifths point along the marginal artery of [place]. Angiogram was performed. Microcatheter and microwire were then withdrawn and the Mickelson catheter was used to select the superior mesenteric artery. Angiogram was performed. Catheter was withdrawn slightly such that the tip was engaged into replaced right hepatic artery. Limited angiogram was performed confirming replaced right hepatic artery. All catheters and wires were then removed. A Celt device was deployed for hemostasis. Patient tolerated procedure well and remained hemodynamically stable throughout. No complications and no significant blood loss. FINDINGS: Angiogram of the inferior mesenteric artery and superior mesenteric artery, with interest to the length of the colon as the suspected site of lower GI bleeding demonstrates no target for embolization. Replaced right hepatic artery IMPRESSION: Status post ultrasound guided access right common femoral artery, with mesenteric angiogram performed of the length of the colon identifying no target for possible embolization. Celt deployed for hemostasis. Signed, Dulcy Fanny. Nadene Rubins, RPVI [facility] Radiology Electronically Signed   By: Corrie Mckusick D.O.   On: 11/12/2021 09:51   IR Angiogram Selective Each Additional Vessel  Result Date: 11/12/2021 INDICATION: 88 year old male with lower GI bleeding, acute on chronic renal failure. EXAM: ULTRASOUND-GUIDED ACCESS RIGHT COMMON FEMORAL ARTERY MESENTERIC ANGIOGRAM CELT DEVICE FOR CLOSURE MEDICATIONS: None ANESTHESIA/SEDATION: Moderate (conscious) sedation was employed during  this procedure. A total of Versed 1.0 mg and Fentanyl 50 mcg was administered intravenously by the radiology nurse. Total intra-service moderate Sedation Time: 34 minutes. The patient's level of consciousness and vital signs were monitored continuously by radiology nursing throughout the procedure under my direct supervision. CONTRAST:  40 cc FLUOROSCOPY: Radiation Exposure Index (as provided by the fluoroscopic device): 948 mGy Kerma COMPLICATIONS: None PROCEDURE: Informed consent was obtained from the patient following explanation of the procedure, risks, benefits and alternatives. The patient understands, agrees and consents for the procedure. All questions were addressed. A time out was performed prior to the initiation of the procedure. Maximal barrier sterile technique utilized including caps, mask, sterile gowns, sterile gloves, large sterile drape, hand hygiene, and Betadine prep. Ultrasound survey of the right inguinal region was performed with images stored and sent to PACs, confirming patency of the vessel. A micropuncture needle was used access the right common femoral artery under ultrasound. With excellent arterial blood flow returned, and an .018 micro wire was passed through the needle, observed enter the abdominal aorta under fluoroscopy. The needle was removed, and a micropuncture sheath was placed over the wire. The inner dilator and wire were removed, and an 035 Bentson wire was advanced under fluoroscopy into the abdominal aorta. The sheath was removed and a standard 5 [place] vascular sheath was placed. The dilator was removed and the sheath was flushed. Mickelson catheter was advanced on the Bentson wire and formed within the lower thoracic aorta. Mickelson catheter was then used to engage the IMA. Angiogram was performed. Penumbra lantern microcatheter was then advanced on 014 fathom wire into the superior rectal arteries. Angiogram was performed. Catheter was withdrawn, and then advanced into  the left colic artery. Angiogram was performed. Catheter was then withdrawn and advanced into the mesenteric artery supplying grip fifths point along the marginal artery of [place]. Angiogram was performed.  Microcatheter and microwire were then withdrawn and the Mickelson catheter was used to select the superior mesenteric artery. Angiogram was performed. Catheter was withdrawn slightly such that the tip was engaged into replaced right hepatic artery. Limited angiogram was performed confirming replaced right hepatic artery. All catheters and wires were then removed. A Celt device was deployed for hemostasis. Patient tolerated procedure well and remained hemodynamically stable throughout. No complications and no significant blood loss. FINDINGS: Angiogram of the inferior mesenteric artery and superior mesenteric artery, with interest to the length of the colon as the suspected site of lower GI bleeding demonstrates no target for embolization. Replaced right hepatic artery IMPRESSION: Status post ultrasound guided access right common femoral artery, with mesenteric angiogram performed of the length of the colon identifying no target for possible embolization. Celt deployed for hemostasis. Signed, Dulcy Fanny. Nadene Rubins, RPVI [facility] Radiology Electronically Signed   By: Corrie Mckusick D.O.   On: 11/12/2021 09:51   IR Angiogram Selective Each Additional Vessel  Result Date: 11/12/2021 INDICATION: 88 year old male with lower GI bleeding, acute on chronic renal failure. EXAM: ULTRASOUND-GUIDED ACCESS RIGHT COMMON FEMORAL ARTERY MESENTERIC ANGIOGRAM CELT DEVICE FOR CLOSURE MEDICATIONS: None ANESTHESIA/SEDATION: Moderate (conscious) sedation was employed during this procedure. A total of Versed 1.0 mg and Fentanyl 50 mcg was administered intravenously by the radiology nurse. Total intra-service moderate Sedation Time: 34 minutes. The patient's level of  consciousness and vital signs were monitored continuously by radiology nursing throughout the procedure under my direct supervision. CONTRAST:  40 cc FLUOROSCOPY: Radiation Exposure Index (as provided by the fluoroscopic device): 573 mGy Kerma COMPLICATIONS: None PROCEDURE: Informed consent was obtained from the patient following explanation of the procedure, risks, benefits and alternatives. The patient understands, agrees and consents for the procedure. All questions were addressed. A time out was performed prior to the initiation of the procedure. Maximal barrier sterile technique utilized including caps, mask, sterile gowns, sterile gloves, large sterile drape, hand hygiene, and Betadine prep. Ultrasound survey of the right inguinal region was performed with images stored and sent to PACs, confirming patency of the vessel. A micropuncture needle was used access the right common femoral artery under ultrasound. With excellent arterial blood flow returned, and an .018 micro wire was passed through the needle, observed enter the abdominal aorta under fluoroscopy. The needle was removed, and a micropuncture sheath was placed over the wire. The inner dilator and wire were removed, and an 035 Bentson wire was advanced under fluoroscopy into the abdominal aorta. The sheath was removed and a standard 5 [place] vascular sheath was placed. The dilator was removed and the sheath was flushed. Mickelson catheter was advanced on the Bentson wire and formed within the lower thoracic aorta. Mickelson catheter was then used to engage the IMA. Angiogram was performed. Penumbra lantern microcatheter was then advanced on 014 fathom wire into the superior rectal arteries. Angiogram was performed. Catheter was withdrawn, and then advanced into the left colic artery. Angiogram was performed. Catheter was then withdrawn and advanced into the mesenteric artery supplying grip fifths point along the marginal artery of [place]. Angiogram  was performed. Microcatheter and microwire were then withdrawn and the Mickelson catheter was used to select the superior mesenteric artery. Angiogram was performed. Catheter was withdrawn slightly such that the tip was engaged into replaced right hepatic artery. Limited angiogram was performed confirming replaced right hepatic artery. All catheters and wires were then removed. A Celt device was deployed for hemostasis. Patient tolerated  procedure well and remained hemodynamically stable throughout. No complications and no significant blood loss. FINDINGS: Angiogram of the inferior mesenteric artery and superior mesenteric artery, with interest to the length of the colon as the suspected site of lower GI bleeding demonstrates no target for embolization. Replaced right hepatic artery IMPRESSION: Status post ultrasound guided access right common femoral artery, with mesenteric angiogram performed of the length of the colon identifying no target for possible embolization. Celt deployed for hemostasis. Signed, Dulcy Fanny. Nadene Rubins, RPVI [facility] Radiology Electronically Signed   By: Corrie Mckusick D.O.   On: 11/12/2021 09:51   IR Angiogram Visceral Selective  Result Date: 11/12/2021 INDICATION: 88 year old male with lower GI bleeding, acute on chronic renal failure. EXAM: ULTRASOUND-GUIDED ACCESS RIGHT COMMON FEMORAL ARTERY MESENTERIC ANGIOGRAM CELT DEVICE FOR CLOSURE MEDICATIONS: None ANESTHESIA/SEDATION: Moderate (conscious) sedation was employed during this procedure. A total of Versed 1.0 mg and Fentanyl 50 mcg was administered intravenously by the radiology nurse. Total intra-service moderate Sedation Time: 34 minutes. The patient's level of consciousness and vital signs were monitored continuously by radiology nursing throughout the procedure under my direct supervision. CONTRAST:  40 cc FLUOROSCOPY: Radiation Exposure Index (as provided by the  fluoroscopic device): 875 mGy Kerma COMPLICATIONS: None PROCEDURE: Informed consent was obtained from the patient following explanation of the procedure, risks, benefits and alternatives. The patient understands, agrees and consents for the procedure. All questions were addressed. A time out was performed prior to the initiation of the procedure. Maximal barrier sterile technique utilized including caps, mask, sterile gowns, sterile gloves, large sterile drape, hand hygiene, and Betadine prep. Ultrasound survey of the right inguinal region was performed with images stored and sent to PACs, confirming patency of the vessel. A micropuncture needle was used access the right common femoral artery under ultrasound. With excellent arterial blood flow returned, and an .018 micro wire was passed through the needle, observed enter the abdominal aorta under fluoroscopy. The needle was removed, and a micropuncture sheath was placed over the wire. The inner dilator and wire were removed, and an 035 Bentson wire was advanced under fluoroscopy into the abdominal aorta. The sheath was removed and a standard 5 [place] vascular sheath was placed. The dilator was removed and the sheath was flushed. Mickelson catheter was advanced on the Bentson wire and formed within the lower thoracic aorta. Mickelson catheter was then used to engage the IMA. Angiogram was performed. Penumbra lantern microcatheter was then advanced on 014 fathom wire into the superior rectal arteries. Angiogram was performed. Catheter was withdrawn, and then advanced into the left colic artery. Angiogram was performed. Catheter was then withdrawn and advanced into the mesenteric artery supplying grip fifths point along the marginal artery of [place]. Angiogram was performed. Microcatheter and microwire were then withdrawn and the Mickelson catheter was used to select the superior mesenteric artery. Angiogram was performed. Catheter was withdrawn slightly such that  the tip was engaged into replaced right hepatic artery. Limited angiogram was performed confirming replaced right hepatic artery. All catheters and wires were then removed. A Celt device was deployed for hemostasis. Patient tolerated procedure well and remained hemodynamically stable throughout. No complications and no significant blood loss. FINDINGS: Angiogram of the inferior mesenteric artery and superior mesenteric artery, with interest to the length of the colon as the suspected site of lower GI bleeding demonstrates no target for embolization. Replaced right hepatic artery IMPRESSION: Status post ultrasound guided access right common femoral artery, with mesenteric angiogram  performed of the length of the colon identifying no target for possible embolization. Celt deployed for hemostasis. Signed, Dulcy Fanny. Nadene Rubins, RPVI [facility] Radiology Electronically Signed   By: Corrie Mckusick D.O.   On: 11/12/2021 09:51   IR Angiogram Selective Each Additional Vessel  Result Date: 11/12/2021 INDICATION: 88 year old male with lower GI bleeding, acute on chronic renal failure. EXAM: ULTRASOUND-GUIDED ACCESS RIGHT COMMON FEMORAL ARTERY MESENTERIC ANGIOGRAM CELT DEVICE FOR CLOSURE MEDICATIONS: None ANESTHESIA/SEDATION: Moderate (conscious) sedation was employed during this procedure. A total of Versed 1.0 mg and Fentanyl 50 mcg was administered intravenously by the radiology nurse. Total intra-service moderate Sedation Time: 34 minutes. The patient's level of consciousness and vital signs were monitored continuously by radiology nursing throughout the procedure under my direct supervision. CONTRAST:  40 cc FLUOROSCOPY: Radiation Exposure Index (as provided by the fluoroscopic device): 938 mGy Kerma COMPLICATIONS: None PROCEDURE: Informed consent was obtained from the patient following explanation of the procedure, risks, benefits and alternatives. The patient  understands, agrees and consents for the procedure. All questions were addressed. A time out was performed prior to the initiation of the procedure. Maximal barrier sterile technique utilized including caps, mask, sterile gowns, sterile gloves, large sterile drape, hand hygiene, and Betadine prep. Ultrasound survey of the right inguinal region was performed with images stored and sent to PACs, confirming patency of the vessel. A micropuncture needle was used access the right common femoral artery under ultrasound. With excellent arterial blood flow returned, and an .018 micro wire was passed through the needle, observed enter the abdominal aorta under fluoroscopy. The needle was removed, and a micropuncture sheath was placed over the wire. The inner dilator and wire were removed, and an 035 Bentson wire was advanced under fluoroscopy into the abdominal aorta. The sheath was removed and a standard 5 [place] vascular sheath was placed. The dilator was removed and the sheath was flushed. Mickelson catheter was advanced on the Bentson wire and formed within the lower thoracic aorta. Mickelson catheter was then used to engage the IMA. Angiogram was performed. Penumbra lantern microcatheter was then advanced on 014 fathom wire into the superior rectal arteries. Angiogram was performed. Catheter was withdrawn, and then advanced into the left colic artery. Angiogram was performed. Catheter was then withdrawn and advanced into the mesenteric artery supplying grip fifths point along the marginal artery of [place]. Angiogram was performed. Microcatheter and microwire were then withdrawn and the Mickelson catheter was used to select the superior mesenteric artery. Angiogram was performed. Catheter was withdrawn slightly such that the tip was engaged into replaced right hepatic artery. Limited angiogram was performed confirming replaced right hepatic artery. All catheters and wires were then removed. A Celt device was deployed  for hemostasis. Patient tolerated procedure well and remained hemodynamically stable throughout. No complications and no significant blood loss. FINDINGS: Angiogram of the inferior mesenteric artery and superior mesenteric artery, with interest to the length of the colon as the suspected site of lower GI bleeding demonstrates no target for embolization. Replaced right hepatic artery IMPRESSION: Status post ultrasound guided access right common femoral artery, with mesenteric angiogram performed of the length of the colon identifying no target for possible embolization. Celt deployed for hemostasis. Signed, Dulcy Fanny. Nadene Rubins, RPVI [facility] Inter[facility] Radiology Specialists [facility] Radiology Electronically Signed   By: Corrie Mckusick D.O.   On: 11/12/2021 09:51   US RENAL  Result Date: 11/10/2021 CLINICAL DATA:  Right nephrectomy, AKI EXAM: RENAL /  URINARY TRACT ULTRASOUND COMPLETE COMPARISON:  CT abdomen/pelvis 05/14/2021 abdomen 06/13/2018 FINDINGS: Right Kidney: Surgically absent. There is no abnormal lesion seen in the nephrectomy bed. Left Kidney: Renal measurements: 12.8 cm x 5.3 cm x 6.0 cm = volume: 213 mL. Multiple cysts are seen, the largest measuring 6.8 cm x 6.2 cm x 7.1 cm. Parenchymal echogenicity is normal. There is no hydronephrosis. Bladder: The bladder is decompressed. The left ureteral jet is seen. The prostate is enlarged. Other: None. IMPRESSION: 1. Status post right nephrectomy. No sonographic abnormality in the nephrectomy bed. 2. Multiple left renal cysts are again seen. 3. Enlarged prostate. Electronically Signed   By: Valetta Mole M.D.   On: 11/10/2021 13:00     Discharge Instructions: Discharge Instructions     Call MD for:  persistant dizziness or light-headedness   Complete by: As directed    If you have a fainting spell or worsening severe weakness please come in to be evaluated in the emergency department   Call MD for:  persistant nausea and vomiting    Complete by: As directed    Call MD for:  severe uncontrolled pain   Complete by: As directed    Contact us or your PCP if you have severe belly pain similar to what you experienced before coming into the hospital   Call MD for:  temperature >100.4   Complete by: As directed    Diet - low sodium heart healthy   Complete by: As directed    Increase activity slowly   Complete by: As directed    No wound care   Complete by: As directed        Signed: Linus Galas, MD 11/17/2021, 12:53 PM   Pager: [phone]

## 2021-11-17 NOTE — Progress Notes (Incomplete)
Subjective: David Steele is a 88 yo male with a PMH of HTN, HLD, T2DM, CKD IIIa, paroxysmal atrial fibrillation on Eliquis, diverticulosis, BPH, and renal cell carcinoma status post right laparoscopic nephrectomy on 07/29/2021 presenting with rectal bleeding and admitted for a lower GI bleed likely secondary to diverticulosis.  Still feeling good, no major changes overnight.   Objective:  Vital signs in last 24 hours: Vitals:   11/16/21 1611 11/16/21 1700 11/16/21 1924 11/17/21 0505  BP: (!) 173/63 (!) 152/55 (!) 113/42 (!) 143/49  Pulse: (!) 59 60 (!) 58 62  Resp:   18 18  Temp:   98.4 F (36.9 C) 98.4 F (36.9 C)  TempSrc:   Oral Oral  SpO2: 100% 96% 99% 95%  Weight:    81.2 kg  Height:       Range: Blood pressures holding steady in 120-150/45-55 range and only borderline bradycardia to 58-65, 95% on room air   Intake/Output Summary (Last 24 hours) at 11/17/2021 0645 Last data filed at 11/17/2021 0400 Gross per 24 hour  Intake 720 ml  Output 1600 ml  Net -880 ml   ***  Physical Exam  General:   Cardiovascular:   Respiratory:   Abdominal:   Extremities:  Skin:   Psych:       Latest Ref Rng & Units 11/17/2021    2:37 AM 11/16/2021    2:19 AM 11/15/2021    7:14 AM  CBC  WBC 4.0 - 10.5 K/uL 9.9  9.4  8.1   Hemoglobin 13.0 - 17.0 g/dL 8.3  8.4  8.6   Hematocrit 39.0 - 52.0 % 25.4  25.1  26.4   Platelets 150 - 400 K/uL 186  170  160     CMP     Component Value Date/Time   NA 139 11/17/2021 0237   K 4.5 11/17/2021 0237   CL 108 11/17/2021 0237   CO2 27 11/17/2021 0237   GLUCOSE 202 (H) 11/17/2021 0237   BUN 19 11/17/2021 0237   CREATININE 1.68 (H) 11/17/2021 0237   CALCIUM 8.1 (L) 11/17/2021 0237   PROT 4.4 (L) 11/17/2021 0237   ALBUMIN 2.3 (L) 11/17/2021 0237   AST 32 11/17/2021 0237   ALT 180 (H) 11/17/2021 0237   ALKPHOS 82 11/17/2021 0237   BILITOT 0.6 11/17/2021 0237   GFRNONAA 38 (L) 11/17/2021 0237   GFRAA >60 07/06/2019 0010    CBG (last 3)   Recent Labs    11/16/21 1608 11/16/21 2107 11/17/21 0625  GLUCAP 226* 244* 166*    Imaging:    Assessment/Plan:  Principal Problem:   Diverticulosis of colon with hemorrhage Active Problems:   T2DM (type 2 diabetes mellitus) (HCC)   CKD (chronic kidney disease), stage III (HCC)   Paroxysmal atrial fibrillation (HCC)   Anemia   Acute renal failure superimposed on chronic kidney disease (HCC)   Ischemic hepatitis   Shock liver   Elevated liver function tests  David Steele is a 88 yo male with a PMH of HTN, HLD, T2DM, CKD IIIa, paroxysmal atrial fibrillation on Eliquis, diverticulosis, BPH, and renal cell carcinoma status post right laparoscopic nephrectomy on 07/29/2021 presenting with rectal bleeding and admitted for a lower GI bleed likely secondary to diverticulosis.  #1 Lower GI bleed, suspect diverticulosis (resolved) Acute on Chronic Symptomatic Anemia  Patient s/p 7 units PRBC and 1 unit FFP. Hgb is 8.4 this morning. Lack of rectal bleeding overnight. It appears that the patient's bleed has stopped and further imaging  is unnecessary at this time for any diagnostic purposes. - Daily CBC and CMP - Continue closely monitoring vitals with MAP goal > 65 - Continue oral pantoprazole 40 mg QID - GI signed off, appreciate their assistance throughout this admission. Final recs provided on 7/2 listed below: - Did not recommend colonoscopy at this time as it would be purely diagnostic with no therapeutic benefit and significant risks - Feel most likely bleed etiology was diverticular bleed that is unlikely to spontaneously recur after resolution   AKI on CKD 3 (improving) Acute Urinary Retention 2/2 BPH Patient has underlying kidney dieasese and presents with AKI in the setting of hypovolemia 2/2 GI bleed. After initiating Flomax on 7/2, the Foley was removed and a voiding trial done 7/3, subsequent Cr 1.62, likely reflects baseline post right nephrectomy.  - Continue Flomax 0.4  mg QID, CTM Cr during voiding trial - Strict I/Os, daily weights - Nephrology signed off 7/1 due to consistently improving kidney function   HTN Blood pressures stable overnight but low this morning in addition to bradycardia after increasing regimen of Coreg.  Patient also reporting dizziness while attempting to work with PT and informed us about likely chronic orthostatic hypotension. - Reduce carvedilol to 6.25 6.25 mg BID - Add amlodipine 2.5 mg daily - 1L LR bolus   Paroxysmal A-fib Held Eliquis throughout admission given GI bleed. Will reassess restarting these medications as patient improves, but must weight anticoagulation benefits for stroke prevention against the bleeding risks in this 88 year old. Given his age, HLD, HTN, A-fib, and bleeding risk as contraindication to restarting Eliquis, the patient has a high risk of stroke. CHA2DS2-VASc Score of 5 indicates a 7.2% risk of stroke in the next year. - Discuss risks/benefits of restarting Eliquis with family prior to discharge - Carvedilol 6.25 mg BID contributing to rate-control   Shock liver 2/2 LGIB After initial sharp increase in LFTs, AST and ALT are continuing to trend down, indicating liver recovery. - Continue monitoring LFTs daily   Insulin-dependent diabetes mellitus Blood glucose has continued gradually trending down and is now mostly at goal of <180, ranging from 214-290 in the last 12 hours. The patient normally takes 25 units regular insulin BID at home, which he should resume upon discharge with the goal of euglycemia at home. - Insulin glargine 15 units daily and insulin aspart 5 units with meals   Bilateral foot pain Sharp pain in bilateral plantar surfaces of feet as well as numbness likely reflects diabetic neuropathy.  Patient was managing with conservative measures at home, I would hesitate to start treatment with neuropathic pain management (gabapentin, pregabalin, Lyrica) in this patient given age, risks, and  comorbidities. - Resume conservative management at home   Diet: Carb-Modified, heart healthy IVF: 1 L LR,Bolus today VTE: SCDs Code: DNR PT/OT recs: Home Health, none. TOC recs: Home health services     Dispo: Anticipated discharge to home pending stabilization of blood pressure, likely tomorrow    LOS: 7 days   Chapman Matteucci, Medical Student 11/17/2021, 6:45 AM  Pager number: [phone]

## 2021-11-17 NOTE — TOC Transition Note (Signed)
Transition of Care [facility]) - CM/SW Discharge Note   Patient Details  Name: David Steele MRN: [id number] Date of Birth: [birth year]  [facility] CM/SW Contact:  Zenon Mayo, RN Phone Number: 11/17/2021, 11:23 AM   Clinical Narrative:    Patient is for dc today, NCM notified Malachy Mood with [facility], patient has transport.       Barriers to Discharge: Continued Medical Work up   Patient Goals and CMS Choice   CMS Medicare.gov Compare Post Acute Care list provided to:: Patient Choice offered to / list presented to : Patient  Discharge Placement                       Discharge Plan and Services   Discharge Planning Services: CM Consult Post Acute Care Choice: Home Health                    HH Arranged: PT, OT [facility] Agency: [place] Date [place]: 11/16/21   Representative spoke with at [place]: Malachy Mood  Social Determinants of Health ([place]) Interventions     Readmission Risk Interventions     No data to display

## 2021-11-17 NOTE — Plan of Care (Signed)
  Problem: Education: Goal: Ability to describe self-care measures that may prevent or decrease complications (Diabetes Survival Skills Education) will improve Outcome: Adequate for Discharge Goal: Individualized Educational Video(s) Outcome: Adequate for Discharge   Problem: Fluid Volume: Goal: Ability to maintain a balanced intake and output will improve Outcome: Adequate for Discharge   Problem: Health Behavior/Discharge Planning: Goal: Ability to identify and utilize available resources and services will improve Outcome: Adequate for Discharge Goal: Ability to manage health-related needs will improve Outcome: Adequate for Discharge   Problem: Metabolic: Goal: Ability to maintain appropriate glucose levels will improve Outcome: Adequate for Discharge   Problem: Skin Integrity: Goal: Risk for impaired skin integrity will decrease Outcome: Adequate for Discharge   Problem: Education: Goal: Knowledge of General Education information will improve Description: Including pain rating scale, medication(s)/side effects and non-pharmacologic comfort measures Outcome: Adequate for Discharge   Problem: Health Behavior/Discharge Planning: Goal: Ability to manage health-related needs will improve Outcome: Adequate for Discharge   Problem: Clinical Measurements: Goal: Ability to maintain clinical measurements within normal limits will improve Outcome: Adequate for Discharge Goal: Diagnostic test results will improve Outcome: Adequate for Discharge   Problem: Coping: Goal: Level of anxiety will decrease Outcome: Adequate for Discharge   Problem: Elimination: Goal: Will not experience complications related to bowel motility Outcome: Adequate for Discharge Goal: Will not experience complications related to urinary retention Outcome: Adequate for Discharge   Problem: Pain Managment: Goal: General experience of comfort will improve Outcome: Adequate for Discharge   Problem: Skin  Integrity: Goal: Risk for impaired skin integrity will decrease Outcome: Adequate for Discharge   Problem: Education: Goal: Understanding of CV disease, CV risk reduction, and recovery process will improve Outcome: Adequate for Discharge Goal: Individualized Educational Video(s) Outcome: Adequate for Discharge   Problem: Activity: Goal: Ability to return to baseline activity level will improve Outcome: Adequate for Discharge   Problem: Cardiovascular: Goal: Ability to achieve and maintain adequate cardiovascular perfusion will improve Outcome: Adequate for Discharge Goal: Vascular access site(s) Level 0-1 will be maintained Outcome: Adequate for Discharge   Problem: Health Behavior/Discharge Planning: Goal: Ability to safely manage health-related needs after discharge will improve Outcome: Adequate for Discharge   Problem: Acute Rehab OT Goals (only OT should resolve) Goal: Pt. Will Perform Grooming Outcome: Adequate for Discharge Goal: Pt. Will Perform Lower Body Bathing Outcome: Adequate for Discharge Goal: Pt. Will Perform Lower Body Dressing Outcome: Adequate for Discharge Goal: Pt. Will Transfer To Toilet Outcome: Adequate for Discharge Goal: Pt. Will Perform Toileting-Clothing Manipulation Outcome: Adequate for Discharge Goal: Pt. Will Perform Tub/Shower Transfer Outcome: Adequate for Discharge   Problem: Acute Rehab PT Goals(only PT should resolve) Goal: Pt Will Go Supine/Side To Sit Outcome: Adequate for Discharge Goal: Pt Will Go Sit To Supine/Side Outcome: Adequate for Discharge Goal: Patient Will Transfer Sit To/From Stand Outcome: Adequate for Discharge Goal: Pt Will Ambulate Outcome: Adequate for Discharge Goal: Pt Will Go Up/Down Stairs Outcome: Adequate for Discharge

## 2021-11-21 ENCOUNTER — Emergency Department (HOSPITAL_COMMUNITY): Payer: Medicare Other

## 2021-11-21 ENCOUNTER — Emergency Department (HOSPITAL_COMMUNITY)
Admission: EM | Admit: 2021-11-21 | Discharge: 2021-11-21 | Disposition: A | Discharge status: Home / Self Care | Payer: Medicare Other | Attending: Emergency Medicine | Admitting: Emergency Medicine

## 2021-11-21 ENCOUNTER — Other Ambulatory Visit: Payer: Self-pay

## 2021-11-21 DIAGNOSIS — Z794 Long term (current) use of insulin: Secondary | ICD-10-CM | POA: Diagnosis not present

## 2021-11-21 DIAGNOSIS — E162 Hypoglycemia, unspecified: Secondary | ICD-10-CM | POA: Diagnosis not present

## 2021-11-21 DIAGNOSIS — R001 Bradycardia, unspecified: Secondary | ICD-10-CM | POA: Insufficient documentation

## 2021-11-21 DIAGNOSIS — N189 Chronic kidney disease, unspecified: Secondary | ICD-10-CM | POA: Diagnosis not present

## 2021-11-21 DIAGNOSIS — Z79899 Other long term (current) drug therapy: Secondary | ICD-10-CM | POA: Insufficient documentation

## 2021-11-21 DIAGNOSIS — I4891 Unspecified atrial fibrillation: Secondary | ICD-10-CM | POA: Diagnosis not present

## 2021-11-21 DIAGNOSIS — R404 Transient alteration of awareness: Secondary | ICD-10-CM | POA: Diagnosis not present

## 2021-11-21 DIAGNOSIS — R059 Cough, unspecified: Secondary | ICD-10-CM | POA: Diagnosis not present

## 2021-11-21 DIAGNOSIS — E11649 Type 2 diabetes mellitus with hypoglycemia without coma: Secondary | ICD-10-CM | POA: Diagnosis not present

## 2021-11-21 DIAGNOSIS — E119 Type 2 diabetes mellitus without complications: Secondary | ICD-10-CM | POA: Diagnosis not present

## 2021-11-21 DIAGNOSIS — E161 Other hypoglycemia: Secondary | ICD-10-CM | POA: Diagnosis not present

## 2021-11-21 DIAGNOSIS — I10 Essential (primary) hypertension: Secondary | ICD-10-CM | POA: Diagnosis not present

## 2021-11-21 DIAGNOSIS — Z7901 Long term (current) use of anticoagulants: Secondary | ICD-10-CM | POA: Diagnosis not present

## 2021-11-21 DIAGNOSIS — R4781 Slurred speech: Secondary | ICD-10-CM | POA: Diagnosis not present

## 2021-11-21 LAB — CBC WITH DIFFERENTIAL/PLATELET
Abs Immature Granulocytes: 0.05 10*3/uL (ref 0.00–0.07)
Basophils Absolute: 0 10*3/uL (ref 0.0–0.1)
Basophils Relative: 0 %
Eosinophils Absolute: 0.1 10*3/uL (ref 0.0–0.5)
Eosinophils Relative: 1 %
HCT: 28.3 % — ABNORMAL LOW (ref 39.0–52.0)
Hemoglobin: 8.9 g/dL — ABNORMAL LOW (ref 13.0–17.0)
Immature Granulocytes: 1 %
Lymphocytes Relative: 16 %
Lymphs Abs: 1.6 10*3/uL (ref 0.7–4.0)
MCH: 27.6 pg (ref 26.0–34.0)
MCHC: 31.4 g/dL (ref 30.0–36.0)
MCV: 87.9 fL (ref 80.0–100.0)
Monocytes Absolute: 0.6 10*3/uL (ref 0.1–1.0)
Monocytes Relative: 5 %
Neutro Abs: 7.9 10*3/uL — ABNORMAL HIGH (ref 1.7–7.7)
Neutrophils Relative %: 77 %
Platelets: 262 10*3/uL (ref 150–400)
RBC: 3.22 MIL/uL — ABNORMAL LOW (ref 4.22–5.81)
RDW: 15.4 % (ref 11.5–15.5)
WBC: 10.2 10*3/uL (ref 4.0–10.5)
nRBC: 0 % (ref 0.0–0.2)

## 2021-11-21 LAB — COMPREHENSIVE METABOLIC PANEL
ALT: 65 U/L — ABNORMAL HIGH (ref 0–44)
AST: 19 U/L (ref 15–41)
Albumin: 2.6 g/dL — ABNORMAL LOW (ref 3.5–5.0)
Alkaline Phosphatase: 68 U/L (ref 38–126)
Anion gap: 7 (ref 5–15)
BUN: 22 mg/dL (ref 8–23)
CO2: 23 mmol/L (ref 22–32)
Calcium: 8.2 mg/dL — ABNORMAL LOW (ref 8.9–10.3)
Chloride: 108 mmol/L (ref 98–111)
Creatinine, Ser: 1.44 mg/dL — ABNORMAL HIGH (ref 0.61–1.24)
GFR, Estimated: 45 mL/min — ABNORMAL LOW (ref >60)
Glucose, Bld: 192 mg/dL — ABNORMAL HIGH (ref 70–99)
Potassium: 3.6 mmol/L (ref 3.5–5.1)
Sodium: 138 mmol/L (ref 135–145)
Total Bilirubin: 0.7 mg/dL (ref 0.3–1.2)
Total Protein: 5.4 g/dL — ABNORMAL LOW (ref 6.5–8.1)

## 2021-11-21 LAB — CBG MONITORING, ED
Glucose-Capillary: 113 mg/dL — ABNORMAL HIGH (ref 70–99)
Glucose-Capillary: 141 mg/dL — ABNORMAL HIGH (ref 70–99)

## 2021-11-21 MED ORDER — INSULIN ASPART 100 UNIT/ML IV SOLN: 10.0000 [IU] | Freq: Three times a day (TID) | INTRAVENOUS | 2 refills | Status: DC

## 2021-11-21 NOTE — ED Triage Notes (Signed)
Patient BIB EMS from home c/o hypoglycemia CBG 37. Per report D10 557m was Given by EMS. CBG went up to 190. No report of injury. Pt denies N/V.  BP 180/70 HR 55 RR 20 O2sat 98% on RA

## 2021-11-21 NOTE — Discharge Instructions (Addendum)
You need to stop taking the current insulin you are taking because there is a long-acting and short acting insulin in it.  We are going to change you to a short acting insulin and have you use a sliding scale for now until you can follow-up with your doctor. CBG < 70: Implement Hypoglycemia Standing Orders and refer to Hypoglycemia Standing Orders sidebar report CBG 70 - 120: 0 units CBG 121 - 150: 1 unit CBG 151 - 200: 2 units CBG 201 - 250: 3 units CBG 251 - 300: 5 units CBG 301 - 350: 7 units CBG 351 - 400: 9 units CBG 400-451:10 units

## 2021-11-21 NOTE — ED Provider Notes (Signed)
[facility] DEPT Provider Note   CSN: [id number] Arrival date & time: 11/21/21  1548     History  Chief Complaint  Patient presents with   Hypoglycemia    David Steele is a 88 y.o. male.  Pt is a 88y/o male with hx of CKD (Cr 1.4-1.6), atherosclerosis, renal cell carcinoma s/p R nephrectomy, IDDM, A-fib on Eliquis with recent diverticular bleed at the end of June resulting in receiving a total of 9 units of blood, FFP and going into acute kidney failure with a creatinine that peaked at 3.5 with hyperkalemia which did improve after a Foley catheter was placed to 1.8 prior to discharge who is presenting today with EMS for recurrent hypoglycemia.  Patient's family is not currently present and patient gives the history.  His Eliquis was discontinued during his last hospitalization.  He did have a CTA to evaluate for areas that could be embolized but there were none present.  He did improve and labs continue to improve prior to being discharged home.  He was discharged home with metformin and insulin.  His family has been staying with him since discharge and has been cooking patient reports he has been eating but yesterday he had an episode of his blood sugar dropping to the 20s.  He did eat and it came up but he reports he did not have any sleep last night because he was worried that his blood sugar would drop again.  He had been checking it and it got as high as 400.  He reports today he checked it and it was 144 and he was so tired he decided to take a nap.  The next thing he knew he reports the paramedics were over him and he did not know what was going on.  Paramedics report blood sugar was 37 today.  Patient reports now he feels fine however he does admit to having a productive cough over the last week that had some mucus.  He has not had a fever.  He denies any chest pain, abdominal pain, nausea vomiting or diarrhea.  He did report he was a little bit constipated  this morning and took some milk of magnesia but otherwise had no complaints.  The history is provided by the patient and medical records.  Hypoglycemia      Home Medications Prior to Admission medications   Medication Sig Start Date End Date Taking? Authorizing Provider  insulin aspart (NOVOLOG) 100 unit/mL injection Inject 10 Units into the skin 3 (three) times daily before meals. Per sliding scale provided: CBG less than 70: 0 units CBG 70 - 120: 0 units CBG 121 - 150: 1 unit CBG 151 - 200: 2 units CBG 201 - 250: 3 units CBG 251 - 300: 5 units CBG 301 - 350: 7 units CBG 351 - 400: 9 units CBG 400-451:10 units Call your doctor if remains higher than 500 11/21/21  Yes Shannin Naab, Loree Fee, MD  acetaminophen (TYLENOL) 650 MG CR tablet Take 650 mg by mouth daily.    [provider]  amLODipine (NORVASC) 2.5 MG tablet Take 1 tablet (2.5 mg total) by mouth at bedtime. 11/17/21   Linus Galas, MD  Brinzolamide-Brimonidine [facility]) 1-0.2 % SUSP Apply 1 drop to eye in the morning, at noon, and at bedtime.    [provider]  busPIRone (BUSPAR) 15 MG tablet Take 15 mg by mouth 2 (two) times daily. 10/18/21   [provider]  carvedilol (COREG) 6.25 MG tablet Take  1 tablet (6.25 mg total) by mouth 2 (two) times daily with a meal. 11/17/21   Linus Galas, MD  dorzolamide-timolol (COSOPT) 22.3-6.8 MG/ML ophthalmic solution Place 1 drop into the right eye 2 (two) times daily. 10/09/21   [provider]  ferrous sulfate 325 (65 FE) MG tablet Take 325 mg by mouth daily with breakfast.    [provider]  insulin NPH-regular Human (70-30) 100 UNIT/ML injection Inject 25 Units into the skin See admin instructions. 30 units before meal in AM and 25 units in evening before meal    [provider]  lansoprazole (PREVACID) 30 MG capsule Take 30 mg by mouth daily. 01/13/21   [provider]  metFORMIN (GLUCOPHAGE) 1000 MG tablet Take  1,000 mg by mouth 2 (two) times daily with a meal.     [provider]  mirabegron ER (MYRBETRIQ) 50 MG TB24 tablet Take 50 mg by mouth at bedtime.    [provider]  Multiple Vitamins-Minerals (CENTRUM SILVER ADULT 50+ PO) Take 1 tablet by mouth daily. Unknown strength    [provider]  ondansetron (ZOFRAN) 4 MG tablet Take 4 mg by mouth 2 (two) times daily as needed for vomiting or nausea. 10/28/21   [provider]  PARoxetine (PAXIL) 30 MG tablet Take 30 mg by mouth daily.    [provider]  tamsulosin (FLOMAX) 0.4 MG CAPS capsule Take 1 capsule (0.4 mg total) by mouth daily. 11/18/21   Linus Galas, MD  traZODone (DESYREL) 50 MG tablet Take 50 mg by mouth at bedtime as needed for sleep. 10/18/21   [provider]      Allergies    Simvastatin    Review of Systems   Review of Systems  Physical Exam Updated Vital Signs BP (!) 167/72   Pulse 63   Temp 97.7 F (36.5 C) (Oral)   Resp (!) 21   SpO2 98%  Physical Exam Vitals and nursing note reviewed.  Constitutional:      General: He is not in acute distress.    Appearance: He is well-developed.  HENT:     Head: Normocephalic and atraumatic.  Eyes:     Conjunctiva/sclera: Conjunctivae normal.     Pupils: Pupils are equal, round, and reactive to light.  Cardiovascular:     Rate and Rhythm: Regular rhythm. Bradycardia present.     Heart sounds: No murmur heard. Pulmonary:     Effort: Pulmonary effort is normal. No respiratory distress.     Breath sounds: Examination of the left-lower field reveals rhonchi. Rhonchi present. No wheezing or rales.  Abdominal:     General: There is no distension.     Palpations: Abdomen is soft.     Tenderness: There is no abdominal tenderness. There is no guarding or rebound.  Musculoskeletal:        General: No tenderness. Normal range of motion.     Cervical back: Normal range of motion and neck supple.     Comments: Healing  abrasions over the lower extremities  Skin:    General: Skin is warm and dry.     Findings: No erythema or rash.  Neurological:     Mental Status: He is alert and oriented to person, place, and time. Mental status is at baseline.  Psychiatric:        Mood and Affect: Mood normal.        Behavior: Behavior normal.     ED Results / Procedures / Treatments  Labs (all labs ordered are listed, but only abnormal results are displayed) Labs Reviewed  CBC WITH DIFFERENTIAL/PLATELET - Abnormal; Notable for the following components:      Result Value   RBC 3.22 (*)    Hemoglobin 8.9 (*)    HCT 28.3 (*)    Neutro Abs 7.9 (*)    All other components within normal limits  COMPREHENSIVE METABOLIC PANEL - Abnormal; Notable for the following components:   Glucose, Bld 192 (*)    Creatinine, Ser 1.44 (*)    Calcium 8.2 (*)    Total Protein 5.4 (*)    Albumin 2.6 (*)    ALT 65 (*)    GFR, Estimated 45 (*)    All other components within normal limits  CBG MONITORING, ED - Abnormal; Notable for the following components:   Glucose-Capillary 113 (*)    All other components within normal limits  CBG MONITORING, ED - Abnormal; Notable for the following components:   Glucose-Capillary 141 (*)    All other components within normal limits    EKG EKG Interpretation  Date/Time:  Sunday November 21 2021 16:11:30 EDT Ventricular Rate:  55 PR Interval:  302 QRS Duration: 101 QT Interval:  445 QTC Calculation: 426 R Axis:   45 Text Interpretation: Sinus rhythm Prolonged PR interval No significant change since last tracing Confirmed by Blanchie Dessert ([id number]) on 11/21/2021 5:21:47 PM  Radiology DG Chest 2 View  Result Date: 11/21/2021 CLINICAL DATA:  Cough.  History of prostate and bladder cancer. EXAM: CHEST - 2 VIEW COMPARISON:  11/02/2021 FINDINGS: The cardio pericardial silhouette is enlarged. The lungs are clear without focal pneumonia, edema, pneumothorax or pleural effusion. Possible left  pulmonary nodule versus bony callus overlying the anterior left fifth rib. The visualized bony structures of the thorax are unremarkable. Telemetry leads overlie the chest. IMPRESSION: Enlarged cardiopericardial silhouette. No acute cardiopulmonary findings. Possible pulmonary nodule or callus from rib fracture in the left lung, superimposed on the anterior fifth rib. Not definitely seen on prior study from 2 weeks ago. Consider short-term interval follow-up chest x-ray. Electronically Signed   By: Misty Stanley M.D.   On: 11/21/2021 16:37    Procedures Procedures    Medications Ordered in ED Medications - No data to display  ED Course/ Medical Decision Making/ A&P                           Medical Decision Making Amount and/or Complexity of Data Reviewed Independent Historian: EMS External Data Reviewed: notes.    Details: Recent hospitalization Labs: ordered. Decision-making details documented in ED Course. Radiology: ordered and independent interpretation performed. Decision-making details documented in ED Course.  Risk Prescription drug management.   Pt with multiple medical problems and comorbidities and presenting today with a complaint that caries a high risk for morbidity and mortality.  Presenting today with recurrent hypoglycemia.  Apparently had an episode yesterday that EMS was called but they were able to get his sugar back up with eating.  He had a repeat episode today and was unresponsive.  Sugar of 37.  He did receive D10 prior to arrival and repeat blood sugar was 190.  Patient currently has no complaints however when probed further he has had a productive cough in the last week but denies any chest pain or shortness of breath.  He does not have any fever that he is aware of.  Patient recently had hospitalization with significant diverticular bleeding  that was unable to be embolized requiring patient to receive 9 units of PRBCs, FFP with addition to ischemic hepatitis and  acute kidney injury.  Concern for possible pneumonia causing recurrent hypoglycemia today versus acute kidney injury and poor clearance of insulin.  Patient has not changed the amount of insulin or metformin that he takes and it appears he has been eating so should not be related to that.  We will continue to follow blood sugars.  Labs and imaging are pending.   9:10 PM Patient's family member is now here and confirms that patient's hypoglycemia has happened in the morning yesterday and then after he took his insulin and had some breakfast this morning he took a nap and did not have any lunch.  Patient reports that since he is gone home from the hospital they did change his insulin to 30 units in the a.m. and 25 units in the evening.  He said before he went into the hospital his doctor had changed him to 25 units a day totally but he reports that his sugar has been running very high.  Now his sugar has been running more in the low 100s but he has not been hypoglycemic in the past.  I independently interpreted patient's labs today which shows a CBC with a stable hemoglobin of 8.9 from when he left the hospital and a normal white count, CMP with his stable creatinine of 1.4 normal electrolytes and blood sugar of 192.  On repeat check several hours later without eating patient's blood sugar is 113.  We will give patient some dinner and recheck his sugar.  He appears to be eating and drinking regularly.  He seems to understand how to give himself his insulin and feel that we most likely need to adjust his evening and morning dose to avoid any further hypoglycemic episodes and have him follow-up with his PCP.  The patient and his family member are comfortable with this plan.  He is denying any shortness of breath and does not appear hypoxic. I have independently visualized and interpreted pt's images today.  Chest x-ray without acute findings today. 9:10 PM Patient has eaten a full meal, repeat sugar is 141.  At this  time feel that patient can be discharged home however will adjust insulin to hopefully avoid further hypoglycemic events.  Patient and family member feel comfortable with this plan.          Final Clinical Impression(s) / ED Diagnoses Final diagnoses:  Hypoglycemia    Rx / DC Orders ED Discharge Orders          Ordered    insulin aspart (NOVOLOG) 100 unit/mL injection  3 times daily before meals        11/21/21 2109              Blanchie Dessert, MD 11/21/21 2110

## 2021-11-23 DIAGNOSIS — K5730 Diverticulosis of large intestine without perforation or abscess without bleeding: Secondary | ICD-10-CM | POA: Diagnosis not present

## 2021-11-23 DIAGNOSIS — Z8701 Personal history of pneumonia (recurrent): Secondary | ICD-10-CM | POA: Diagnosis not present

## 2021-11-23 DIAGNOSIS — R338 Other retention of urine: Secondary | ICD-10-CM | POA: Diagnosis not present

## 2021-11-23 DIAGNOSIS — D631 Anemia in chronic kidney disease: Secondary | ICD-10-CM | POA: Diagnosis not present

## 2021-11-23 DIAGNOSIS — F329 Major depressive disorder, single episode, unspecified: Secondary | ICD-10-CM | POA: Diagnosis not present

## 2021-11-23 DIAGNOSIS — I951 Orthostatic hypotension: Secondary | ICD-10-CM | POA: Diagnosis not present

## 2021-11-23 DIAGNOSIS — I129 Hypertensive chronic kidney disease with stage 1 through stage 4 chronic kidney disease, or unspecified chronic kidney disease: Secondary | ICD-10-CM | POA: Diagnosis not present

## 2021-11-23 DIAGNOSIS — Z7984 Long term (current) use of oral hypoglycemic drugs: Secondary | ICD-10-CM | POA: Diagnosis not present

## 2021-11-23 DIAGNOSIS — Z8616 Personal history of COVID-19: Secondary | ICD-10-CM | POA: Diagnosis not present

## 2021-11-23 DIAGNOSIS — H905 Unspecified sensorineural hearing loss: Secondary | ICD-10-CM | POA: Diagnosis not present

## 2021-11-23 DIAGNOSIS — Z8546 Personal history of malignant neoplasm of prostate: Secondary | ICD-10-CM | POA: Diagnosis not present

## 2021-11-23 DIAGNOSIS — I480 Paroxysmal atrial fibrillation: Secondary | ICD-10-CM | POA: Diagnosis not present

## 2021-11-23 DIAGNOSIS — N1831 Chronic kidney disease, stage 3a: Secondary | ICD-10-CM | POA: Diagnosis not present

## 2021-11-23 DIAGNOSIS — N401 Enlarged prostate with lower urinary tract symptoms: Secondary | ICD-10-CM | POA: Diagnosis not present

## 2021-11-23 DIAGNOSIS — E1122 Type 2 diabetes mellitus with diabetic chronic kidney disease: Secondary | ICD-10-CM | POA: Diagnosis not present

## 2021-11-23 DIAGNOSIS — E785 Hyperlipidemia, unspecified: Secondary | ICD-10-CM | POA: Diagnosis not present

## 2021-11-25 DIAGNOSIS — D631 Anemia in chronic kidney disease: Secondary | ICD-10-CM | POA: Diagnosis not present

## 2021-11-25 DIAGNOSIS — N1831 Chronic kidney disease, stage 3a: Secondary | ICD-10-CM | POA: Diagnosis not present

## 2021-11-25 DIAGNOSIS — I480 Paroxysmal atrial fibrillation: Secondary | ICD-10-CM | POA: Diagnosis not present

## 2021-11-25 DIAGNOSIS — E1122 Type 2 diabetes mellitus with diabetic chronic kidney disease: Secondary | ICD-10-CM | POA: Diagnosis not present

## 2021-11-25 DIAGNOSIS — I129 Hypertensive chronic kidney disease with stage 1 through stage 4 chronic kidney disease, or unspecified chronic kidney disease: Secondary | ICD-10-CM | POA: Diagnosis not present

## 2021-11-25 DIAGNOSIS — K5730 Diverticulosis of large intestine without perforation or abscess without bleeding: Secondary | ICD-10-CM | POA: Diagnosis not present

## 2021-11-30 DIAGNOSIS — E1122 Type 2 diabetes mellitus with diabetic chronic kidney disease: Secondary | ICD-10-CM | POA: Diagnosis not present

## 2021-11-30 DIAGNOSIS — D631 Anemia in chronic kidney disease: Secondary | ICD-10-CM | POA: Diagnosis not present

## 2021-11-30 DIAGNOSIS — I129 Hypertensive chronic kidney disease with stage 1 through stage 4 chronic kidney disease, or unspecified chronic kidney disease: Secondary | ICD-10-CM | POA: Diagnosis not present

## 2021-11-30 DIAGNOSIS — N1831 Chronic kidney disease, stage 3a: Secondary | ICD-10-CM | POA: Diagnosis not present

## 2021-11-30 DIAGNOSIS — K5730 Diverticulosis of large intestine without perforation or abscess without bleeding: Secondary | ICD-10-CM | POA: Diagnosis not present

## 2021-11-30 DIAGNOSIS — I480 Paroxysmal atrial fibrillation: Secondary | ICD-10-CM | POA: Diagnosis not present

## 2021-12-02 DIAGNOSIS — E1122 Type 2 diabetes mellitus with diabetic chronic kidney disease: Secondary | ICD-10-CM | POA: Diagnosis not present

## 2021-12-02 DIAGNOSIS — I129 Hypertensive chronic kidney disease with stage 1 through stage 4 chronic kidney disease, or unspecified chronic kidney disease: Secondary | ICD-10-CM | POA: Diagnosis not present

## 2021-12-02 DIAGNOSIS — I480 Paroxysmal atrial fibrillation: Secondary | ICD-10-CM | POA: Diagnosis not present

## 2021-12-02 DIAGNOSIS — D631 Anemia in chronic kidney disease: Secondary | ICD-10-CM | POA: Diagnosis not present

## 2021-12-02 DIAGNOSIS — K5730 Diverticulosis of large intestine without perforation or abscess without bleeding: Secondary | ICD-10-CM | POA: Diagnosis not present

## 2021-12-02 DIAGNOSIS — N1831 Chronic kidney disease, stage 3a: Secondary | ICD-10-CM | POA: Diagnosis not present

## 2021-12-03 DIAGNOSIS — K5730 Diverticulosis of large intestine without perforation or abscess without bleeding: Secondary | ICD-10-CM | POA: Diagnosis not present

## 2021-12-03 DIAGNOSIS — I129 Hypertensive chronic kidney disease with stage 1 through stage 4 chronic kidney disease, or unspecified chronic kidney disease: Secondary | ICD-10-CM | POA: Diagnosis not present

## 2021-12-03 DIAGNOSIS — I480 Paroxysmal atrial fibrillation: Secondary | ICD-10-CM | POA: Diagnosis not present

## 2021-12-03 DIAGNOSIS — N1831 Chronic kidney disease, stage 3a: Secondary | ICD-10-CM | POA: Diagnosis not present

## 2021-12-03 DIAGNOSIS — E1122 Type 2 diabetes mellitus with diabetic chronic kidney disease: Secondary | ICD-10-CM | POA: Diagnosis not present

## 2021-12-03 DIAGNOSIS — D631 Anemia in chronic kidney disease: Secondary | ICD-10-CM | POA: Diagnosis not present

## 2021-12-06 ENCOUNTER — Ambulatory Visit: Payer: Medicare Other | Admitting: Cardiology

## 2021-12-06 DIAGNOSIS — D631 Anemia in chronic kidney disease: Secondary | ICD-10-CM | POA: Diagnosis not present

## 2021-12-06 DIAGNOSIS — I129 Hypertensive chronic kidney disease with stage 1 through stage 4 chronic kidney disease, or unspecified chronic kidney disease: Secondary | ICD-10-CM | POA: Diagnosis not present

## 2021-12-06 DIAGNOSIS — I480 Paroxysmal atrial fibrillation: Secondary | ICD-10-CM | POA: Diagnosis not present

## 2021-12-06 DIAGNOSIS — E1122 Type 2 diabetes mellitus with diabetic chronic kidney disease: Secondary | ICD-10-CM | POA: Diagnosis not present

## 2021-12-06 DIAGNOSIS — N1831 Chronic kidney disease, stage 3a: Secondary | ICD-10-CM | POA: Diagnosis not present

## 2021-12-06 DIAGNOSIS — K5730 Diverticulosis of large intestine without perforation or abscess without bleeding: Secondary | ICD-10-CM | POA: Diagnosis not present

## 2021-12-07 DIAGNOSIS — I129 Hypertensive chronic kidney disease with stage 1 through stage 4 chronic kidney disease, or unspecified chronic kidney disease: Secondary | ICD-10-CM | POA: Diagnosis not present

## 2021-12-07 DIAGNOSIS — K5730 Diverticulosis of large intestine without perforation or abscess without bleeding: Secondary | ICD-10-CM | POA: Diagnosis not present

## 2021-12-07 DIAGNOSIS — D631 Anemia in chronic kidney disease: Secondary | ICD-10-CM | POA: Diagnosis not present

## 2021-12-07 DIAGNOSIS — E1122 Type 2 diabetes mellitus with diabetic chronic kidney disease: Secondary | ICD-10-CM | POA: Diagnosis not present

## 2021-12-07 DIAGNOSIS — I480 Paroxysmal atrial fibrillation: Secondary | ICD-10-CM | POA: Diagnosis not present

## 2021-12-07 DIAGNOSIS — N1831 Chronic kidney disease, stage 3a: Secondary | ICD-10-CM | POA: Diagnosis not present

## 2021-12-14 ENCOUNTER — Encounter: Payer: Self-pay | Admitting: Cardiology

## 2021-12-14 ENCOUNTER — Ambulatory Visit (INDEPENDENT_AMBULATORY_CARE_PROVIDER_SITE_OTHER): Payer: Medicare Other | Admitting: Cardiology

## 2021-12-14 VITALS — BP 134/72 | HR 68 | Ht 71.0 in | Wt 192.4 lb

## 2021-12-14 DIAGNOSIS — E1169 Type 2 diabetes mellitus with other specified complication: Secondary | ICD-10-CM | POA: Diagnosis not present

## 2021-12-14 DIAGNOSIS — K5730 Diverticulosis of large intestine without perforation or abscess without bleeding: Secondary | ICD-10-CM | POA: Diagnosis not present

## 2021-12-14 DIAGNOSIS — I951 Orthostatic hypotension: Secondary | ICD-10-CM | POA: Diagnosis not present

## 2021-12-14 DIAGNOSIS — I10 Essential (primary) hypertension: Secondary | ICD-10-CM

## 2021-12-14 DIAGNOSIS — N1831 Chronic kidney disease, stage 3a: Secondary | ICD-10-CM | POA: Diagnosis not present

## 2021-12-14 DIAGNOSIS — E1122 Type 2 diabetes mellitus with diabetic chronic kidney disease: Secondary | ICD-10-CM | POA: Diagnosis not present

## 2021-12-14 DIAGNOSIS — I480 Paroxysmal atrial fibrillation: Secondary | ICD-10-CM | POA: Diagnosis not present

## 2021-12-14 DIAGNOSIS — I129 Hypertensive chronic kidney disease with stage 1 through stage 4 chronic kidney disease, or unspecified chronic kidney disease: Secondary | ICD-10-CM | POA: Diagnosis not present

## 2021-12-14 DIAGNOSIS — D631 Anemia in chronic kidney disease: Secondary | ICD-10-CM | POA: Diagnosis not present

## 2021-12-14 NOTE — Progress Notes (Signed)
Cardiology Office Note:    Date:  12/14/2021   ID:  David Steele, DOB 1925/08/20, MRN [id number]  PCP:  Ernestene Kiel, MD  Cardiologist:  Jenne Campus, MD    Referring MD: Ernestene Kiel, MD   Chief Complaint  Patient presents with   Dizziness    History of Present Illness:    David Steele is a 88 y.o. male   with past medical history significant for dizziness he did look clearly orthostatic at that time we stopped amlodipine which make him feel much better I also interestingly on the monitor did not put on him and we find episodes of nonsustained ventricular tachycardia it was completely asymptomatic he did have echocardiogram showed preserved left ventricle ejection fraction.  He comes today to my office for follow-up.  Interestingly he was put back on amlodipine and started complaint having dizziness again.  We had a long discussion about this again and I told him and his family that related to except slightly high blood pressure and situation like him to prevent him from falls because of orthostatic hypotension.  Dizziness happen when he gets up, does not happen when she sits does not happen when he lays down.  I do not think have anything to do with his ventricular tachycardia noted on the monitor.  I think the key is to control his orthostasis.  I encouraged him to drink plenty of fluid we will discontinue his amlodipine I told his family that neck step will be elastic stockings and then potentially medications to improve his blood pressure.  But we need to do gradual approach to this.  Past Medical History:  Diagnosis Date   Acute respiratory failure with hypoxia ([place]) 07/03/2019   Advanced age 55/10[age]2018   Anemia    Anxiety    Aortic atherosclerosis (HCC)    Benign essential HTN 02/23/2017   Bladder cancer (HCC)    BPH (benign prostatic hyperplasia)    Cerebral ischemia    CKD (chronic kidney disease), stage III ([place]) 07/03/2019   Current use of long term  anticoagulation 04/19/2019   Depressive disorder    Diabetes mellitus type 2, uncontrolled, with complications 00/93/8182   DOE (dyspnea on exertion) 02/23/2017   Dyslipidemia 07/01/2019   First degree AV block 12/14/2017   Former smoker 06/15/2017   History of bladder cancer 02/23/2017   HLD (hyperlipidemia) 07/03/2019   Hyperlipidemia    Hypertension    Orthostatic hypotension 08/31/2010   Paroxysmal atrial fibrillation ([place]) 02/23/2017   Paroxysmal ventricular tachycardia ([place]) 05/19/2020   Plantar fasciitis    Pneumonia due to COVID-19 virus 07/01/2019   Profound sensorineural hearing loss ([facility]) 11/13/2017   Formatting of this note might be different from the original. Added automatically from request for surgery [id number]   Prostate cancer ([place]) 07/03/2019   Sinus bradycardia 02/23/2017   T2DM (type 2 diabetes mellitus) ([place]) 07/01/2019    Past Surgical History:  Procedure Laterality Date   BLADDER SURGERY  2007   Cancerous tumor removed   EYE SURGERY     IR ANGIOGRAM SELECTIVE EACH ADDITIONAL VESSEL  11/11/2021   IR ANGIOGRAM SELECTIVE EACH ADDITIONAL VESSEL  11/11/2021   IR ANGIOGRAM SELECTIVE EACH ADDITIONAL VESSEL  11/11/2021   IR ANGIOGRAM VISCERAL SELECTIVE  11/11/2021   IR ANGIOGRAM VISCERAL SELECTIVE  11/11/2021   IR US GUIDE VASC ACCESS RIGHT  11/11/2021   TONSILLECTOMY      Current Medications: Current Meds  Medication Sig   acetaminophen (TYLENOL) 650 MG CR tablet Take  650 mg by mouth daily.   amLODipine (NORVASC) 2.5 MG tablet Take 1 tablet (2.5 mg total) by mouth at bedtime.   Brinzolamide-Brimonidine (SIMBRINZA) 1-0.2 % SUSP Apply 1 drop to eye in the morning, at noon, and at bedtime.   busPIRone (BUSPAR) 15 MG tablet Take 15 mg by mouth 2 (two) times daily.   carvedilol (COREG) 6.25 MG tablet Take 1 tablet (6.25 mg total) by mouth 2 (two) times daily with a meal.   dorzolamide-timolol (COSOPT) 22.3-6.8 MG/ML ophthalmic solution Place 1 drop into the right eye  2 (two) times daily.   ferrous sulfate 325 (65 FE) MG tablet Take 325 mg by mouth daily with breakfast.   insulin aspart (NOVOLOG) 100 unit/mL injection Inject 10 Units into the skin 3 (three) times daily before meals. Per sliding scale provided: CBG less than 70: 0 units CBG 70 - 120: 0 units CBG 121 - 150: 1 unit CBG 151 - 200: 2 units CBG 201 - 250: 3 units CBG 251 - 300: 5 units CBG 301 - 350: 7 units CBG 351 - 400: 9 units CBG 400-451:10 units Call your doctor if remains higher than 500   insulin NPH-regular Human (70-30) 100 UNIT/ML injection Inject 25 Units into the skin See admin instructions. 30 units before meal in AM and 25 units in evening before meal   lansoprazole (PREVACID) 30 MG capsule Take 30 mg by mouth daily.   metFORMIN (GLUCOPHAGE) 1000 MG tablet Take 1,000 mg by mouth 2 (two) times daily with a meal.    mirabegron ER (MYRBETRIQ) 50 MG TB24 tablet Take 50 mg by mouth at bedtime.   Multiple Vitamins-Minerals (CENTRUM SILVER ADULT 50+ PO) Take 1 tablet by mouth daily. Unknown strength   ondansetron (ZOFRAN) 4 MG tablet Take 4 mg by mouth 2 (two) times daily as needed for vomiting or nausea.   PARoxetine (PAXIL) 30 MG tablet Take 30 mg by mouth daily.   tamsulosin (FLOMAX) 0.4 MG CAPS capsule Take 1 capsule (0.4 mg total) by mouth daily.   traZODone (DESYREL) 50 MG tablet Take 50 mg by mouth at bedtime as needed for sleep.     Allergies:   Simvastatin   Social History   Socioeconomic History   Marital status: Single    Spouse name: Not on file   Number of children: Not on file   Years of education: Not on file   Highest education level: Not on file  Occupational History   Occupation: reired  Tobacco Use   Smoking status: Former    Types: Cigarettes    Quit date: 05/16/1988    Years since quitting: 33.6   Smokeless tobacco: Never  Vaping Use   Vaping Use: Former  Substance and Sexual Activity   Alcohol use: No   Drug use: No   Sexual activity: Not  Currently  Other Topics Concern   Not on file  Social History Narrative   Not on file   Social Determinants of Health   Financial Resource Strain: Not on file  Food Insecurity: Not on file  Transportation Needs: Not on file  Physical Activity: Not on file  Stress: Not on file  Social Connections: Not on file     Family History: The patient's family history is not on file. ROS:   Please see the history of present illness.    All 14 point review of systems negative except as described per history of present illness  EKGs/Labs/Other Studies Reviewed:  Recent Labs: 11/15/2021: Magnesium 1.6 11/21/2021: ALT 65; BUN 22; Creatinine, Ser 1.44; Hemoglobin 8.9; Platelets 262; Potassium 3.6; Sodium 138  Recent Lipid Panel    Component Value Date/Time   CHOL 138 07/04/2019 0210   TRIG 35 07/04/2019 0210   HDL 36 (L) 07/04/2019 0210   CHOLHDL 3.8 07/04/2019 0210   VLDL 7 07/04/2019 0210   LDLCALC 95 07/04/2019 0210    Physical Exam:    VS:  BP 134/72 (BP Location: Left Arm, Patient Position: Sitting)   Pulse 68   Ht '5\' 11"'$  (1.803 m)   Wt 192 lb 6.4 oz (87.3 kg)   SpO2 90%   BMI 26.83 kg/m     Wt Readings from Last 3 Encounters:  12/14/21 192 lb 6.4 oz (87.3 kg)  11/17/21 179 lb 0.2 oz (81.2 kg)  07/15/21 212 lb (96.2 kg)     GEN:  Well nourished, well developed in no acute distress HEENT: Normal NECK: No JVD; No carotid bruits LYMPHATICS: No lymphadenopathy CARDIAC: RRR, no murmurs, no rubs, no gallops RESPIRATORY:  Clear to auscultation without rales, wheezing or rhonchi  ABDOMEN: Soft, non-tender, non-distended MUSCULOSKELETAL:  No edema; No deformity  SKIN: Warm and dry LOWER EXTREMITIES: no swelling NEUROLOGIC:  Alert and oriented x 3 PSYCHIATRIC:  Normal affect   ASSESSMENT:    1. Orthostatic hypotension   2. Benign essential HTN   3. Primary hypertension   4. Type 2 diabetes mellitus with other specified complication, without long-term current use of  insulin (HCC)    PLAN:    In order of problems listed above:  Orthostatic hypotension: Plan as described above still uncontrolled Benign essential hypertension seems to be controlled Type 2 diabetes followed by internal medicine team History of nonsustained ventricular tachycardia but echocardiogram preserved ejection fraction.  And the way he described dizziness look clearly orthostatic   Medication Adjustments/Labs and Tests Ordered: Current medicines are reviewed at length with the patient today.  Concerns regarding medicines are outlined above.  No orders of the defined types were placed in this encounter.  Medication changes: No orders of the defined types were placed in this encounter.   Signed, Park Liter, MD, [facility] 12/14/2021 9:43 AM    [place]

## 2021-12-14 NOTE — Patient Instructions (Signed)
Medication Instructions:  Your physician has recommended you make the following change in your medication:  Discontinue Amlodipine  *If you need a refill on your cardiac medications before your next appointment, please call your pharmacy*   Lab Work: NONE If you have labs (blood work) drawn today and your tests are completely normal, you will receive your results only by: Alicia (if you have MyChart) OR A paper copy in the mail If you have any lab test that is abnormal or we need to change your treatment, we will call you to review the results.   Testing/Procedures: NONE   Follow-Up: At [facility], you and your health needs are our priority.  As part of our continuing mission to provide you with exceptional heart care, we have created designated Provider Care Teams.  These Care Teams include your primary Cardiologist (physician) and Advanced Practice Providers (APPs -  Physician Assistants and Nurse Practitioners) who all work together to provide you with the care you need, when you need it.  We recommend signing up for the patient portal called "MyChart".  Sign up information is provided on this After Visit Summary.  MyChart is used to connect with patients for Virtual Visits (Telemedicine).  Patients are able to view lab/test results, encounter notes, upcoming appointments, etc.  Non-urgent messages can be sent to your provider as well.   To learn more about what you can do with MyChart, go to [email or web address].    Your next appointment:   3 month(s)  The format for your next appointment:   In Person  Provider:   Jenne Campus, MD    Other Instructions   Important Information About Sugar

## 2021-12-17 DIAGNOSIS — I480 Paroxysmal atrial fibrillation: Secondary | ICD-10-CM | POA: Diagnosis not present

## 2021-12-17 DIAGNOSIS — N1831 Chronic kidney disease, stage 3a: Secondary | ICD-10-CM | POA: Diagnosis not present

## 2021-12-17 DIAGNOSIS — K5730 Diverticulosis of large intestine without perforation or abscess without bleeding: Secondary | ICD-10-CM | POA: Diagnosis not present

## 2021-12-17 DIAGNOSIS — I129 Hypertensive chronic kidney disease with stage 1 through stage 4 chronic kidney disease, or unspecified chronic kidney disease: Secondary | ICD-10-CM | POA: Diagnosis not present

## 2021-12-17 DIAGNOSIS — D631 Anemia in chronic kidney disease: Secondary | ICD-10-CM | POA: Diagnosis not present

## 2021-12-17 DIAGNOSIS — E1122 Type 2 diabetes mellitus with diabetic chronic kidney disease: Secondary | ICD-10-CM | POA: Diagnosis not present

## 2021-12-20 DIAGNOSIS — E1122 Type 2 diabetes mellitus with diabetic chronic kidney disease: Secondary | ICD-10-CM | POA: Diagnosis not present

## 2021-12-20 DIAGNOSIS — K5730 Diverticulosis of large intestine without perforation or abscess without bleeding: Secondary | ICD-10-CM | POA: Diagnosis not present

## 2021-12-20 DIAGNOSIS — N1831 Chronic kidney disease, stage 3a: Secondary | ICD-10-CM | POA: Diagnosis not present

## 2021-12-20 DIAGNOSIS — D631 Anemia in chronic kidney disease: Secondary | ICD-10-CM | POA: Diagnosis not present

## 2021-12-20 DIAGNOSIS — I129 Hypertensive chronic kidney disease with stage 1 through stage 4 chronic kidney disease, or unspecified chronic kidney disease: Secondary | ICD-10-CM | POA: Diagnosis not present

## 2021-12-20 DIAGNOSIS — I480 Paroxysmal atrial fibrillation: Secondary | ICD-10-CM | POA: Diagnosis not present

## 2021-12-21 DIAGNOSIS — F419 Anxiety disorder, unspecified: Secondary | ICD-10-CM | POA: Diagnosis not present

## 2021-12-21 DIAGNOSIS — E1165 Type 2 diabetes mellitus with hyperglycemia: Secondary | ICD-10-CM | POA: Diagnosis not present

## 2021-12-21 DIAGNOSIS — K219 Gastro-esophageal reflux disease without esophagitis: Secondary | ICD-10-CM | POA: Diagnosis not present

## 2021-12-21 DIAGNOSIS — I10 Essential (primary) hypertension: Secondary | ICD-10-CM | POA: Diagnosis not present

## 2021-12-21 DIAGNOSIS — G4700 Insomnia, unspecified: Secondary | ICD-10-CM | POA: Diagnosis not present

## 2021-12-21 DIAGNOSIS — R42 Dizziness and giddiness: Secondary | ICD-10-CM | POA: Diagnosis not present

## 2021-12-21 DIAGNOSIS — I480 Paroxysmal atrial fibrillation: Secondary | ICD-10-CM | POA: Diagnosis not present

## 2021-12-23 DIAGNOSIS — I480 Paroxysmal atrial fibrillation: Secondary | ICD-10-CM | POA: Diagnosis not present

## 2021-12-23 DIAGNOSIS — K5730 Diverticulosis of large intestine without perforation or abscess without bleeding: Secondary | ICD-10-CM | POA: Diagnosis not present

## 2021-12-23 DIAGNOSIS — I129 Hypertensive chronic kidney disease with stage 1 through stage 4 chronic kidney disease, or unspecified chronic kidney disease: Secondary | ICD-10-CM | POA: Diagnosis not present

## 2021-12-23 DIAGNOSIS — N401 Enlarged prostate with lower urinary tract symptoms: Secondary | ICD-10-CM | POA: Diagnosis not present

## 2021-12-23 DIAGNOSIS — D631 Anemia in chronic kidney disease: Secondary | ICD-10-CM | POA: Diagnosis not present

## 2021-12-23 DIAGNOSIS — Z7984 Long term (current) use of oral hypoglycemic drugs: Secondary | ICD-10-CM | POA: Diagnosis not present

## 2021-12-23 DIAGNOSIS — N1831 Chronic kidney disease, stage 3a: Secondary | ICD-10-CM | POA: Diagnosis not present

## 2021-12-23 DIAGNOSIS — Z8616 Personal history of COVID-19: Secondary | ICD-10-CM | POA: Diagnosis not present

## 2021-12-23 DIAGNOSIS — F329 Major depressive disorder, single episode, unspecified: Secondary | ICD-10-CM | POA: Diagnosis not present

## 2021-12-23 DIAGNOSIS — Z8701 Personal history of pneumonia (recurrent): Secondary | ICD-10-CM | POA: Diagnosis not present

## 2021-12-23 DIAGNOSIS — Z8546 Personal history of malignant neoplasm of prostate: Secondary | ICD-10-CM | POA: Diagnosis not present

## 2021-12-23 DIAGNOSIS — E785 Hyperlipidemia, unspecified: Secondary | ICD-10-CM | POA: Diagnosis not present

## 2021-12-23 DIAGNOSIS — E1122 Type 2 diabetes mellitus with diabetic chronic kidney disease: Secondary | ICD-10-CM | POA: Diagnosis not present

## 2021-12-23 DIAGNOSIS — H905 Unspecified sensorineural hearing loss: Secondary | ICD-10-CM | POA: Diagnosis not present

## 2021-12-23 DIAGNOSIS — I951 Orthostatic hypotension: Secondary | ICD-10-CM | POA: Diagnosis not present

## 2021-12-23 DIAGNOSIS — R338 Other retention of urine: Secondary | ICD-10-CM | POA: Diagnosis not present

## 2021-12-24 DIAGNOSIS — D631 Anemia in chronic kidney disease: Secondary | ICD-10-CM | POA: Diagnosis not present

## 2021-12-24 DIAGNOSIS — K5730 Diverticulosis of large intestine without perforation or abscess without bleeding: Secondary | ICD-10-CM | POA: Diagnosis not present

## 2021-12-24 DIAGNOSIS — I480 Paroxysmal atrial fibrillation: Secondary | ICD-10-CM | POA: Diagnosis not present

## 2021-12-24 DIAGNOSIS — E1122 Type 2 diabetes mellitus with diabetic chronic kidney disease: Secondary | ICD-10-CM | POA: Diagnosis not present

## 2021-12-24 DIAGNOSIS — I129 Hypertensive chronic kidney disease with stage 1 through stage 4 chronic kidney disease, or unspecified chronic kidney disease: Secondary | ICD-10-CM | POA: Diagnosis not present

## 2021-12-24 DIAGNOSIS — N1831 Chronic kidney disease, stage 3a: Secondary | ICD-10-CM | POA: Diagnosis not present

## 2021-12-27 DIAGNOSIS — N281 Cyst of kidney, acquired: Secondary | ICD-10-CM | POA: Diagnosis not present

## 2021-12-27 DIAGNOSIS — K5730 Diverticulosis of large intestine without perforation or abscess without bleeding: Secondary | ICD-10-CM | POA: Diagnosis not present

## 2021-12-27 DIAGNOSIS — C641 Malignant neoplasm of right kidney, except renal pelvis: Secondary | ICD-10-CM | POA: Diagnosis not present

## 2021-12-28 DIAGNOSIS — E1122 Type 2 diabetes mellitus with diabetic chronic kidney disease: Secondary | ICD-10-CM | POA: Diagnosis not present

## 2021-12-28 DIAGNOSIS — K5730 Diverticulosis of large intestine without perforation or abscess without bleeding: Secondary | ICD-10-CM | POA: Diagnosis not present

## 2021-12-28 DIAGNOSIS — N1831 Chronic kidney disease, stage 3a: Secondary | ICD-10-CM | POA: Diagnosis not present

## 2021-12-28 DIAGNOSIS — D631 Anemia in chronic kidney disease: Secondary | ICD-10-CM | POA: Diagnosis not present

## 2021-12-28 DIAGNOSIS — I129 Hypertensive chronic kidney disease with stage 1 through stage 4 chronic kidney disease, or unspecified chronic kidney disease: Secondary | ICD-10-CM | POA: Diagnosis not present

## 2021-12-28 DIAGNOSIS — I480 Paroxysmal atrial fibrillation: Secondary | ICD-10-CM | POA: Diagnosis not present

## 2021-12-31 DIAGNOSIS — N1831 Chronic kidney disease, stage 3a: Secondary | ICD-10-CM | POA: Diagnosis not present

## 2021-12-31 DIAGNOSIS — E1122 Type 2 diabetes mellitus with diabetic chronic kidney disease: Secondary | ICD-10-CM | POA: Diagnosis not present

## 2021-12-31 DIAGNOSIS — D631 Anemia in chronic kidney disease: Secondary | ICD-10-CM | POA: Diagnosis not present

## 2021-12-31 DIAGNOSIS — I480 Paroxysmal atrial fibrillation: Secondary | ICD-10-CM | POA: Diagnosis not present

## 2021-12-31 DIAGNOSIS — K5730 Diverticulosis of large intestine without perforation or abscess without bleeding: Secondary | ICD-10-CM | POA: Diagnosis not present

## 2021-12-31 DIAGNOSIS — I129 Hypertensive chronic kidney disease with stage 1 through stage 4 chronic kidney disease, or unspecified chronic kidney disease: Secondary | ICD-10-CM | POA: Diagnosis not present

## 2022-01-03 ENCOUNTER — Telehealth: Payer: Self-pay

## 2022-01-03 DIAGNOSIS — Z6825 Body mass index (BMI) 25.0-25.9, adult: Secondary | ICD-10-CM | POA: Diagnosis not present

## 2022-01-03 DIAGNOSIS — J449 Chronic obstructive pulmonary disease, unspecified: Secondary | ICD-10-CM | POA: Diagnosis not present

## 2022-01-03 DIAGNOSIS — Z87891 Personal history of nicotine dependence: Secondary | ICD-10-CM | POA: Diagnosis not present

## 2022-01-03 DIAGNOSIS — I951 Orthostatic hypotension: Secondary | ICD-10-CM | POA: Diagnosis present

## 2022-01-03 DIAGNOSIS — M1990 Unspecified osteoarthritis, unspecified site: Secondary | ICD-10-CM | POA: Diagnosis not present

## 2022-01-03 DIAGNOSIS — R531 Weakness: Secondary | ICD-10-CM | POA: Diagnosis not present

## 2022-01-03 DIAGNOSIS — E119 Type 2 diabetes mellitus without complications: Secondary | ICD-10-CM | POA: Diagnosis not present

## 2022-01-03 DIAGNOSIS — E43 Unspecified severe protein-calorie malnutrition: Secondary | ICD-10-CM | POA: Diagnosis not present

## 2022-01-03 DIAGNOSIS — R42 Dizziness and giddiness: Secondary | ICD-10-CM | POA: Diagnosis not present

## 2022-01-03 DIAGNOSIS — I10 Essential (primary) hypertension: Secondary | ICD-10-CM | POA: Diagnosis not present

## 2022-01-03 DIAGNOSIS — Z79899 Other long term (current) drug therapy: Secondary | ICD-10-CM | POA: Diagnosis not present

## 2022-01-03 DIAGNOSIS — E872 Acidosis, unspecified: Secondary | ICD-10-CM | POA: Diagnosis not present

## 2022-01-03 DIAGNOSIS — N400 Enlarged prostate without lower urinary tract symptoms: Secondary | ICD-10-CM | POA: Diagnosis not present

## 2022-01-03 DIAGNOSIS — Z7901 Long term (current) use of anticoagulants: Secondary | ICD-10-CM | POA: Diagnosis not present

## 2022-01-03 DIAGNOSIS — E871 Hypo-osmolality and hyponatremia: Secondary | ICD-10-CM | POA: Diagnosis not present

## 2022-01-03 DIAGNOSIS — F419 Anxiety disorder, unspecified: Secondary | ICD-10-CM | POA: Diagnosis not present

## 2022-01-03 DIAGNOSIS — E861 Hypovolemia: Secondary | ICD-10-CM | POA: Diagnosis not present

## 2022-01-03 DIAGNOSIS — R55 Syncope and collapse: Secondary | ICD-10-CM | POA: Diagnosis not present

## 2022-01-03 DIAGNOSIS — D649 Anemia, unspecified: Secondary | ICD-10-CM | POA: Diagnosis not present

## 2022-01-03 DIAGNOSIS — Z794 Long term (current) use of insulin: Secondary | ICD-10-CM | POA: Diagnosis not present

## 2022-01-03 DIAGNOSIS — Z20822 Contact with and (suspected) exposure to covid-19: Secondary | ICD-10-CM | POA: Diagnosis not present

## 2022-01-03 DIAGNOSIS — F32A Depression, unspecified: Secondary | ICD-10-CM | POA: Diagnosis not present

## 2022-01-03 NOTE — Telephone Encounter (Signed)
error 

## 2022-01-07 DIAGNOSIS — N1831 Chronic kidney disease, stage 3a: Secondary | ICD-10-CM | POA: Diagnosis not present

## 2022-01-07 DIAGNOSIS — E1122 Type 2 diabetes mellitus with diabetic chronic kidney disease: Secondary | ICD-10-CM | POA: Diagnosis not present

## 2022-01-07 DIAGNOSIS — I129 Hypertensive chronic kidney disease with stage 1 through stage 4 chronic kidney disease, or unspecified chronic kidney disease: Secondary | ICD-10-CM | POA: Diagnosis not present

## 2022-01-07 DIAGNOSIS — I480 Paroxysmal atrial fibrillation: Secondary | ICD-10-CM | POA: Diagnosis not present

## 2022-01-07 DIAGNOSIS — K5730 Diverticulosis of large intestine without perforation or abscess without bleeding: Secondary | ICD-10-CM | POA: Diagnosis not present

## 2022-01-07 DIAGNOSIS — D631 Anemia in chronic kidney disease: Secondary | ICD-10-CM | POA: Diagnosis not present

## 2022-01-10 ENCOUNTER — Other Ambulatory Visit: Payer: Self-pay

## 2022-01-10 DIAGNOSIS — Z6826 Body mass index (BMI) 26.0-26.9, adult: Secondary | ICD-10-CM | POA: Diagnosis not present

## 2022-01-10 DIAGNOSIS — I10 Essential (primary) hypertension: Secondary | ICD-10-CM | POA: Diagnosis not present

## 2022-01-10 DIAGNOSIS — E1169 Type 2 diabetes mellitus with other specified complication: Secondary | ICD-10-CM

## 2022-01-10 DIAGNOSIS — Z79899 Other long term (current) drug therapy: Secondary | ICD-10-CM | POA: Diagnosis not present

## 2022-01-10 DIAGNOSIS — E1140 Type 2 diabetes mellitus with diabetic neuropathy, unspecified: Secondary | ICD-10-CM | POA: Diagnosis not present

## 2022-01-10 DIAGNOSIS — D649 Anemia, unspecified: Secondary | ICD-10-CM | POA: Diagnosis not present

## 2022-01-11 ENCOUNTER — Telehealth: Payer: Self-pay | Admitting: *Deleted

## 2022-01-11 NOTE — Chronic Care Management (AMB) (Signed)
  Care Coordination  Outreach Note  01/11/2022 Name: David Steele MRN: [id number] DOB: [birth year]   Care Coordination Outreach Attempts  An unsuccessful telephone outreach was attempted today to offer the patient information about available care coordination services as a benefit of their health plan.   Received referral   Follow Up Plan:  Additional outreach attempts will be made to offer the patient care coordination information and services.   Encounter Outcome:  No Answer  Julian Hy, [place] Direct Dial: [phone]

## 2022-01-12 DIAGNOSIS — I129 Hypertensive chronic kidney disease with stage 1 through stage 4 chronic kidney disease, or unspecified chronic kidney disease: Secondary | ICD-10-CM | POA: Diagnosis not present

## 2022-01-12 DIAGNOSIS — N1831 Chronic kidney disease, stage 3a: Secondary | ICD-10-CM | POA: Diagnosis not present

## 2022-01-12 DIAGNOSIS — D631 Anemia in chronic kidney disease: Secondary | ICD-10-CM | POA: Diagnosis not present

## 2022-01-12 DIAGNOSIS — E1122 Type 2 diabetes mellitus with diabetic chronic kidney disease: Secondary | ICD-10-CM | POA: Diagnosis not present

## 2022-01-12 DIAGNOSIS — I480 Paroxysmal atrial fibrillation: Secondary | ICD-10-CM | POA: Diagnosis not present

## 2022-01-12 DIAGNOSIS — K5730 Diverticulosis of large intestine without perforation or abscess without bleeding: Secondary | ICD-10-CM | POA: Diagnosis not present

## 2022-01-13 NOTE — Chronic Care Management (AMB) (Signed)
  Care Coordination  Outreach Note  01/13/2022 Name: Waller Marcussen MRN: [id number] DOB: December 05, 1925   Care Coordination Outreach Attempts  A second unsuccessful outreach was attempted today to offer the patient with information about available care coordination services as a benefit of their health plan.     Follow Up Plan:  Additional outreach attempts will be made to offer the patient care coordination information and services.   Encounter Outcome:  No Answer  Julian Hy, [place] Direct Dial: [phone]

## 2022-01-18 DIAGNOSIS — D631 Anemia in chronic kidney disease: Secondary | ICD-10-CM | POA: Diagnosis not present

## 2022-01-18 DIAGNOSIS — I129 Hypertensive chronic kidney disease with stage 1 through stage 4 chronic kidney disease, or unspecified chronic kidney disease: Secondary | ICD-10-CM | POA: Diagnosis not present

## 2022-01-18 DIAGNOSIS — I480 Paroxysmal atrial fibrillation: Secondary | ICD-10-CM | POA: Diagnosis not present

## 2022-01-18 DIAGNOSIS — N1831 Chronic kidney disease, stage 3a: Secondary | ICD-10-CM | POA: Diagnosis not present

## 2022-01-18 DIAGNOSIS — K5730 Diverticulosis of large intestine without perforation or abscess without bleeding: Secondary | ICD-10-CM | POA: Diagnosis not present

## 2022-01-18 DIAGNOSIS — E1122 Type 2 diabetes mellitus with diabetic chronic kidney disease: Secondary | ICD-10-CM | POA: Diagnosis not present

## 2022-01-18 NOTE — Chronic Care Management (AMB) (Signed)
  Care Coordination   Note   01/18/2022 Name: David Steele MRN: [id number] DOB: April 20, 1926  David Steele is a 88 y.o. year old male who sees Prochnau, Chrys Racer, MD for primary care. I reached out to [name] by phone today to offer care coordination services.  David Steele was given information about Care Coordination services today including:   The Care Coordination services include support from the care team which includes your Nurse Coordinator, Clinical Social Worker, or Pharmacist.  The Care Coordination team is here to help remove barriers to the health concerns and goals most important to you. Care Coordination services are voluntary, and the patient may decline or stop services at any time by request to their care team member.   Care Coordination Consent Status: Patient agreed to services and verbal consent obtained.   Follow up plan:  Telephone appointment with care coordination team member scheduled for:  01/20/2022 scheduled from referral   Encounter Outcome:  Pt. Scheduled  Julian Hy, [place] Direct Dial: [phone]

## 2022-01-19 DIAGNOSIS — K5730 Diverticulosis of large intestine without perforation or abscess without bleeding: Secondary | ICD-10-CM | POA: Diagnosis not present

## 2022-01-19 DIAGNOSIS — E1122 Type 2 diabetes mellitus with diabetic chronic kidney disease: Secondary | ICD-10-CM | POA: Diagnosis not present

## 2022-01-19 DIAGNOSIS — I129 Hypertensive chronic kidney disease with stage 1 through stage 4 chronic kidney disease, or unspecified chronic kidney disease: Secondary | ICD-10-CM | POA: Diagnosis not present

## 2022-01-19 DIAGNOSIS — I480 Paroxysmal atrial fibrillation: Secondary | ICD-10-CM | POA: Diagnosis not present

## 2022-01-19 DIAGNOSIS — N1831 Chronic kidney disease, stage 3a: Secondary | ICD-10-CM | POA: Diagnosis not present

## 2022-01-19 DIAGNOSIS — D631 Anemia in chronic kidney disease: Secondary | ICD-10-CM | POA: Diagnosis not present

## 2022-01-20 ENCOUNTER — Ambulatory Visit: Payer: Self-pay

## 2022-01-20 NOTE — Patient Outreach (Signed)
  Care Coordination   Initial Visit Note   01/20/2022 Name: David Steele MRN: [id number] DOB: 01-09-1926  David Steele is a 88 y.o. year old male who sees Steele, David Racer, MD for primary care. I spoke with  David Steele by phone today. Patient provided me permission to speak with son David Steele.  Did briefly speak with patient who told me his main concern is his knee pain.  What matters to the patients health and wellness today?  " My knees hurt and I am having trouble walking"    Goals Addressed               This Visit's Progress     Patient Stated (pt-stated)        Care Coordination Interventions: Discussed importance of adherence to all scheduled medical appointments Counseled on the importance of reporting any/all new or changed pain symptoms or management strategies to pain management provider Discussed use of relaxation techniques and/or diversional activities to assist with pain reduction (distraction, imagery, relaxation, massage, acupressure, TENS, heat, and cold application Reviewed with patient prescribed pharmacological and nonpharmacological pain relief strategies Advised patient to discuss pain  with provider Assessed social determinant of health barriers Reviewed pain with patient. Encouraged son to call ortho and schedule and appointment Encouraged ice, otc muscle rubs Reviewed fall prevention          SDOH assessments and interventions completed:  Yes  SDOH Interventions Today    Flowsheet Row Most Recent Value  SDOH Interventions   Food Insecurity Interventions Intervention Not Indicated  Housing Interventions Intervention Not Indicated  Transportation Interventions Intervention Not Indicated  Utilities Interventions Intervention Not Indicated        Care Coordination Interventions Activated:  Yes  Care Coordination Interventions:  Yes, provided   Follow up plan: Follow up call scheduled for 02/17/2022    Encounter Outcome:  Pt.  Visit Completed   Tomasa Rand, RN, BSN, CEN [place] Coordinator [phone]

## 2022-01-22 DIAGNOSIS — K5730 Diverticulosis of large intestine without perforation or abscess without bleeding: Secondary | ICD-10-CM | POA: Diagnosis not present

## 2022-01-22 DIAGNOSIS — E1122 Type 2 diabetes mellitus with diabetic chronic kidney disease: Secondary | ICD-10-CM | POA: Diagnosis not present

## 2022-01-22 DIAGNOSIS — N1831 Chronic kidney disease, stage 3a: Secondary | ICD-10-CM | POA: Diagnosis not present

## 2022-01-22 DIAGNOSIS — Z794 Long term (current) use of insulin: Secondary | ICD-10-CM | POA: Diagnosis not present

## 2022-01-22 DIAGNOSIS — I951 Orthostatic hypotension: Secondary | ICD-10-CM | POA: Diagnosis not present

## 2022-01-22 DIAGNOSIS — E785 Hyperlipidemia, unspecified: Secondary | ICD-10-CM | POA: Diagnosis not present

## 2022-01-22 DIAGNOSIS — I129 Hypertensive chronic kidney disease with stage 1 through stage 4 chronic kidney disease, or unspecified chronic kidney disease: Secondary | ICD-10-CM | POA: Diagnosis not present

## 2022-01-22 DIAGNOSIS — F329 Major depressive disorder, single episode, unspecified: Secondary | ICD-10-CM | POA: Diagnosis not present

## 2022-01-22 DIAGNOSIS — N401 Enlarged prostate with lower urinary tract symptoms: Secondary | ICD-10-CM | POA: Diagnosis not present

## 2022-01-22 DIAGNOSIS — R338 Other retention of urine: Secondary | ICD-10-CM | POA: Diagnosis not present

## 2022-01-22 DIAGNOSIS — Z7984 Long term (current) use of oral hypoglycemic drugs: Secondary | ICD-10-CM | POA: Diagnosis not present

## 2022-01-22 DIAGNOSIS — Z8616 Personal history of COVID-19: Secondary | ICD-10-CM | POA: Diagnosis not present

## 2022-01-22 DIAGNOSIS — H905 Unspecified sensorineural hearing loss: Secondary | ICD-10-CM | POA: Diagnosis not present

## 2022-01-22 DIAGNOSIS — Z8546 Personal history of malignant neoplasm of prostate: Secondary | ICD-10-CM | POA: Diagnosis not present

## 2022-01-22 DIAGNOSIS — Z8701 Personal history of pneumonia (recurrent): Secondary | ICD-10-CM | POA: Diagnosis not present

## 2022-01-22 DIAGNOSIS — I480 Paroxysmal atrial fibrillation: Secondary | ICD-10-CM | POA: Diagnosis not present

## 2022-01-22 DIAGNOSIS — D631 Anemia in chronic kidney disease: Secondary | ICD-10-CM | POA: Diagnosis not present

## 2022-01-25 DIAGNOSIS — N1831 Chronic kidney disease, stage 3a: Secondary | ICD-10-CM | POA: Diagnosis not present

## 2022-01-25 DIAGNOSIS — K5730 Diverticulosis of large intestine without perforation or abscess without bleeding: Secondary | ICD-10-CM | POA: Diagnosis not present

## 2022-01-25 DIAGNOSIS — D631 Anemia in chronic kidney disease: Secondary | ICD-10-CM | POA: Diagnosis not present

## 2022-01-25 DIAGNOSIS — I129 Hypertensive chronic kidney disease with stage 1 through stage 4 chronic kidney disease, or unspecified chronic kidney disease: Secondary | ICD-10-CM | POA: Diagnosis not present

## 2022-01-25 DIAGNOSIS — E1122 Type 2 diabetes mellitus with diabetic chronic kidney disease: Secondary | ICD-10-CM | POA: Diagnosis not present

## 2022-01-25 DIAGNOSIS — I951 Orthostatic hypotension: Secondary | ICD-10-CM | POA: Diagnosis not present

## 2022-01-27 DIAGNOSIS — E1122 Type 2 diabetes mellitus with diabetic chronic kidney disease: Secondary | ICD-10-CM | POA: Diagnosis not present

## 2022-01-27 DIAGNOSIS — I951 Orthostatic hypotension: Secondary | ICD-10-CM | POA: Diagnosis not present

## 2022-01-27 DIAGNOSIS — K5730 Diverticulosis of large intestine without perforation or abscess without bleeding: Secondary | ICD-10-CM | POA: Diagnosis not present

## 2022-01-27 DIAGNOSIS — I129 Hypertensive chronic kidney disease with stage 1 through stage 4 chronic kidney disease, or unspecified chronic kidney disease: Secondary | ICD-10-CM | POA: Diagnosis not present

## 2022-01-27 DIAGNOSIS — N1831 Chronic kidney disease, stage 3a: Secondary | ICD-10-CM | POA: Diagnosis not present

## 2022-01-27 DIAGNOSIS — D631 Anemia in chronic kidney disease: Secondary | ICD-10-CM | POA: Diagnosis not present

## 2022-02-01 DIAGNOSIS — I951 Orthostatic hypotension: Secondary | ICD-10-CM | POA: Diagnosis not present

## 2022-02-01 DIAGNOSIS — K5730 Diverticulosis of large intestine without perforation or abscess without bleeding: Secondary | ICD-10-CM | POA: Diagnosis not present

## 2022-02-01 DIAGNOSIS — I129 Hypertensive chronic kidney disease with stage 1 through stage 4 chronic kidney disease, or unspecified chronic kidney disease: Secondary | ICD-10-CM | POA: Diagnosis not present

## 2022-02-01 DIAGNOSIS — N1831 Chronic kidney disease, stage 3a: Secondary | ICD-10-CM | POA: Diagnosis not present

## 2022-02-01 DIAGNOSIS — D631 Anemia in chronic kidney disease: Secondary | ICD-10-CM | POA: Diagnosis not present

## 2022-02-01 DIAGNOSIS — E1122 Type 2 diabetes mellitus with diabetic chronic kidney disease: Secondary | ICD-10-CM | POA: Diagnosis not present

## 2022-02-03 DIAGNOSIS — I129 Hypertensive chronic kidney disease with stage 1 through stage 4 chronic kidney disease, or unspecified chronic kidney disease: Secondary | ICD-10-CM | POA: Diagnosis not present

## 2022-02-03 DIAGNOSIS — I951 Orthostatic hypotension: Secondary | ICD-10-CM | POA: Diagnosis not present

## 2022-02-03 DIAGNOSIS — E1122 Type 2 diabetes mellitus with diabetic chronic kidney disease: Secondary | ICD-10-CM | POA: Diagnosis not present

## 2022-02-03 DIAGNOSIS — D509 Iron deficiency anemia, unspecified: Secondary | ICD-10-CM | POA: Diagnosis not present

## 2022-02-03 DIAGNOSIS — N1831 Chronic kidney disease, stage 3a: Secondary | ICD-10-CM | POA: Diagnosis not present

## 2022-02-03 DIAGNOSIS — D631 Anemia in chronic kidney disease: Secondary | ICD-10-CM | POA: Diagnosis not present

## 2022-02-03 DIAGNOSIS — K5730 Diverticulosis of large intestine without perforation or abscess without bleeding: Secondary | ICD-10-CM | POA: Diagnosis not present

## 2022-02-08 DIAGNOSIS — D631 Anemia in chronic kidney disease: Secondary | ICD-10-CM | POA: Diagnosis not present

## 2022-02-08 DIAGNOSIS — K5730 Diverticulosis of large intestine without perforation or abscess without bleeding: Secondary | ICD-10-CM | POA: Diagnosis not present

## 2022-02-08 DIAGNOSIS — I129 Hypertensive chronic kidney disease with stage 1 through stage 4 chronic kidney disease, or unspecified chronic kidney disease: Secondary | ICD-10-CM | POA: Diagnosis not present

## 2022-02-08 DIAGNOSIS — E1122 Type 2 diabetes mellitus with diabetic chronic kidney disease: Secondary | ICD-10-CM | POA: Diagnosis not present

## 2022-02-08 DIAGNOSIS — I951 Orthostatic hypotension: Secondary | ICD-10-CM | POA: Diagnosis not present

## 2022-02-08 DIAGNOSIS — N1831 Chronic kidney disease, stage 3a: Secondary | ICD-10-CM | POA: Diagnosis not present

## 2022-02-09 DIAGNOSIS — I129 Hypertensive chronic kidney disease with stage 1 through stage 4 chronic kidney disease, or unspecified chronic kidney disease: Secondary | ICD-10-CM | POA: Diagnosis not present

## 2022-02-09 DIAGNOSIS — E1122 Type 2 diabetes mellitus with diabetic chronic kidney disease: Secondary | ICD-10-CM | POA: Diagnosis not present

## 2022-02-09 DIAGNOSIS — N1831 Chronic kidney disease, stage 3a: Secondary | ICD-10-CM | POA: Diagnosis not present

## 2022-02-09 DIAGNOSIS — K5730 Diverticulosis of large intestine without perforation or abscess without bleeding: Secondary | ICD-10-CM | POA: Diagnosis not present

## 2022-02-09 DIAGNOSIS — I951 Orthostatic hypotension: Secondary | ICD-10-CM | POA: Diagnosis not present

## 2022-02-09 DIAGNOSIS — D631 Anemia in chronic kidney disease: Secondary | ICD-10-CM | POA: Diagnosis not present

## 2022-02-12 DIAGNOSIS — E1140 Type 2 diabetes mellitus with diabetic neuropathy, unspecified: Secondary | ICD-10-CM | POA: Diagnosis not present

## 2022-02-12 DIAGNOSIS — I10 Essential (primary) hypertension: Secondary | ICD-10-CM | POA: Diagnosis not present

## 2022-02-12 DIAGNOSIS — N1831 Chronic kidney disease, stage 3a: Secondary | ICD-10-CM | POA: Diagnosis not present

## 2022-02-14 DIAGNOSIS — I951 Orthostatic hypotension: Secondary | ICD-10-CM | POA: Diagnosis not present

## 2022-02-14 DIAGNOSIS — I129 Hypertensive chronic kidney disease with stage 1 through stage 4 chronic kidney disease, or unspecified chronic kidney disease: Secondary | ICD-10-CM | POA: Diagnosis not present

## 2022-02-14 DIAGNOSIS — K5730 Diverticulosis of large intestine without perforation or abscess without bleeding: Secondary | ICD-10-CM | POA: Diagnosis not present

## 2022-02-14 DIAGNOSIS — N1831 Chronic kidney disease, stage 3a: Secondary | ICD-10-CM | POA: Diagnosis not present

## 2022-02-14 DIAGNOSIS — E1122 Type 2 diabetes mellitus with diabetic chronic kidney disease: Secondary | ICD-10-CM | POA: Diagnosis not present

## 2022-02-14 DIAGNOSIS — D631 Anemia in chronic kidney disease: Secondary | ICD-10-CM | POA: Diagnosis not present

## 2022-02-16 ENCOUNTER — Other Ambulatory Visit: Payer: Self-pay

## 2022-02-16 DIAGNOSIS — E1122 Type 2 diabetes mellitus with diabetic chronic kidney disease: Secondary | ICD-10-CM | POA: Diagnosis not present

## 2022-02-16 DIAGNOSIS — N1831 Chronic kidney disease, stage 3a: Secondary | ICD-10-CM | POA: Diagnosis not present

## 2022-02-16 DIAGNOSIS — I951 Orthostatic hypotension: Secondary | ICD-10-CM | POA: Diagnosis not present

## 2022-02-16 DIAGNOSIS — D631 Anemia in chronic kidney disease: Secondary | ICD-10-CM | POA: Diagnosis not present

## 2022-02-16 DIAGNOSIS — I129 Hypertensive chronic kidney disease with stage 1 through stage 4 chronic kidney disease, or unspecified chronic kidney disease: Secondary | ICD-10-CM | POA: Diagnosis not present

## 2022-02-16 DIAGNOSIS — K5730 Diverticulosis of large intestine without perforation or abscess without bleeding: Secondary | ICD-10-CM | POA: Diagnosis not present

## 2022-02-17 ENCOUNTER — Ambulatory Visit: Payer: Self-pay

## 2022-02-17 ENCOUNTER — Telehealth: Payer: Self-pay

## 2022-02-17 NOTE — Patient Outreach (Signed)
  Care Coordination   Follow Up Visit Note   02/17/2022 Name: David Steele MRN: [id number] DOB: 11-08-1925  David Steele is a 88 y.o. year old male who sees Prochnau, Chrys Racer, MD for primary care. I spoke with  David Steele by phone today. With patients permission I spoke with David Steele.  What matters to the patients health and wellness today?  Son reports that patient is having good day and bad days with his right knee. Reports yesterday was a bad day and so far this morning today is better.  Inquired if patient had gone to see PCP or ortho and son reports that he has not made an appointment yet.      Goals Addressed               This Visit's Progress     Pain in knee (pt-stated)        Care Coordination Interventions: Reviewed current condition with son.  Reviewed if patient or son made an appointment for an injection and son states no. Encouraged son to call MD and make an appointment. Provided son with name and telephone number of [facility] [phone]. Encouraged son to call today and make an appointment. Advise son if he has problems to call me back I would assist. Son inquired about knee sleeve. Reviewed with son he can purchase these at most drug stores.  Follow up scheduled.          SDOH assessments and interventions completed:  No     Care Coordination Interventions Activated:  Yes  Care Coordination Interventions:  Yes, provided   Follow up plan: Follow up call scheduled for 03/02/2022    Encounter Outcome:  Pt. Visit Completed   David Rand, RN, BSN, CEN [place] Coordinator [phone]

## 2022-02-17 NOTE — Patient Outreach (Signed)
  Care Coordination   Follow Up Visit Note   02/17/2022 Name: David Steele MRN: [id number] DOB: February 16, 1926  David Steele is a 88 y.o. year old male who sees Prochnau, Chrys Racer, MD for primary care. I spoke with  David Steele by phone today. Incoming call from [place]  What matters to the patients health and wellness today?  Care coordination for ortho appointment    Goals Addressed               This Visit's Progress     Pain in knee (pt-stated)        Care Coordination Interventions: Son called back and states that patient has an appointment with ortho on 02/21/2022 at 79am. Son will provide transportation. Encouraged son to review with MD all concerns.          SDOH assessments and interventions completed:  No     Care Coordination Interventions Activated:  Yes  Care Coordination Interventions:  Yes, provided   Follow up plan: Follow up call scheduled for 03/02/2022    Encounter Outcome:  Pt. Visit Completed   Tomasa Rand, RN, BSN, CEN [place] Coordinator [phone]

## 2022-02-21 DIAGNOSIS — Z8701 Personal history of pneumonia (recurrent): Secondary | ICD-10-CM | POA: Diagnosis not present

## 2022-02-21 DIAGNOSIS — E1122 Type 2 diabetes mellitus with diabetic chronic kidney disease: Secondary | ICD-10-CM | POA: Diagnosis not present

## 2022-02-21 DIAGNOSIS — N1831 Chronic kidney disease, stage 3a: Secondary | ICD-10-CM | POA: Diagnosis not present

## 2022-02-21 DIAGNOSIS — D631 Anemia in chronic kidney disease: Secondary | ICD-10-CM | POA: Diagnosis not present

## 2022-02-21 DIAGNOSIS — Z8546 Personal history of malignant neoplasm of prostate: Secondary | ICD-10-CM | POA: Diagnosis not present

## 2022-02-21 DIAGNOSIS — Z7984 Long term (current) use of oral hypoglycemic drugs: Secondary | ICD-10-CM | POA: Diagnosis not present

## 2022-02-21 DIAGNOSIS — K5730 Diverticulosis of large intestine without perforation or abscess without bleeding: Secondary | ICD-10-CM | POA: Diagnosis not present

## 2022-02-21 DIAGNOSIS — R338 Other retention of urine: Secondary | ICD-10-CM | POA: Diagnosis not present

## 2022-02-21 DIAGNOSIS — M1711 Unilateral primary osteoarthritis, right knee: Secondary | ICD-10-CM | POA: Diagnosis not present

## 2022-02-21 DIAGNOSIS — Z794 Long term (current) use of insulin: Secondary | ICD-10-CM | POA: Diagnosis not present

## 2022-02-21 DIAGNOSIS — E785 Hyperlipidemia, unspecified: Secondary | ICD-10-CM | POA: Diagnosis not present

## 2022-02-21 DIAGNOSIS — I480 Paroxysmal atrial fibrillation: Secondary | ICD-10-CM | POA: Diagnosis not present

## 2022-02-21 DIAGNOSIS — H905 Unspecified sensorineural hearing loss: Secondary | ICD-10-CM | POA: Diagnosis not present

## 2022-02-21 DIAGNOSIS — I951 Orthostatic hypotension: Secondary | ICD-10-CM | POA: Diagnosis not present

## 2022-02-21 DIAGNOSIS — F329 Major depressive disorder, single episode, unspecified: Secondary | ICD-10-CM | POA: Diagnosis not present

## 2022-02-21 DIAGNOSIS — Z8616 Personal history of COVID-19: Secondary | ICD-10-CM | POA: Diagnosis not present

## 2022-02-21 DIAGNOSIS — I129 Hypertensive chronic kidney disease with stage 1 through stage 4 chronic kidney disease, or unspecified chronic kidney disease: Secondary | ICD-10-CM | POA: Diagnosis not present

## 2022-02-21 DIAGNOSIS — N401 Enlarged prostate with lower urinary tract symptoms: Secondary | ICD-10-CM | POA: Diagnosis not present

## 2022-02-23 DIAGNOSIS — I951 Orthostatic hypotension: Secondary | ICD-10-CM | POA: Diagnosis not present

## 2022-02-23 DIAGNOSIS — D631 Anemia in chronic kidney disease: Secondary | ICD-10-CM | POA: Diagnosis not present

## 2022-02-23 DIAGNOSIS — N1831 Chronic kidney disease, stage 3a: Secondary | ICD-10-CM | POA: Diagnosis not present

## 2022-02-23 DIAGNOSIS — I129 Hypertensive chronic kidney disease with stage 1 through stage 4 chronic kidney disease, or unspecified chronic kidney disease: Secondary | ICD-10-CM | POA: Diagnosis not present

## 2022-02-23 DIAGNOSIS — K5730 Diverticulosis of large intestine without perforation or abscess without bleeding: Secondary | ICD-10-CM | POA: Diagnosis not present

## 2022-02-23 DIAGNOSIS — E1122 Type 2 diabetes mellitus with diabetic chronic kidney disease: Secondary | ICD-10-CM | POA: Diagnosis not present

## 2022-03-02 ENCOUNTER — Ambulatory Visit: Payer: Self-pay

## 2022-03-02 NOTE — Patient Outreach (Signed)
  Care Coordination   Follow Up Visit Note   03/02/2022 Name: David Steele MRN: [id number] DOB: 1925-12-05  David Steele is a 88 y.o. year old male who sees Prochnau, Chrys Racer, MD for primary care. I spoke with  David Steele by phone today. Spoke with son David Steele.  What matters to the patients health and wellness today?  Per son.... patient is much better after his knee injection. Son reports that he is up and about. No recent falls, Son reports no complaint of pain since injection.    Goals Addressed               This Visit's Progress     COMPLETED: Pain in knee (pt-stated)        Care Coordination Interventions: Goal met. No pain.           SDOH assessments and interventions completed:  No     Care Coordination Interventions Activated:  Yes  Care Coordination Interventions:  Yes, provided   Follow up plan: No further intervention required.   Encounter Outcome:  Pt. Visit Completed   Tomasa Rand, RN, BSN, CEN [place] Coordinator [phone]

## 2022-03-03 DIAGNOSIS — D631 Anemia in chronic kidney disease: Secondary | ICD-10-CM | POA: Diagnosis not present

## 2022-03-03 DIAGNOSIS — K5730 Diverticulosis of large intestine without perforation or abscess without bleeding: Secondary | ICD-10-CM | POA: Diagnosis not present

## 2022-03-03 DIAGNOSIS — I129 Hypertensive chronic kidney disease with stage 1 through stage 4 chronic kidney disease, or unspecified chronic kidney disease: Secondary | ICD-10-CM | POA: Diagnosis not present

## 2022-03-03 DIAGNOSIS — N1831 Chronic kidney disease, stage 3a: Secondary | ICD-10-CM | POA: Diagnosis not present

## 2022-03-03 DIAGNOSIS — I951 Orthostatic hypotension: Secondary | ICD-10-CM | POA: Diagnosis not present

## 2022-03-03 DIAGNOSIS — E1122 Type 2 diabetes mellitus with diabetic chronic kidney disease: Secondary | ICD-10-CM | POA: Diagnosis not present

## 2022-03-07 ENCOUNTER — Encounter (HOSPITAL_COMMUNITY): Payer: Self-pay

## 2022-03-07 DIAGNOSIS — M869 Osteomyelitis, unspecified: Secondary | ICD-10-CM | POA: Diagnosis not present

## 2022-03-07 DIAGNOSIS — I129 Hypertensive chronic kidney disease with stage 1 through stage 4 chronic kidney disease, or unspecified chronic kidney disease: Secondary | ICD-10-CM | POA: Diagnosis not present

## 2022-03-07 DIAGNOSIS — E7800 Pure hypercholesterolemia, unspecified: Secondary | ICD-10-CM | POA: Diagnosis not present

## 2022-03-07 DIAGNOSIS — M7989 Other specified soft tissue disorders: Secondary | ICD-10-CM | POA: Diagnosis not present

## 2022-03-07 DIAGNOSIS — N179 Acute kidney failure, unspecified: Secondary | ICD-10-CM | POA: Diagnosis not present

## 2022-03-07 DIAGNOSIS — E1165 Type 2 diabetes mellitus with hyperglycemia: Secondary | ICD-10-CM | POA: Diagnosis not present

## 2022-03-07 DIAGNOSIS — I891 Lymphangitis: Secondary | ICD-10-CM | POA: Diagnosis not present

## 2022-03-07 DIAGNOSIS — L03129 Acute lymphangitis of unspecified part of limb: Secondary | ICD-10-CM | POA: Diagnosis not present

## 2022-03-07 DIAGNOSIS — J439 Emphysema, unspecified: Secondary | ICD-10-CM | POA: Diagnosis not present

## 2022-03-07 DIAGNOSIS — L03031 Cellulitis of right toe: Secondary | ICD-10-CM | POA: Diagnosis not present

## 2022-03-07 DIAGNOSIS — E1122 Type 2 diabetes mellitus with diabetic chronic kidney disease: Secondary | ICD-10-CM | POA: Diagnosis not present

## 2022-03-07 DIAGNOSIS — I491 Atrial premature depolarization: Secondary | ICD-10-CM | POA: Diagnosis not present

## 2022-03-07 DIAGNOSIS — E1169 Type 2 diabetes mellitus with other specified complication: Secondary | ICD-10-CM | POA: Diagnosis not present

## 2022-03-08 DIAGNOSIS — L089 Local infection of the skin and subcutaneous tissue, unspecified: Secondary | ICD-10-CM | POA: Diagnosis not present

## 2022-03-08 DIAGNOSIS — E11628 Type 2 diabetes mellitus with other skin complications: Secondary | ICD-10-CM | POA: Diagnosis not present

## 2022-03-08 DIAGNOSIS — R531 Weakness: Secondary | ICD-10-CM | POA: Diagnosis not present

## 2022-03-08 DIAGNOSIS — L0390 Cellulitis, unspecified: Secondary | ICD-10-CM | POA: Diagnosis not present

## 2022-03-09 DIAGNOSIS — Z4889 Encounter for other specified surgical aftercare: Secondary | ICD-10-CM | POA: Diagnosis not present

## 2022-03-09 DIAGNOSIS — M19071 Primary osteoarthritis, right ankle and foot: Secondary | ICD-10-CM | POA: Diagnosis not present

## 2022-03-09 DIAGNOSIS — E785 Hyperlipidemia, unspecified: Secondary | ICD-10-CM | POA: Diagnosis not present

## 2022-03-09 DIAGNOSIS — R42 Dizziness and giddiness: Secondary | ICD-10-CM | POA: Diagnosis not present

## 2022-03-09 DIAGNOSIS — H42 Glaucoma in diseases classified elsewhere: Secondary | ICD-10-CM | POA: Diagnosis not present

## 2022-03-09 DIAGNOSIS — B9562 Methicillin resistant Staphylococcus aureus infection as the cause of diseases classified elsewhere: Secondary | ICD-10-CM | POA: Diagnosis not present

## 2022-03-09 DIAGNOSIS — K3189 Other diseases of stomach and duodenum: Secondary | ICD-10-CM | POA: Diagnosis not present

## 2022-03-09 DIAGNOSIS — I96 Gangrene, not elsewhere classified: Secondary | ICD-10-CM | POA: Diagnosis not present

## 2022-03-09 DIAGNOSIS — H409 Unspecified glaucoma: Secondary | ICD-10-CM | POA: Diagnosis not present

## 2022-03-09 DIAGNOSIS — M86171 Other acute osteomyelitis, right ankle and foot: Secondary | ICD-10-CM | POA: Diagnosis not present

## 2022-03-09 DIAGNOSIS — E1152 Type 2 diabetes mellitus with diabetic peripheral angiopathy with gangrene: Secondary | ICD-10-CM | POA: Diagnosis not present

## 2022-03-09 DIAGNOSIS — L97519 Non-pressure chronic ulcer of other part of right foot with unspecified severity: Secondary | ICD-10-CM | POA: Diagnosis not present

## 2022-03-09 DIAGNOSIS — L03115 Cellulitis of right lower limb: Secondary | ICD-10-CM | POA: Diagnosis not present

## 2022-03-09 DIAGNOSIS — Z8553 Personal history of malignant neoplasm of renal pelvis: Secondary | ICD-10-CM | POA: Diagnosis not present

## 2022-03-09 DIAGNOSIS — N323 Diverticulum of bladder: Secondary | ICD-10-CM | POA: Diagnosis not present

## 2022-03-09 DIAGNOSIS — E1100 Type 2 diabetes mellitus with hyperosmolarity without nonketotic hyperglycemic-hyperosmolar coma (NKHHC): Secondary | ICD-10-CM | POA: Diagnosis not present

## 2022-03-09 DIAGNOSIS — H903 Sensorineural hearing loss, bilateral: Secondary | ICD-10-CM | POA: Diagnosis not present

## 2022-03-09 DIAGNOSIS — E875 Hyperkalemia: Secondary | ICD-10-CM | POA: Diagnosis not present

## 2022-03-09 DIAGNOSIS — I359 Nonrheumatic aortic valve disorder, unspecified: Secondary | ICD-10-CM | POA: Diagnosis not present

## 2022-03-09 DIAGNOSIS — E1165 Type 2 diabetes mellitus with hyperglycemia: Secondary | ICD-10-CM | POA: Diagnosis not present

## 2022-03-09 DIAGNOSIS — N179 Acute kidney failure, unspecified: Secondary | ICD-10-CM | POA: Diagnosis not present

## 2022-03-09 DIAGNOSIS — M869 Osteomyelitis, unspecified: Secondary | ICD-10-CM | POA: Diagnosis not present

## 2022-03-09 DIAGNOSIS — E11621 Type 2 diabetes mellitus with foot ulcer: Secondary | ICD-10-CM | POA: Diagnosis not present

## 2022-03-09 DIAGNOSIS — Z905 Acquired absence of kidney: Secondary | ICD-10-CM | POA: Diagnosis not present

## 2022-03-09 DIAGNOSIS — Z87891 Personal history of nicotine dependence: Secondary | ICD-10-CM | POA: Diagnosis not present

## 2022-03-09 DIAGNOSIS — Z794 Long term (current) use of insulin: Secondary | ICD-10-CM | POA: Diagnosis not present

## 2022-03-09 DIAGNOSIS — M90571 Osteonecrosis in diseases classified elsewhere, right ankle and foot: Secondary | ICD-10-CM | POA: Diagnosis not present

## 2022-03-09 DIAGNOSIS — E11628 Type 2 diabetes mellitus with other skin complications: Secondary | ICD-10-CM | POA: Diagnosis not present

## 2022-03-09 DIAGNOSIS — L089 Local infection of the skin and subcutaneous tissue, unspecified: Secondary | ICD-10-CM | POA: Diagnosis not present

## 2022-03-09 DIAGNOSIS — R0689 Other abnormalities of breathing: Secondary | ICD-10-CM | POA: Diagnosis not present

## 2022-03-09 DIAGNOSIS — Z89431 Acquired absence of right foot: Secondary | ICD-10-CM | POA: Diagnosis not present

## 2022-03-09 DIAGNOSIS — R0602 Shortness of breath: Secondary | ICD-10-CM | POA: Diagnosis not present

## 2022-03-09 DIAGNOSIS — L97428 Non-pressure chronic ulcer of left heel and midfoot with other specified severity: Secondary | ICD-10-CM | POA: Diagnosis not present

## 2022-03-09 DIAGNOSIS — E1139 Type 2 diabetes mellitus with other diabetic ophthalmic complication: Secondary | ICD-10-CM | POA: Diagnosis not present

## 2022-03-09 DIAGNOSIS — I10 Essential (primary) hypertension: Secondary | ICD-10-CM | POA: Diagnosis not present

## 2022-03-09 DIAGNOSIS — R918 Other nonspecific abnormal finding of lung field: Secondary | ICD-10-CM | POA: Diagnosis not present

## 2022-03-09 DIAGNOSIS — B954 Other streptococcus as the cause of diseases classified elsewhere: Secondary | ICD-10-CM | POA: Diagnosis not present

## 2022-03-09 DIAGNOSIS — N210 Calculus in bladder: Secondary | ICD-10-CM | POA: Diagnosis not present

## 2022-03-09 DIAGNOSIS — D509 Iron deficiency anemia, unspecified: Secondary | ICD-10-CM | POA: Diagnosis not present

## 2022-03-09 DIAGNOSIS — L03031 Cellulitis of right toe: Secondary | ICD-10-CM | POA: Diagnosis not present

## 2022-03-09 DIAGNOSIS — N3289 Other specified disorders of bladder: Secondary | ICD-10-CM | POA: Diagnosis not present

## 2022-03-09 DIAGNOSIS — Z7901 Long term (current) use of anticoagulants: Secondary | ICD-10-CM | POA: Diagnosis not present

## 2022-03-15 DIAGNOSIS — E1122 Type 2 diabetes mellitus with diabetic chronic kidney disease: Secondary | ICD-10-CM | POA: Diagnosis not present

## 2022-03-15 DIAGNOSIS — I951 Orthostatic hypotension: Secondary | ICD-10-CM | POA: Diagnosis not present

## 2022-03-15 DIAGNOSIS — K5730 Diverticulosis of large intestine without perforation or abscess without bleeding: Secondary | ICD-10-CM | POA: Diagnosis not present

## 2022-03-15 DIAGNOSIS — D631 Anemia in chronic kidney disease: Secondary | ICD-10-CM | POA: Diagnosis not present

## 2022-03-15 DIAGNOSIS — N1831 Chronic kidney disease, stage 3a: Secondary | ICD-10-CM | POA: Diagnosis not present

## 2022-03-15 DIAGNOSIS — I129 Hypertensive chronic kidney disease with stage 1 through stage 4 chronic kidney disease, or unspecified chronic kidney disease: Secondary | ICD-10-CM | POA: Diagnosis not present

## 2022-03-16 ENCOUNTER — Ambulatory Visit: Payer: Medicare Other | Admitting: Cardiology

## 2022-03-16 DIAGNOSIS — I951 Orthostatic hypotension: Secondary | ICD-10-CM | POA: Diagnosis not present

## 2022-03-16 DIAGNOSIS — I129 Hypertensive chronic kidney disease with stage 1 through stage 4 chronic kidney disease, or unspecified chronic kidney disease: Secondary | ICD-10-CM | POA: Diagnosis not present

## 2022-03-16 DIAGNOSIS — K5730 Diverticulosis of large intestine without perforation or abscess without bleeding: Secondary | ICD-10-CM | POA: Diagnosis not present

## 2022-03-16 DIAGNOSIS — D631 Anemia in chronic kidney disease: Secondary | ICD-10-CM | POA: Diagnosis not present

## 2022-03-16 DIAGNOSIS — N1831 Chronic kidney disease, stage 3a: Secondary | ICD-10-CM | POA: Diagnosis not present

## 2022-03-16 DIAGNOSIS — E1122 Type 2 diabetes mellitus with diabetic chronic kidney disease: Secondary | ICD-10-CM | POA: Diagnosis not present

## 2022-03-21 DIAGNOSIS — E1122 Type 2 diabetes mellitus with diabetic chronic kidney disease: Secondary | ICD-10-CM | POA: Diagnosis not present

## 2022-03-21 DIAGNOSIS — K5730 Diverticulosis of large intestine without perforation or abscess without bleeding: Secondary | ICD-10-CM | POA: Diagnosis not present

## 2022-03-21 DIAGNOSIS — I951 Orthostatic hypotension: Secondary | ICD-10-CM | POA: Diagnosis not present

## 2022-03-21 DIAGNOSIS — I129 Hypertensive chronic kidney disease with stage 1 through stage 4 chronic kidney disease, or unspecified chronic kidney disease: Secondary | ICD-10-CM | POA: Diagnosis not present

## 2022-03-21 DIAGNOSIS — D631 Anemia in chronic kidney disease: Secondary | ICD-10-CM | POA: Diagnosis not present

## 2022-03-21 DIAGNOSIS — N1831 Chronic kidney disease, stage 3a: Secondary | ICD-10-CM | POA: Diagnosis not present

## 2022-03-22 DIAGNOSIS — I951 Orthostatic hypotension: Secondary | ICD-10-CM | POA: Diagnosis not present

## 2022-03-22 DIAGNOSIS — K5730 Diverticulosis of large intestine without perforation or abscess without bleeding: Secondary | ICD-10-CM | POA: Diagnosis not present

## 2022-03-22 DIAGNOSIS — N1831 Chronic kidney disease, stage 3a: Secondary | ICD-10-CM | POA: Diagnosis not present

## 2022-03-22 DIAGNOSIS — I129 Hypertensive chronic kidney disease with stage 1 through stage 4 chronic kidney disease, or unspecified chronic kidney disease: Secondary | ICD-10-CM | POA: Diagnosis not present

## 2022-03-22 DIAGNOSIS — E1122 Type 2 diabetes mellitus with diabetic chronic kidney disease: Secondary | ICD-10-CM | POA: Diagnosis not present

## 2022-03-22 DIAGNOSIS — D631 Anemia in chronic kidney disease: Secondary | ICD-10-CM | POA: Diagnosis not present

## 2022-03-23 DIAGNOSIS — N401 Enlarged prostate with lower urinary tract symptoms: Secondary | ICD-10-CM | POA: Diagnosis not present

## 2022-03-23 DIAGNOSIS — K5730 Diverticulosis of large intestine without perforation or abscess without bleeding: Secondary | ICD-10-CM | POA: Diagnosis not present

## 2022-03-23 DIAGNOSIS — Z8701 Personal history of pneumonia (recurrent): Secondary | ICD-10-CM | POA: Diagnosis not present

## 2022-03-23 DIAGNOSIS — R338 Other retention of urine: Secondary | ICD-10-CM | POA: Diagnosis not present

## 2022-03-23 DIAGNOSIS — N1831 Chronic kidney disease, stage 3a: Secondary | ICD-10-CM | POA: Diagnosis not present

## 2022-03-23 DIAGNOSIS — D631 Anemia in chronic kidney disease: Secondary | ICD-10-CM | POA: Diagnosis not present

## 2022-03-23 DIAGNOSIS — E1122 Type 2 diabetes mellitus with diabetic chronic kidney disease: Secondary | ICD-10-CM | POA: Diagnosis not present

## 2022-03-23 DIAGNOSIS — F329 Major depressive disorder, single episode, unspecified: Secondary | ICD-10-CM | POA: Diagnosis not present

## 2022-03-23 DIAGNOSIS — I951 Orthostatic hypotension: Secondary | ICD-10-CM | POA: Diagnosis not present

## 2022-03-23 DIAGNOSIS — I129 Hypertensive chronic kidney disease with stage 1 through stage 4 chronic kidney disease, or unspecified chronic kidney disease: Secondary | ICD-10-CM | POA: Diagnosis not present

## 2022-03-23 DIAGNOSIS — Z7984 Long term (current) use of oral hypoglycemic drugs: Secondary | ICD-10-CM | POA: Diagnosis not present

## 2022-03-23 DIAGNOSIS — Z8546 Personal history of malignant neoplasm of prostate: Secondary | ICD-10-CM | POA: Diagnosis not present

## 2022-03-23 DIAGNOSIS — H905 Unspecified sensorineural hearing loss: Secondary | ICD-10-CM | POA: Diagnosis not present

## 2022-03-23 DIAGNOSIS — Z8616 Personal history of COVID-19: Secondary | ICD-10-CM | POA: Diagnosis not present

## 2022-03-23 DIAGNOSIS — E785 Hyperlipidemia, unspecified: Secondary | ICD-10-CM | POA: Diagnosis not present

## 2022-03-23 DIAGNOSIS — I480 Paroxysmal atrial fibrillation: Secondary | ICD-10-CM | POA: Diagnosis not present

## 2022-03-23 DIAGNOSIS — Z4781 Encounter for orthopedic aftercare following surgical amputation: Secondary | ICD-10-CM | POA: Diagnosis not present

## 2022-03-23 DIAGNOSIS — Z794 Long term (current) use of insulin: Secondary | ICD-10-CM | POA: Diagnosis not present

## 2022-03-25 DIAGNOSIS — I129 Hypertensive chronic kidney disease with stage 1 through stage 4 chronic kidney disease, or unspecified chronic kidney disease: Secondary | ICD-10-CM | POA: Diagnosis not present

## 2022-03-25 DIAGNOSIS — E1122 Type 2 diabetes mellitus with diabetic chronic kidney disease: Secondary | ICD-10-CM | POA: Diagnosis not present

## 2022-03-25 DIAGNOSIS — I951 Orthostatic hypotension: Secondary | ICD-10-CM | POA: Diagnosis not present

## 2022-03-25 DIAGNOSIS — K5730 Diverticulosis of large intestine without perforation or abscess without bleeding: Secondary | ICD-10-CM | POA: Diagnosis not present

## 2022-03-25 DIAGNOSIS — Z4781 Encounter for orthopedic aftercare following surgical amputation: Secondary | ICD-10-CM | POA: Diagnosis not present

## 2022-03-25 DIAGNOSIS — N1831 Chronic kidney disease, stage 3a: Secondary | ICD-10-CM | POA: Diagnosis not present

## 2022-03-28 DIAGNOSIS — K5730 Diverticulosis of large intestine without perforation or abscess without bleeding: Secondary | ICD-10-CM | POA: Diagnosis not present

## 2022-03-28 DIAGNOSIS — N1831 Chronic kidney disease, stage 3a: Secondary | ICD-10-CM | POA: Diagnosis not present

## 2022-03-28 DIAGNOSIS — E1122 Type 2 diabetes mellitus with diabetic chronic kidney disease: Secondary | ICD-10-CM | POA: Diagnosis not present

## 2022-03-28 DIAGNOSIS — I951 Orthostatic hypotension: Secondary | ICD-10-CM | POA: Diagnosis not present

## 2022-03-28 DIAGNOSIS — Z4781 Encounter for orthopedic aftercare following surgical amputation: Secondary | ICD-10-CM | POA: Diagnosis not present

## 2022-03-28 DIAGNOSIS — I129 Hypertensive chronic kidney disease with stage 1 through stage 4 chronic kidney disease, or unspecified chronic kidney disease: Secondary | ICD-10-CM | POA: Diagnosis not present

## 2022-03-29 DIAGNOSIS — I951 Orthostatic hypotension: Secondary | ICD-10-CM | POA: Diagnosis not present

## 2022-03-29 DIAGNOSIS — K5730 Diverticulosis of large intestine without perforation or abscess without bleeding: Secondary | ICD-10-CM | POA: Diagnosis not present

## 2022-03-29 DIAGNOSIS — Z4781 Encounter for orthopedic aftercare following surgical amputation: Secondary | ICD-10-CM | POA: Diagnosis not present

## 2022-03-29 DIAGNOSIS — I129 Hypertensive chronic kidney disease with stage 1 through stage 4 chronic kidney disease, or unspecified chronic kidney disease: Secondary | ICD-10-CM | POA: Diagnosis not present

## 2022-03-29 DIAGNOSIS — E1122 Type 2 diabetes mellitus with diabetic chronic kidney disease: Secondary | ICD-10-CM | POA: Diagnosis not present

## 2022-03-29 DIAGNOSIS — N1831 Chronic kidney disease, stage 3a: Secondary | ICD-10-CM | POA: Diagnosis not present

## 2022-03-30 DIAGNOSIS — Z87891 Personal history of nicotine dependence: Secondary | ICD-10-CM | POA: Diagnosis not present

## 2022-03-30 DIAGNOSIS — I10 Essential (primary) hypertension: Secondary | ICD-10-CM | POA: Diagnosis not present

## 2022-03-30 DIAGNOSIS — E119 Type 2 diabetes mellitus without complications: Secondary | ICD-10-CM | POA: Diagnosis not present

## 2022-03-30 DIAGNOSIS — L97522 Non-pressure chronic ulcer of other part of left foot with fat layer exposed: Secondary | ICD-10-CM | POA: Diagnosis not present

## 2022-03-30 DIAGNOSIS — M869 Osteomyelitis, unspecified: Secondary | ICD-10-CM | POA: Diagnosis not present

## 2022-03-30 DIAGNOSIS — E11628 Type 2 diabetes mellitus with other skin complications: Secondary | ICD-10-CM | POA: Diagnosis not present

## 2022-03-30 DIAGNOSIS — Z79899 Other long term (current) drug therapy: Secondary | ICD-10-CM | POA: Diagnosis not present

## 2022-03-30 DIAGNOSIS — T8132XA Disruption of internal operation (surgical) wound, not elsewhere classified, initial encounter: Secondary | ICD-10-CM | POA: Diagnosis not present

## 2022-03-30 DIAGNOSIS — T8781 Dehiscence of amputation stump: Secondary | ICD-10-CM | POA: Diagnosis not present

## 2022-03-30 DIAGNOSIS — L089 Local infection of the skin and subcutaneous tissue, unspecified: Secondary | ICD-10-CM | POA: Diagnosis not present

## 2022-03-30 DIAGNOSIS — Z7901 Long term (current) use of anticoagulants: Secondary | ICD-10-CM | POA: Diagnosis not present

## 2022-03-30 DIAGNOSIS — E785 Hyperlipidemia, unspecified: Secondary | ICD-10-CM | POA: Diagnosis not present

## 2022-04-01 DIAGNOSIS — Z4781 Encounter for orthopedic aftercare following surgical amputation: Secondary | ICD-10-CM | POA: Diagnosis not present

## 2022-04-01 DIAGNOSIS — K5730 Diverticulosis of large intestine without perforation or abscess without bleeding: Secondary | ICD-10-CM | POA: Diagnosis not present

## 2022-04-01 DIAGNOSIS — N1831 Chronic kidney disease, stage 3a: Secondary | ICD-10-CM | POA: Diagnosis not present

## 2022-04-01 DIAGNOSIS — E1122 Type 2 diabetes mellitus with diabetic chronic kidney disease: Secondary | ICD-10-CM | POA: Diagnosis not present

## 2022-04-01 DIAGNOSIS — I951 Orthostatic hypotension: Secondary | ICD-10-CM | POA: Diagnosis not present

## 2022-04-01 DIAGNOSIS — I129 Hypertensive chronic kidney disease with stage 1 through stage 4 chronic kidney disease, or unspecified chronic kidney disease: Secondary | ICD-10-CM | POA: Diagnosis not present

## 2022-04-04 DIAGNOSIS — I951 Orthostatic hypotension: Secondary | ICD-10-CM | POA: Diagnosis not present

## 2022-04-04 DIAGNOSIS — K5730 Diverticulosis of large intestine without perforation or abscess without bleeding: Secondary | ICD-10-CM | POA: Diagnosis not present

## 2022-04-04 DIAGNOSIS — Z4781 Encounter for orthopedic aftercare following surgical amputation: Secondary | ICD-10-CM | POA: Diagnosis not present

## 2022-04-04 DIAGNOSIS — E1122 Type 2 diabetes mellitus with diabetic chronic kidney disease: Secondary | ICD-10-CM | POA: Diagnosis not present

## 2022-04-04 DIAGNOSIS — I129 Hypertensive chronic kidney disease with stage 1 through stage 4 chronic kidney disease, or unspecified chronic kidney disease: Secondary | ICD-10-CM | POA: Diagnosis not present

## 2022-04-04 DIAGNOSIS — N1831 Chronic kidney disease, stage 3a: Secondary | ICD-10-CM | POA: Diagnosis not present

## 2022-04-05 DIAGNOSIS — I129 Hypertensive chronic kidney disease with stage 1 through stage 4 chronic kidney disease, or unspecified chronic kidney disease: Secondary | ICD-10-CM | POA: Diagnosis not present

## 2022-04-05 DIAGNOSIS — I951 Orthostatic hypotension: Secondary | ICD-10-CM | POA: Diagnosis not present

## 2022-04-05 DIAGNOSIS — D631 Anemia in chronic kidney disease: Secondary | ICD-10-CM | POA: Diagnosis not present

## 2022-04-05 DIAGNOSIS — E1122 Type 2 diabetes mellitus with diabetic chronic kidney disease: Secondary | ICD-10-CM | POA: Diagnosis not present

## 2022-04-05 DIAGNOSIS — K5730 Diverticulosis of large intestine without perforation or abscess without bleeding: Secondary | ICD-10-CM | POA: Diagnosis not present

## 2022-04-05 DIAGNOSIS — Z4781 Encounter for orthopedic aftercare following surgical amputation: Secondary | ICD-10-CM | POA: Diagnosis not present

## 2022-04-05 DIAGNOSIS — N1831 Chronic kidney disease, stage 3a: Secondary | ICD-10-CM | POA: Diagnosis not present

## 2022-04-08 DIAGNOSIS — I951 Orthostatic hypotension: Secondary | ICD-10-CM | POA: Diagnosis not present

## 2022-04-08 DIAGNOSIS — I129 Hypertensive chronic kidney disease with stage 1 through stage 4 chronic kidney disease, or unspecified chronic kidney disease: Secondary | ICD-10-CM | POA: Diagnosis not present

## 2022-04-08 DIAGNOSIS — E1122 Type 2 diabetes mellitus with diabetic chronic kidney disease: Secondary | ICD-10-CM | POA: Diagnosis not present

## 2022-04-08 DIAGNOSIS — N1831 Chronic kidney disease, stage 3a: Secondary | ICD-10-CM | POA: Diagnosis not present

## 2022-04-08 DIAGNOSIS — Z4781 Encounter for orthopedic aftercare following surgical amputation: Secondary | ICD-10-CM | POA: Diagnosis not present

## 2022-04-08 DIAGNOSIS — K5730 Diverticulosis of large intestine without perforation or abscess without bleeding: Secondary | ICD-10-CM | POA: Diagnosis not present

## 2022-04-11 DIAGNOSIS — I951 Orthostatic hypotension: Secondary | ICD-10-CM | POA: Diagnosis not present

## 2022-04-11 DIAGNOSIS — K5730 Diverticulosis of large intestine without perforation or abscess without bleeding: Secondary | ICD-10-CM | POA: Diagnosis not present

## 2022-04-11 DIAGNOSIS — Z4781 Encounter for orthopedic aftercare following surgical amputation: Secondary | ICD-10-CM | POA: Diagnosis not present

## 2022-04-11 DIAGNOSIS — N1831 Chronic kidney disease, stage 3a: Secondary | ICD-10-CM | POA: Diagnosis not present

## 2022-04-11 DIAGNOSIS — E1122 Type 2 diabetes mellitus with diabetic chronic kidney disease: Secondary | ICD-10-CM | POA: Diagnosis not present

## 2022-04-11 DIAGNOSIS — I129 Hypertensive chronic kidney disease with stage 1 through stage 4 chronic kidney disease, or unspecified chronic kidney disease: Secondary | ICD-10-CM | POA: Diagnosis not present

## 2022-04-12 DIAGNOSIS — I951 Orthostatic hypotension: Secondary | ICD-10-CM | POA: Diagnosis not present

## 2022-04-12 DIAGNOSIS — N1831 Chronic kidney disease, stage 3a: Secondary | ICD-10-CM | POA: Diagnosis not present

## 2022-04-12 DIAGNOSIS — I129 Hypertensive chronic kidney disease with stage 1 through stage 4 chronic kidney disease, or unspecified chronic kidney disease: Secondary | ICD-10-CM | POA: Diagnosis not present

## 2022-04-12 DIAGNOSIS — E1122 Type 2 diabetes mellitus with diabetic chronic kidney disease: Secondary | ICD-10-CM | POA: Diagnosis not present

## 2022-04-12 DIAGNOSIS — Z4781 Encounter for orthopedic aftercare following surgical amputation: Secondary | ICD-10-CM | POA: Diagnosis not present

## 2022-04-12 DIAGNOSIS — K5730 Diverticulosis of large intestine without perforation or abscess without bleeding: Secondary | ICD-10-CM | POA: Diagnosis not present

## 2022-04-13 DIAGNOSIS — I129 Hypertensive chronic kidney disease with stage 1 through stage 4 chronic kidney disease, or unspecified chronic kidney disease: Secondary | ICD-10-CM | POA: Diagnosis not present

## 2022-04-13 DIAGNOSIS — Z4781 Encounter for orthopedic aftercare following surgical amputation: Secondary | ICD-10-CM | POA: Diagnosis not present

## 2022-04-13 DIAGNOSIS — E1122 Type 2 diabetes mellitus with diabetic chronic kidney disease: Secondary | ICD-10-CM | POA: Diagnosis not present

## 2022-04-13 DIAGNOSIS — N1831 Chronic kidney disease, stage 3a: Secondary | ICD-10-CM | POA: Diagnosis not present

## 2022-04-13 DIAGNOSIS — I951 Orthostatic hypotension: Secondary | ICD-10-CM | POA: Diagnosis not present

## 2022-04-13 DIAGNOSIS — K5730 Diverticulosis of large intestine without perforation or abscess without bleeding: Secondary | ICD-10-CM | POA: Diagnosis not present

## 2022-04-15 DIAGNOSIS — Z4781 Encounter for orthopedic aftercare following surgical amputation: Secondary | ICD-10-CM | POA: Diagnosis not present

## 2022-04-15 DIAGNOSIS — E1122 Type 2 diabetes mellitus with diabetic chronic kidney disease: Secondary | ICD-10-CM | POA: Diagnosis not present

## 2022-04-15 DIAGNOSIS — K5730 Diverticulosis of large intestine without perforation or abscess without bleeding: Secondary | ICD-10-CM | POA: Diagnosis not present

## 2022-04-15 DIAGNOSIS — I129 Hypertensive chronic kidney disease with stage 1 through stage 4 chronic kidney disease, or unspecified chronic kidney disease: Secondary | ICD-10-CM | POA: Diagnosis not present

## 2022-04-15 DIAGNOSIS — I951 Orthostatic hypotension: Secondary | ICD-10-CM | POA: Diagnosis not present

## 2022-04-15 DIAGNOSIS — N1831 Chronic kidney disease, stage 3a: Secondary | ICD-10-CM | POA: Diagnosis not present

## 2022-04-18 DIAGNOSIS — N1831 Chronic kidney disease, stage 3a: Secondary | ICD-10-CM | POA: Diagnosis not present

## 2022-04-18 DIAGNOSIS — E1122 Type 2 diabetes mellitus with diabetic chronic kidney disease: Secondary | ICD-10-CM | POA: Diagnosis not present

## 2022-04-18 DIAGNOSIS — I951 Orthostatic hypotension: Secondary | ICD-10-CM | POA: Diagnosis not present

## 2022-04-18 DIAGNOSIS — K5730 Diverticulosis of large intestine without perforation or abscess without bleeding: Secondary | ICD-10-CM | POA: Diagnosis not present

## 2022-04-18 DIAGNOSIS — I129 Hypertensive chronic kidney disease with stage 1 through stage 4 chronic kidney disease, or unspecified chronic kidney disease: Secondary | ICD-10-CM | POA: Diagnosis not present

## 2022-04-18 DIAGNOSIS — Z4781 Encounter for orthopedic aftercare following surgical amputation: Secondary | ICD-10-CM | POA: Diagnosis not present

## 2022-04-20 DIAGNOSIS — E1122 Type 2 diabetes mellitus with diabetic chronic kidney disease: Secondary | ICD-10-CM | POA: Diagnosis not present

## 2022-04-20 DIAGNOSIS — I951 Orthostatic hypotension: Secondary | ICD-10-CM | POA: Diagnosis not present

## 2022-04-20 DIAGNOSIS — Z4781 Encounter for orthopedic aftercare following surgical amputation: Secondary | ICD-10-CM | POA: Diagnosis not present

## 2022-04-20 DIAGNOSIS — N1831 Chronic kidney disease, stage 3a: Secondary | ICD-10-CM | POA: Diagnosis not present

## 2022-04-20 DIAGNOSIS — I129 Hypertensive chronic kidney disease with stage 1 through stage 4 chronic kidney disease, or unspecified chronic kidney disease: Secondary | ICD-10-CM | POA: Diagnosis not present

## 2022-04-20 DIAGNOSIS — K5730 Diverticulosis of large intestine without perforation or abscess without bleeding: Secondary | ICD-10-CM | POA: Diagnosis not present

## 2022-04-22 DIAGNOSIS — I480 Paroxysmal atrial fibrillation: Secondary | ICD-10-CM | POA: Diagnosis not present

## 2022-04-22 DIAGNOSIS — N401 Enlarged prostate with lower urinary tract symptoms: Secondary | ICD-10-CM | POA: Diagnosis not present

## 2022-04-22 DIAGNOSIS — Z8701 Personal history of pneumonia (recurrent): Secondary | ICD-10-CM | POA: Diagnosis not present

## 2022-04-22 DIAGNOSIS — F329 Major depressive disorder, single episode, unspecified: Secondary | ICD-10-CM | POA: Diagnosis not present

## 2022-04-22 DIAGNOSIS — Z4781 Encounter for orthopedic aftercare following surgical amputation: Secondary | ICD-10-CM | POA: Diagnosis not present

## 2022-04-22 DIAGNOSIS — R338 Other retention of urine: Secondary | ICD-10-CM | POA: Diagnosis not present

## 2022-04-22 DIAGNOSIS — I951 Orthostatic hypotension: Secondary | ICD-10-CM | POA: Diagnosis not present

## 2022-04-22 DIAGNOSIS — Z8616 Personal history of COVID-19: Secondary | ICD-10-CM | POA: Diagnosis not present

## 2022-04-22 DIAGNOSIS — H905 Unspecified sensorineural hearing loss: Secondary | ICD-10-CM | POA: Diagnosis not present

## 2022-04-22 DIAGNOSIS — Z794 Long term (current) use of insulin: Secondary | ICD-10-CM | POA: Diagnosis not present

## 2022-04-22 DIAGNOSIS — Z7984 Long term (current) use of oral hypoglycemic drugs: Secondary | ICD-10-CM | POA: Diagnosis not present

## 2022-04-22 DIAGNOSIS — E1122 Type 2 diabetes mellitus with diabetic chronic kidney disease: Secondary | ICD-10-CM | POA: Diagnosis not present

## 2022-04-22 DIAGNOSIS — I129 Hypertensive chronic kidney disease with stage 1 through stage 4 chronic kidney disease, or unspecified chronic kidney disease: Secondary | ICD-10-CM | POA: Diagnosis not present

## 2022-04-22 DIAGNOSIS — N1831 Chronic kidney disease, stage 3a: Secondary | ICD-10-CM | POA: Diagnosis not present

## 2022-04-22 DIAGNOSIS — D631 Anemia in chronic kidney disease: Secondary | ICD-10-CM | POA: Diagnosis not present

## 2022-04-22 DIAGNOSIS — K5730 Diverticulosis of large intestine without perforation or abscess without bleeding: Secondary | ICD-10-CM | POA: Diagnosis not present

## 2022-04-22 DIAGNOSIS — E785 Hyperlipidemia, unspecified: Secondary | ICD-10-CM | POA: Diagnosis not present

## 2022-04-22 DIAGNOSIS — Z8546 Personal history of malignant neoplasm of prostate: Secondary | ICD-10-CM | POA: Diagnosis not present

## 2022-04-25 DIAGNOSIS — Z4781 Encounter for orthopedic aftercare following surgical amputation: Secondary | ICD-10-CM | POA: Diagnosis not present

## 2022-04-25 DIAGNOSIS — I951 Orthostatic hypotension: Secondary | ICD-10-CM | POA: Diagnosis not present

## 2022-04-25 DIAGNOSIS — K5730 Diverticulosis of large intestine without perforation or abscess without bleeding: Secondary | ICD-10-CM | POA: Diagnosis not present

## 2022-04-25 DIAGNOSIS — N1831 Chronic kidney disease, stage 3a: Secondary | ICD-10-CM | POA: Diagnosis not present

## 2022-04-25 DIAGNOSIS — E1122 Type 2 diabetes mellitus with diabetic chronic kidney disease: Secondary | ICD-10-CM | POA: Diagnosis not present

## 2022-04-25 DIAGNOSIS — I129 Hypertensive chronic kidney disease with stage 1 through stage 4 chronic kidney disease, or unspecified chronic kidney disease: Secondary | ICD-10-CM | POA: Diagnosis not present

## 2022-04-27 DIAGNOSIS — K5730 Diverticulosis of large intestine without perforation or abscess without bleeding: Secondary | ICD-10-CM | POA: Diagnosis not present

## 2022-04-27 DIAGNOSIS — L97523 Non-pressure chronic ulcer of other part of left foot with necrosis of muscle: Secondary | ICD-10-CM | POA: Diagnosis not present

## 2022-04-27 DIAGNOSIS — N1831 Chronic kidney disease, stage 3a: Secondary | ICD-10-CM | POA: Diagnosis not present

## 2022-04-27 DIAGNOSIS — Z4781 Encounter for orthopedic aftercare following surgical amputation: Secondary | ICD-10-CM | POA: Diagnosis not present

## 2022-04-27 DIAGNOSIS — E1122 Type 2 diabetes mellitus with diabetic chronic kidney disease: Secondary | ICD-10-CM | POA: Diagnosis not present

## 2022-04-27 DIAGNOSIS — I951 Orthostatic hypotension: Secondary | ICD-10-CM | POA: Diagnosis not present

## 2022-04-27 DIAGNOSIS — I129 Hypertensive chronic kidney disease with stage 1 through stage 4 chronic kidney disease, or unspecified chronic kidney disease: Secondary | ICD-10-CM | POA: Diagnosis not present

## 2022-04-29 DIAGNOSIS — K5730 Diverticulosis of large intestine without perforation or abscess without bleeding: Secondary | ICD-10-CM | POA: Diagnosis not present

## 2022-04-29 DIAGNOSIS — N1831 Chronic kidney disease, stage 3a: Secondary | ICD-10-CM | POA: Diagnosis not present

## 2022-04-29 DIAGNOSIS — I951 Orthostatic hypotension: Secondary | ICD-10-CM | POA: Diagnosis not present

## 2022-04-29 DIAGNOSIS — E1122 Type 2 diabetes mellitus with diabetic chronic kidney disease: Secondary | ICD-10-CM | POA: Diagnosis not present

## 2022-04-29 DIAGNOSIS — Z4781 Encounter for orthopedic aftercare following surgical amputation: Secondary | ICD-10-CM | POA: Diagnosis not present

## 2022-04-29 DIAGNOSIS — I129 Hypertensive chronic kidney disease with stage 1 through stage 4 chronic kidney disease, or unspecified chronic kidney disease: Secondary | ICD-10-CM | POA: Diagnosis not present

## 2022-05-02 DIAGNOSIS — I951 Orthostatic hypotension: Secondary | ICD-10-CM | POA: Diagnosis not present

## 2022-05-02 DIAGNOSIS — I129 Hypertensive chronic kidney disease with stage 1 through stage 4 chronic kidney disease, or unspecified chronic kidney disease: Secondary | ICD-10-CM | POA: Diagnosis not present

## 2022-05-02 DIAGNOSIS — Z4781 Encounter for orthopedic aftercare following surgical amputation: Secondary | ICD-10-CM | POA: Diagnosis not present

## 2022-05-02 DIAGNOSIS — E1122 Type 2 diabetes mellitus with diabetic chronic kidney disease: Secondary | ICD-10-CM | POA: Diagnosis not present

## 2022-05-02 DIAGNOSIS — N1831 Chronic kidney disease, stage 3a: Secondary | ICD-10-CM | POA: Diagnosis not present

## 2022-05-02 DIAGNOSIS — K5730 Diverticulosis of large intestine without perforation or abscess without bleeding: Secondary | ICD-10-CM | POA: Diagnosis not present

## 2022-05-03 DIAGNOSIS — I951 Orthostatic hypotension: Secondary | ICD-10-CM | POA: Diagnosis not present

## 2022-05-03 DIAGNOSIS — Z4781 Encounter for orthopedic aftercare following surgical amputation: Secondary | ICD-10-CM | POA: Diagnosis not present

## 2022-05-03 DIAGNOSIS — K5730 Diverticulosis of large intestine without perforation or abscess without bleeding: Secondary | ICD-10-CM | POA: Diagnosis not present

## 2022-05-03 DIAGNOSIS — I129 Hypertensive chronic kidney disease with stage 1 through stage 4 chronic kidney disease, or unspecified chronic kidney disease: Secondary | ICD-10-CM | POA: Diagnosis not present

## 2022-05-03 DIAGNOSIS — E1122 Type 2 diabetes mellitus with diabetic chronic kidney disease: Secondary | ICD-10-CM | POA: Diagnosis not present

## 2022-05-03 DIAGNOSIS — N1831 Chronic kidney disease, stage 3a: Secondary | ICD-10-CM | POA: Diagnosis not present

## 2022-05-04 DIAGNOSIS — I951 Orthostatic hypotension: Secondary | ICD-10-CM | POA: Diagnosis not present

## 2022-05-04 DIAGNOSIS — E1122 Type 2 diabetes mellitus with diabetic chronic kidney disease: Secondary | ICD-10-CM | POA: Diagnosis not present

## 2022-05-04 DIAGNOSIS — Z4781 Encounter for orthopedic aftercare following surgical amputation: Secondary | ICD-10-CM | POA: Diagnosis not present

## 2022-05-04 DIAGNOSIS — N1831 Chronic kidney disease, stage 3a: Secondary | ICD-10-CM | POA: Diagnosis not present

## 2022-05-04 DIAGNOSIS — I129 Hypertensive chronic kidney disease with stage 1 through stage 4 chronic kidney disease, or unspecified chronic kidney disease: Secondary | ICD-10-CM | POA: Diagnosis not present

## 2022-05-04 DIAGNOSIS — K5730 Diverticulosis of large intestine without perforation or abscess without bleeding: Secondary | ICD-10-CM | POA: Diagnosis not present

## 2022-05-06 DIAGNOSIS — N1831 Chronic kidney disease, stage 3a: Secondary | ICD-10-CM | POA: Diagnosis not present

## 2022-05-06 DIAGNOSIS — Z4781 Encounter for orthopedic aftercare following surgical amputation: Secondary | ICD-10-CM | POA: Diagnosis not present

## 2022-05-06 DIAGNOSIS — I129 Hypertensive chronic kidney disease with stage 1 through stage 4 chronic kidney disease, or unspecified chronic kidney disease: Secondary | ICD-10-CM | POA: Diagnosis not present

## 2022-05-06 DIAGNOSIS — E1122 Type 2 diabetes mellitus with diabetic chronic kidney disease: Secondary | ICD-10-CM | POA: Diagnosis not present

## 2022-05-06 DIAGNOSIS — I951 Orthostatic hypotension: Secondary | ICD-10-CM | POA: Diagnosis not present

## 2022-05-06 DIAGNOSIS — K5730 Diverticulosis of large intestine without perforation or abscess without bleeding: Secondary | ICD-10-CM | POA: Diagnosis not present

## 2022-05-13 DIAGNOSIS — E1122 Type 2 diabetes mellitus with diabetic chronic kidney disease: Secondary | ICD-10-CM | POA: Diagnosis not present

## 2022-05-13 DIAGNOSIS — Z4781 Encounter for orthopedic aftercare following surgical amputation: Secondary | ICD-10-CM | POA: Diagnosis not present

## 2022-05-13 DIAGNOSIS — I951 Orthostatic hypotension: Secondary | ICD-10-CM | POA: Diagnosis not present

## 2022-05-13 DIAGNOSIS — K5730 Diverticulosis of large intestine without perforation or abscess without bleeding: Secondary | ICD-10-CM | POA: Diagnosis not present

## 2022-05-13 DIAGNOSIS — N1831 Chronic kidney disease, stage 3a: Secondary | ICD-10-CM | POA: Diagnosis not present

## 2022-05-13 DIAGNOSIS — I129 Hypertensive chronic kidney disease with stage 1 through stage 4 chronic kidney disease, or unspecified chronic kidney disease: Secondary | ICD-10-CM | POA: Diagnosis not present

## 2022-05-16 DIAGNOSIS — N1831 Chronic kidney disease, stage 3a: Secondary | ICD-10-CM | POA: Diagnosis not present

## 2022-05-16 DIAGNOSIS — Z4781 Encounter for orthopedic aftercare following surgical amputation: Secondary | ICD-10-CM | POA: Diagnosis not present

## 2022-05-16 DIAGNOSIS — I951 Orthostatic hypotension: Secondary | ICD-10-CM | POA: Diagnosis not present

## 2022-05-16 DIAGNOSIS — I129 Hypertensive chronic kidney disease with stage 1 through stage 4 chronic kidney disease, or unspecified chronic kidney disease: Secondary | ICD-10-CM | POA: Diagnosis not present

## 2022-05-16 DIAGNOSIS — E1122 Type 2 diabetes mellitus with diabetic chronic kidney disease: Secondary | ICD-10-CM | POA: Diagnosis not present

## 2022-05-16 DIAGNOSIS — K5730 Diverticulosis of large intestine without perforation or abscess without bleeding: Secondary | ICD-10-CM | POA: Diagnosis not present

## 2022-05-17 DIAGNOSIS — I129 Hypertensive chronic kidney disease with stage 1 through stage 4 chronic kidney disease, or unspecified chronic kidney disease: Secondary | ICD-10-CM | POA: Diagnosis not present

## 2022-05-17 DIAGNOSIS — I951 Orthostatic hypotension: Secondary | ICD-10-CM | POA: Diagnosis not present

## 2022-05-17 DIAGNOSIS — E1122 Type 2 diabetes mellitus with diabetic chronic kidney disease: Secondary | ICD-10-CM | POA: Diagnosis not present

## 2022-05-17 DIAGNOSIS — N1831 Chronic kidney disease, stage 3a: Secondary | ICD-10-CM | POA: Diagnosis not present

## 2022-05-17 DIAGNOSIS — Z4781 Encounter for orthopedic aftercare following surgical amputation: Secondary | ICD-10-CM | POA: Diagnosis not present

## 2022-05-17 DIAGNOSIS — K5730 Diverticulosis of large intestine without perforation or abscess without bleeding: Secondary | ICD-10-CM | POA: Diagnosis not present

## 2022-05-18 DIAGNOSIS — I951 Orthostatic hypotension: Secondary | ICD-10-CM | POA: Diagnosis not present

## 2022-05-18 DIAGNOSIS — E1122 Type 2 diabetes mellitus with diabetic chronic kidney disease: Secondary | ICD-10-CM | POA: Diagnosis not present

## 2022-05-18 DIAGNOSIS — Z4781 Encounter for orthopedic aftercare following surgical amputation: Secondary | ICD-10-CM | POA: Diagnosis not present

## 2022-05-18 DIAGNOSIS — N1831 Chronic kidney disease, stage 3a: Secondary | ICD-10-CM | POA: Diagnosis not present

## 2022-05-18 DIAGNOSIS — K5730 Diverticulosis of large intestine without perforation or abscess without bleeding: Secondary | ICD-10-CM | POA: Diagnosis not present

## 2022-05-18 DIAGNOSIS — I129 Hypertensive chronic kidney disease with stage 1 through stage 4 chronic kidney disease, or unspecified chronic kidney disease: Secondary | ICD-10-CM | POA: Diagnosis not present

## 2022-05-20 DIAGNOSIS — N1831 Chronic kidney disease, stage 3a: Secondary | ICD-10-CM | POA: Diagnosis not present

## 2022-05-20 DIAGNOSIS — I951 Orthostatic hypotension: Secondary | ICD-10-CM | POA: Diagnosis not present

## 2022-05-20 DIAGNOSIS — Z4781 Encounter for orthopedic aftercare following surgical amputation: Secondary | ICD-10-CM | POA: Diagnosis not present

## 2022-05-20 DIAGNOSIS — E1122 Type 2 diabetes mellitus with diabetic chronic kidney disease: Secondary | ICD-10-CM | POA: Diagnosis not present

## 2022-05-20 DIAGNOSIS — K5730 Diverticulosis of large intestine without perforation or abscess without bleeding: Secondary | ICD-10-CM | POA: Diagnosis not present

## 2022-05-20 DIAGNOSIS — I129 Hypertensive chronic kidney disease with stage 1 through stage 4 chronic kidney disease, or unspecified chronic kidney disease: Secondary | ICD-10-CM | POA: Diagnosis not present

## 2022-05-22 DIAGNOSIS — Z7984 Long term (current) use of oral hypoglycemic drugs: Secondary | ICD-10-CM | POA: Diagnosis not present

## 2022-05-22 DIAGNOSIS — K5730 Diverticulosis of large intestine without perforation or abscess without bleeding: Secondary | ICD-10-CM | POA: Diagnosis not present

## 2022-05-22 DIAGNOSIS — Z89411 Acquired absence of right great toe: Secondary | ICD-10-CM | POA: Diagnosis not present

## 2022-05-22 DIAGNOSIS — N1831 Chronic kidney disease, stage 3a: Secondary | ICD-10-CM | POA: Diagnosis not present

## 2022-05-22 DIAGNOSIS — I951 Orthostatic hypotension: Secondary | ICD-10-CM | POA: Diagnosis not present

## 2022-05-22 DIAGNOSIS — Z8701 Personal history of pneumonia (recurrent): Secondary | ICD-10-CM | POA: Diagnosis not present

## 2022-05-22 DIAGNOSIS — Z4781 Encounter for orthopedic aftercare following surgical amputation: Secondary | ICD-10-CM | POA: Diagnosis not present

## 2022-05-22 DIAGNOSIS — E785 Hyperlipidemia, unspecified: Secondary | ICD-10-CM | POA: Diagnosis not present

## 2022-05-22 DIAGNOSIS — I129 Hypertensive chronic kidney disease with stage 1 through stage 4 chronic kidney disease, or unspecified chronic kidney disease: Secondary | ICD-10-CM | POA: Diagnosis not present

## 2022-05-22 DIAGNOSIS — E1122 Type 2 diabetes mellitus with diabetic chronic kidney disease: Secondary | ICD-10-CM | POA: Diagnosis not present

## 2022-05-22 DIAGNOSIS — I480 Paroxysmal atrial fibrillation: Secondary | ICD-10-CM | POA: Diagnosis not present

## 2022-05-22 DIAGNOSIS — Z8546 Personal history of malignant neoplasm of prostate: Secondary | ICD-10-CM | POA: Diagnosis not present

## 2022-05-22 DIAGNOSIS — R338 Other retention of urine: Secondary | ICD-10-CM | POA: Diagnosis not present

## 2022-05-22 DIAGNOSIS — Z8616 Personal history of COVID-19: Secondary | ICD-10-CM | POA: Diagnosis not present

## 2022-05-22 DIAGNOSIS — D631 Anemia in chronic kidney disease: Secondary | ICD-10-CM | POA: Diagnosis not present

## 2022-05-22 DIAGNOSIS — H905 Unspecified sensorineural hearing loss: Secondary | ICD-10-CM | POA: Diagnosis not present

## 2022-05-22 DIAGNOSIS — Z794 Long term (current) use of insulin: Secondary | ICD-10-CM | POA: Diagnosis not present

## 2022-05-22 DIAGNOSIS — N401 Enlarged prostate with lower urinary tract symptoms: Secondary | ICD-10-CM | POA: Diagnosis not present

## 2022-05-22 DIAGNOSIS — E11621 Type 2 diabetes mellitus with foot ulcer: Secondary | ICD-10-CM | POA: Diagnosis not present

## 2022-05-22 DIAGNOSIS — F329 Major depressive disorder, single episode, unspecified: Secondary | ICD-10-CM | POA: Diagnosis not present

## 2022-05-22 DIAGNOSIS — L97523 Non-pressure chronic ulcer of other part of left foot with necrosis of muscle: Secondary | ICD-10-CM | POA: Diagnosis not present

## 2022-05-23 DIAGNOSIS — I700 Atherosclerosis of aorta: Secondary | ICD-10-CM | POA: Diagnosis not present

## 2022-05-23 DIAGNOSIS — Z79899 Other long term (current) drug therapy: Secondary | ICD-10-CM | POA: Diagnosis not present

## 2022-05-23 DIAGNOSIS — I129 Hypertensive chronic kidney disease with stage 1 through stage 4 chronic kidney disease, or unspecified chronic kidney disease: Secondary | ICD-10-CM | POA: Diagnosis not present

## 2022-05-23 DIAGNOSIS — Z89411 Acquired absence of right great toe: Secondary | ICD-10-CM | POA: Diagnosis not present

## 2022-05-23 DIAGNOSIS — K5730 Diverticulosis of large intestine without perforation or abscess without bleeding: Secondary | ICD-10-CM | POA: Diagnosis not present

## 2022-05-23 DIAGNOSIS — E1140 Type 2 diabetes mellitus with diabetic neuropathy, unspecified: Secondary | ICD-10-CM | POA: Diagnosis not present

## 2022-05-23 DIAGNOSIS — Z4781 Encounter for orthopedic aftercare following surgical amputation: Secondary | ICD-10-CM | POA: Diagnosis not present

## 2022-05-23 DIAGNOSIS — D509 Iron deficiency anemia, unspecified: Secondary | ICD-10-CM | POA: Diagnosis not present

## 2022-05-23 DIAGNOSIS — I10 Essential (primary) hypertension: Secondary | ICD-10-CM | POA: Diagnosis not present

## 2022-05-23 DIAGNOSIS — L97523 Non-pressure chronic ulcer of other part of left foot with necrosis of muscle: Secondary | ICD-10-CM | POA: Diagnosis not present

## 2022-05-23 DIAGNOSIS — Z6824 Body mass index (BMI) 24.0-24.9, adult: Secondary | ICD-10-CM | POA: Diagnosis not present

## 2022-05-23 DIAGNOSIS — E11621 Type 2 diabetes mellitus with foot ulcer: Secondary | ICD-10-CM | POA: Diagnosis not present

## 2022-05-23 DIAGNOSIS — D6859 Other primary thrombophilia: Secondary | ICD-10-CM | POA: Diagnosis not present

## 2022-05-23 DIAGNOSIS — I480 Paroxysmal atrial fibrillation: Secondary | ICD-10-CM | POA: Diagnosis not present

## 2022-05-23 DIAGNOSIS — K219 Gastro-esophageal reflux disease without esophagitis: Secondary | ICD-10-CM | POA: Diagnosis not present

## 2022-05-23 DIAGNOSIS — S98111A Complete traumatic amputation of right great toe, initial encounter: Secondary | ICD-10-CM | POA: Diagnosis not present

## 2022-05-23 DIAGNOSIS — F419 Anxiety disorder, unspecified: Secondary | ICD-10-CM | POA: Diagnosis not present

## 2022-05-24 DIAGNOSIS — B9689 Other specified bacterial agents as the cause of diseases classified elsewhere: Secondary | ICD-10-CM | POA: Diagnosis present

## 2022-05-24 DIAGNOSIS — T8789 Other complications of amputation stump: Secondary | ICD-10-CM | POA: Diagnosis not present

## 2022-05-24 DIAGNOSIS — H9193 Unspecified hearing loss, bilateral: Secondary | ICD-10-CM | POA: Diagnosis present

## 2022-05-24 DIAGNOSIS — Z794 Long term (current) use of insulin: Secondary | ICD-10-CM | POA: Diagnosis not present

## 2022-05-24 DIAGNOSIS — L97428 Non-pressure chronic ulcer of left heel and midfoot with other specified severity: Secondary | ICD-10-CM | POA: Diagnosis not present

## 2022-05-24 DIAGNOSIS — N1832 Chronic kidney disease, stage 3b: Secondary | ICD-10-CM | POA: Diagnosis present

## 2022-05-24 DIAGNOSIS — I7025 Atherosclerosis of native arteries of other extremities with ulceration: Secondary | ICD-10-CM | POA: Diagnosis present

## 2022-05-24 DIAGNOSIS — I10 Essential (primary) hypertension: Secondary | ICD-10-CM | POA: Diagnosis not present

## 2022-05-24 DIAGNOSIS — Z9889 Other specified postprocedural states: Secondary | ICD-10-CM | POA: Diagnosis not present

## 2022-05-24 DIAGNOSIS — M00072 Staphylococcal arthritis, left ankle and foot: Secondary | ICD-10-CM | POA: Diagnosis present

## 2022-05-24 DIAGNOSIS — B957 Other staphylococcus as the cause of diseases classified elsewhere: Secondary | ICD-10-CM | POA: Diagnosis present

## 2022-05-24 DIAGNOSIS — H409 Unspecified glaucoma: Secondary | ICD-10-CM | POA: Diagnosis present

## 2022-05-24 DIAGNOSIS — M86172 Other acute osteomyelitis, left ankle and foot: Secondary | ICD-10-CM | POA: Diagnosis present

## 2022-05-24 DIAGNOSIS — Z79899 Other long term (current) drug therapy: Secondary | ICD-10-CM | POA: Diagnosis not present

## 2022-05-24 DIAGNOSIS — L97523 Non-pressure chronic ulcer of other part of left foot with necrosis of muscle: Secondary | ICD-10-CM | POA: Diagnosis not present

## 2022-05-24 DIAGNOSIS — E1139 Type 2 diabetes mellitus with other diabetic ophthalmic complication: Secondary | ICD-10-CM | POA: Diagnosis present

## 2022-05-24 DIAGNOSIS — I739 Peripheral vascular disease, unspecified: Secondary | ICD-10-CM | POA: Diagnosis not present

## 2022-05-24 DIAGNOSIS — H42 Glaucoma in diseases classified elsewhere: Secondary | ICD-10-CM | POA: Diagnosis present

## 2022-05-24 DIAGNOSIS — L03116 Cellulitis of left lower limb: Secondary | ICD-10-CM | POA: Diagnosis present

## 2022-05-24 DIAGNOSIS — D631 Anemia in chronic kidney disease: Secondary | ICD-10-CM | POA: Diagnosis present

## 2022-05-24 DIAGNOSIS — M868X7 Other osteomyelitis, ankle and foot: Secondary | ICD-10-CM | POA: Diagnosis not present

## 2022-05-24 DIAGNOSIS — E1122 Type 2 diabetes mellitus with diabetic chronic kidney disease: Secondary | ICD-10-CM | POA: Diagnosis present

## 2022-05-24 DIAGNOSIS — B9562 Methicillin resistant Staphylococcus aureus infection as the cause of diseases classified elsewhere: Secondary | ICD-10-CM | POA: Diagnosis present

## 2022-05-24 DIAGNOSIS — E1169 Type 2 diabetes mellitus with other specified complication: Secondary | ICD-10-CM | POA: Diagnosis present

## 2022-05-24 DIAGNOSIS — E11628 Type 2 diabetes mellitus with other skin complications: Secondary | ICD-10-CM | POA: Diagnosis present

## 2022-05-24 DIAGNOSIS — M869 Osteomyelitis, unspecified: Secondary | ICD-10-CM | POA: Diagnosis not present

## 2022-05-24 DIAGNOSIS — E785 Hyperlipidemia, unspecified: Secondary | ICD-10-CM | POA: Diagnosis present

## 2022-05-24 DIAGNOSIS — E1151 Type 2 diabetes mellitus with diabetic peripheral angiopathy without gangrene: Secondary | ICD-10-CM | POA: Diagnosis present

## 2022-05-24 DIAGNOSIS — L97423 Non-pressure chronic ulcer of left heel and midfoot with necrosis of muscle: Secondary | ICD-10-CM | POA: Diagnosis present

## 2022-05-24 DIAGNOSIS — E11621 Type 2 diabetes mellitus with foot ulcer: Secondary | ICD-10-CM | POA: Diagnosis present

## 2022-05-24 DIAGNOSIS — Z7984 Long term (current) use of oral hypoglycemic drugs: Secondary | ICD-10-CM | POA: Diagnosis not present

## 2022-05-24 DIAGNOSIS — I129 Hypertensive chronic kidney disease with stage 1 through stage 4 chronic kidney disease, or unspecified chronic kidney disease: Secondary | ICD-10-CM | POA: Diagnosis present

## 2022-05-25 DIAGNOSIS — E1139 Type 2 diabetes mellitus with other diabetic ophthalmic complication: Secondary | ICD-10-CM | POA: Diagnosis present

## 2022-05-25 DIAGNOSIS — Z79899 Other long term (current) drug therapy: Secondary | ICD-10-CM | POA: Diagnosis not present

## 2022-05-25 DIAGNOSIS — B9689 Other specified bacterial agents as the cause of diseases classified elsewhere: Secondary | ICD-10-CM | POA: Diagnosis present

## 2022-05-25 DIAGNOSIS — E11628 Type 2 diabetes mellitus with other skin complications: Secondary | ICD-10-CM | POA: Diagnosis present

## 2022-05-25 DIAGNOSIS — Z794 Long term (current) use of insulin: Secondary | ICD-10-CM | POA: Diagnosis not present

## 2022-05-25 DIAGNOSIS — N1832 Chronic kidney disease, stage 3b: Secondary | ICD-10-CM | POA: Diagnosis present

## 2022-05-25 DIAGNOSIS — L97423 Non-pressure chronic ulcer of left heel and midfoot with necrosis of muscle: Secondary | ICD-10-CM | POA: Diagnosis present

## 2022-05-25 DIAGNOSIS — M00072 Staphylococcal arthritis, left ankle and foot: Secondary | ICD-10-CM | POA: Diagnosis present

## 2022-05-25 DIAGNOSIS — M869 Osteomyelitis, unspecified: Secondary | ICD-10-CM | POA: Diagnosis not present

## 2022-05-25 DIAGNOSIS — D631 Anemia in chronic kidney disease: Secondary | ICD-10-CM | POA: Diagnosis present

## 2022-05-25 DIAGNOSIS — E1122 Type 2 diabetes mellitus with diabetic chronic kidney disease: Secondary | ICD-10-CM | POA: Diagnosis present

## 2022-05-25 DIAGNOSIS — H409 Unspecified glaucoma: Secondary | ICD-10-CM | POA: Diagnosis present

## 2022-05-25 DIAGNOSIS — L03116 Cellulitis of left lower limb: Secondary | ICD-10-CM | POA: Diagnosis present

## 2022-05-25 DIAGNOSIS — Z7984 Long term (current) use of oral hypoglycemic drugs: Secondary | ICD-10-CM | POA: Diagnosis not present

## 2022-05-25 DIAGNOSIS — B957 Other staphylococcus as the cause of diseases classified elsewhere: Secondary | ICD-10-CM | POA: Diagnosis present

## 2022-05-25 DIAGNOSIS — I129 Hypertensive chronic kidney disease with stage 1 through stage 4 chronic kidney disease, or unspecified chronic kidney disease: Secondary | ICD-10-CM | POA: Diagnosis present

## 2022-05-25 DIAGNOSIS — M86172 Other acute osteomyelitis, left ankle and foot: Secondary | ICD-10-CM | POA: Diagnosis present

## 2022-05-25 DIAGNOSIS — H42 Glaucoma in diseases classified elsewhere: Secondary | ICD-10-CM | POA: Diagnosis present

## 2022-05-25 DIAGNOSIS — E11621 Type 2 diabetes mellitus with foot ulcer: Secondary | ICD-10-CM | POA: Diagnosis present

## 2022-05-25 DIAGNOSIS — E1151 Type 2 diabetes mellitus with diabetic peripheral angiopathy without gangrene: Secondary | ICD-10-CM | POA: Diagnosis present

## 2022-05-25 DIAGNOSIS — H9193 Unspecified hearing loss, bilateral: Secondary | ICD-10-CM | POA: Diagnosis present

## 2022-05-25 DIAGNOSIS — B9562 Methicillin resistant Staphylococcus aureus infection as the cause of diseases classified elsewhere: Secondary | ICD-10-CM | POA: Diagnosis present

## 2022-05-25 DIAGNOSIS — I7025 Atherosclerosis of native arteries of other extremities with ulceration: Secondary | ICD-10-CM | POA: Diagnosis present

## 2022-05-25 DIAGNOSIS — E785 Hyperlipidemia, unspecified: Secondary | ICD-10-CM | POA: Diagnosis present

## 2022-05-25 DIAGNOSIS — E1169 Type 2 diabetes mellitus with other specified complication: Secondary | ICD-10-CM | POA: Diagnosis present

## 2022-05-27 DIAGNOSIS — I129 Hypertensive chronic kidney disease with stage 1 through stage 4 chronic kidney disease, or unspecified chronic kidney disease: Secondary | ICD-10-CM | POA: Diagnosis not present

## 2022-05-27 DIAGNOSIS — Z4781 Encounter for orthopedic aftercare following surgical amputation: Secondary | ICD-10-CM | POA: Diagnosis not present

## 2022-05-27 DIAGNOSIS — M86172 Other acute osteomyelitis, left ankle and foot: Secondary | ICD-10-CM | POA: Diagnosis not present

## 2022-05-27 DIAGNOSIS — L97523 Non-pressure chronic ulcer of other part of left foot with necrosis of muscle: Secondary | ICD-10-CM | POA: Diagnosis not present

## 2022-05-27 DIAGNOSIS — L03116 Cellulitis of left lower limb: Secondary | ICD-10-CM | POA: Diagnosis not present

## 2022-05-27 DIAGNOSIS — L97423 Non-pressure chronic ulcer of left heel and midfoot with necrosis of muscle: Secondary | ICD-10-CM | POA: Diagnosis not present

## 2022-05-27 DIAGNOSIS — K5730 Diverticulosis of large intestine without perforation or abscess without bleeding: Secondary | ICD-10-CM | POA: Diagnosis not present

## 2022-05-27 DIAGNOSIS — E11621 Type 2 diabetes mellitus with foot ulcer: Secondary | ICD-10-CM | POA: Diagnosis not present

## 2022-05-27 DIAGNOSIS — E1169 Type 2 diabetes mellitus with other specified complication: Secondary | ICD-10-CM | POA: Diagnosis not present

## 2022-05-27 DIAGNOSIS — Z89411 Acquired absence of right great toe: Secondary | ICD-10-CM | POA: Diagnosis not present

## 2022-05-30 DIAGNOSIS — K5730 Diverticulosis of large intestine without perforation or abscess without bleeding: Secondary | ICD-10-CM | POA: Diagnosis not present

## 2022-05-30 DIAGNOSIS — L97523 Non-pressure chronic ulcer of other part of left foot with necrosis of muscle: Secondary | ICD-10-CM | POA: Diagnosis not present

## 2022-05-30 DIAGNOSIS — I129 Hypertensive chronic kidney disease with stage 1 through stage 4 chronic kidney disease, or unspecified chronic kidney disease: Secondary | ICD-10-CM | POA: Diagnosis not present

## 2022-05-30 DIAGNOSIS — E11621 Type 2 diabetes mellitus with foot ulcer: Secondary | ICD-10-CM | POA: Diagnosis not present

## 2022-05-30 DIAGNOSIS — Z89411 Acquired absence of right great toe: Secondary | ICD-10-CM | POA: Diagnosis not present

## 2022-05-30 DIAGNOSIS — Z4781 Encounter for orthopedic aftercare following surgical amputation: Secondary | ICD-10-CM | POA: Diagnosis not present

## 2022-06-02 DIAGNOSIS — K5730 Diverticulosis of large intestine without perforation or abscess without bleeding: Secondary | ICD-10-CM | POA: Diagnosis not present

## 2022-06-02 DIAGNOSIS — L97523 Non-pressure chronic ulcer of other part of left foot with necrosis of muscle: Secondary | ICD-10-CM | POA: Diagnosis not present

## 2022-06-02 DIAGNOSIS — I129 Hypertensive chronic kidney disease with stage 1 through stage 4 chronic kidney disease, or unspecified chronic kidney disease: Secondary | ICD-10-CM | POA: Diagnosis not present

## 2022-06-02 DIAGNOSIS — Z89411 Acquired absence of right great toe: Secondary | ICD-10-CM | POA: Diagnosis not present

## 2022-06-02 DIAGNOSIS — E11621 Type 2 diabetes mellitus with foot ulcer: Secondary | ICD-10-CM | POA: Diagnosis not present

## 2022-06-02 DIAGNOSIS — Z4781 Encounter for orthopedic aftercare following surgical amputation: Secondary | ICD-10-CM | POA: Diagnosis not present

## 2022-06-06 DIAGNOSIS — Z4781 Encounter for orthopedic aftercare following surgical amputation: Secondary | ICD-10-CM | POA: Diagnosis not present

## 2022-06-06 DIAGNOSIS — K5730 Diverticulosis of large intestine without perforation or abscess without bleeding: Secondary | ICD-10-CM | POA: Diagnosis not present

## 2022-06-06 DIAGNOSIS — L97523 Non-pressure chronic ulcer of other part of left foot with necrosis of muscle: Secondary | ICD-10-CM | POA: Diagnosis not present

## 2022-06-06 DIAGNOSIS — Z89411 Acquired absence of right great toe: Secondary | ICD-10-CM | POA: Diagnosis not present

## 2022-06-06 DIAGNOSIS — E11621 Type 2 diabetes mellitus with foot ulcer: Secondary | ICD-10-CM | POA: Diagnosis not present

## 2022-06-06 DIAGNOSIS — I129 Hypertensive chronic kidney disease with stage 1 through stage 4 chronic kidney disease, or unspecified chronic kidney disease: Secondary | ICD-10-CM | POA: Diagnosis not present

## 2022-06-07 DIAGNOSIS — I129 Hypertensive chronic kidney disease with stage 1 through stage 4 chronic kidney disease, or unspecified chronic kidney disease: Secondary | ICD-10-CM | POA: Diagnosis not present

## 2022-06-07 DIAGNOSIS — Z4781 Encounter for orthopedic aftercare following surgical amputation: Secondary | ICD-10-CM | POA: Diagnosis not present

## 2022-06-07 DIAGNOSIS — K5730 Diverticulosis of large intestine without perforation or abscess without bleeding: Secondary | ICD-10-CM | POA: Diagnosis not present

## 2022-06-07 DIAGNOSIS — Z89411 Acquired absence of right great toe: Secondary | ICD-10-CM | POA: Diagnosis not present

## 2022-06-07 DIAGNOSIS — L97523 Non-pressure chronic ulcer of other part of left foot with necrosis of muscle: Secondary | ICD-10-CM | POA: Diagnosis not present

## 2022-06-07 DIAGNOSIS — E11621 Type 2 diabetes mellitus with foot ulcer: Secondary | ICD-10-CM | POA: Diagnosis not present

## 2022-06-09 DIAGNOSIS — R209 Unspecified disturbances of skin sensation: Secondary | ICD-10-CM | POA: Diagnosis not present

## 2022-06-09 DIAGNOSIS — Z6823 Body mass index (BMI) 23.0-23.9, adult: Secondary | ICD-10-CM | POA: Diagnosis not present

## 2022-06-09 DIAGNOSIS — R109 Unspecified abdominal pain: Secondary | ICD-10-CM | POA: Diagnosis not present

## 2022-06-09 DIAGNOSIS — R630 Anorexia: Secondary | ICD-10-CM | POA: Diagnosis not present

## 2022-06-09 DIAGNOSIS — I10 Essential (primary) hypertension: Secondary | ICD-10-CM | POA: Diagnosis not present

## 2022-06-09 DIAGNOSIS — S98112A Complete traumatic amputation of left great toe, initial encounter: Secondary | ICD-10-CM | POA: Diagnosis not present

## 2022-06-09 DIAGNOSIS — E1140 Type 2 diabetes mellitus with diabetic neuropathy, unspecified: Secondary | ICD-10-CM | POA: Diagnosis not present

## 2022-06-10 DIAGNOSIS — Z89411 Acquired absence of right great toe: Secondary | ICD-10-CM | POA: Diagnosis not present

## 2022-06-10 DIAGNOSIS — E11621 Type 2 diabetes mellitus with foot ulcer: Secondary | ICD-10-CM | POA: Diagnosis not present

## 2022-06-10 DIAGNOSIS — L97523 Non-pressure chronic ulcer of other part of left foot with necrosis of muscle: Secondary | ICD-10-CM | POA: Diagnosis not present

## 2022-06-10 DIAGNOSIS — Z4781 Encounter for orthopedic aftercare following surgical amputation: Secondary | ICD-10-CM | POA: Diagnosis not present

## 2022-06-10 DIAGNOSIS — I129 Hypertensive chronic kidney disease with stage 1 through stage 4 chronic kidney disease, or unspecified chronic kidney disease: Secondary | ICD-10-CM | POA: Diagnosis not present

## 2022-06-10 DIAGNOSIS — K5730 Diverticulosis of large intestine without perforation or abscess without bleeding: Secondary | ICD-10-CM | POA: Diagnosis not present

## 2022-06-13 DIAGNOSIS — Z4781 Encounter for orthopedic aftercare following surgical amputation: Secondary | ICD-10-CM | POA: Diagnosis not present

## 2022-06-13 DIAGNOSIS — L97523 Non-pressure chronic ulcer of other part of left foot with necrosis of muscle: Secondary | ICD-10-CM | POA: Diagnosis not present

## 2022-06-13 DIAGNOSIS — Z23 Encounter for immunization: Secondary | ICD-10-CM | POA: Diagnosis not present

## 2022-06-13 DIAGNOSIS — Z89411 Acquired absence of right great toe: Secondary | ICD-10-CM | POA: Diagnosis not present

## 2022-06-13 DIAGNOSIS — K5730 Diverticulosis of large intestine without perforation or abscess without bleeding: Secondary | ICD-10-CM | POA: Diagnosis not present

## 2022-06-13 DIAGNOSIS — E11621 Type 2 diabetes mellitus with foot ulcer: Secondary | ICD-10-CM | POA: Diagnosis not present

## 2022-06-13 DIAGNOSIS — I129 Hypertensive chronic kidney disease with stage 1 through stage 4 chronic kidney disease, or unspecified chronic kidney disease: Secondary | ICD-10-CM | POA: Diagnosis not present

## 2022-06-14 DIAGNOSIS — Z4781 Encounter for orthopedic aftercare following surgical amputation: Secondary | ICD-10-CM | POA: Diagnosis not present

## 2022-06-14 DIAGNOSIS — E11621 Type 2 diabetes mellitus with foot ulcer: Secondary | ICD-10-CM | POA: Diagnosis not present

## 2022-06-14 DIAGNOSIS — L97523 Non-pressure chronic ulcer of other part of left foot with necrosis of muscle: Secondary | ICD-10-CM | POA: Diagnosis not present

## 2022-06-14 DIAGNOSIS — K5730 Diverticulosis of large intestine without perforation or abscess without bleeding: Secondary | ICD-10-CM | POA: Diagnosis not present

## 2022-06-14 DIAGNOSIS — I129 Hypertensive chronic kidney disease with stage 1 through stage 4 chronic kidney disease, or unspecified chronic kidney disease: Secondary | ICD-10-CM | POA: Diagnosis not present

## 2022-06-14 DIAGNOSIS — Z89411 Acquired absence of right great toe: Secondary | ICD-10-CM | POA: Diagnosis not present

## 2022-06-17 DIAGNOSIS — Z89411 Acquired absence of right great toe: Secondary | ICD-10-CM | POA: Diagnosis not present

## 2022-06-17 DIAGNOSIS — L97523 Non-pressure chronic ulcer of other part of left foot with necrosis of muscle: Secondary | ICD-10-CM | POA: Diagnosis not present

## 2022-06-17 DIAGNOSIS — I129 Hypertensive chronic kidney disease with stage 1 through stage 4 chronic kidney disease, or unspecified chronic kidney disease: Secondary | ICD-10-CM | POA: Diagnosis not present

## 2022-06-17 DIAGNOSIS — K5730 Diverticulosis of large intestine without perforation or abscess without bleeding: Secondary | ICD-10-CM | POA: Diagnosis not present

## 2022-06-17 DIAGNOSIS — E11621 Type 2 diabetes mellitus with foot ulcer: Secondary | ICD-10-CM | POA: Diagnosis not present

## 2022-06-17 DIAGNOSIS — Z4781 Encounter for orthopedic aftercare following surgical amputation: Secondary | ICD-10-CM | POA: Diagnosis not present

## 2022-06-20 DIAGNOSIS — Z89411 Acquired absence of right great toe: Secondary | ICD-10-CM | POA: Diagnosis not present

## 2022-06-20 DIAGNOSIS — E11621 Type 2 diabetes mellitus with foot ulcer: Secondary | ICD-10-CM | POA: Diagnosis not present

## 2022-06-20 DIAGNOSIS — I129 Hypertensive chronic kidney disease with stage 1 through stage 4 chronic kidney disease, or unspecified chronic kidney disease: Secondary | ICD-10-CM | POA: Diagnosis not present

## 2022-06-20 DIAGNOSIS — L97523 Non-pressure chronic ulcer of other part of left foot with necrosis of muscle: Secondary | ICD-10-CM | POA: Diagnosis not present

## 2022-06-20 DIAGNOSIS — Z4781 Encounter for orthopedic aftercare following surgical amputation: Secondary | ICD-10-CM | POA: Diagnosis not present

## 2022-06-20 DIAGNOSIS — K5730 Diverticulosis of large intestine without perforation or abscess without bleeding: Secondary | ICD-10-CM | POA: Diagnosis not present

## 2022-06-21 DIAGNOSIS — D631 Anemia in chronic kidney disease: Secondary | ICD-10-CM | POA: Diagnosis not present

## 2022-06-21 DIAGNOSIS — Z89411 Acquired absence of right great toe: Secondary | ICD-10-CM | POA: Diagnosis not present

## 2022-06-21 DIAGNOSIS — Z8546 Personal history of malignant neoplasm of prostate: Secondary | ICD-10-CM | POA: Diagnosis not present

## 2022-06-21 DIAGNOSIS — F419 Anxiety disorder, unspecified: Secondary | ICD-10-CM | POA: Diagnosis not present

## 2022-06-21 DIAGNOSIS — K5730 Diverticulosis of large intestine without perforation or abscess without bleeding: Secondary | ICD-10-CM | POA: Diagnosis not present

## 2022-06-21 DIAGNOSIS — Z794 Long term (current) use of insulin: Secondary | ICD-10-CM | POA: Diagnosis not present

## 2022-06-21 DIAGNOSIS — Z8701 Personal history of pneumonia (recurrent): Secondary | ICD-10-CM | POA: Diagnosis not present

## 2022-06-21 DIAGNOSIS — F329 Major depressive disorder, single episode, unspecified: Secondary | ICD-10-CM | POA: Diagnosis not present

## 2022-06-21 DIAGNOSIS — Z8616 Personal history of COVID-19: Secondary | ICD-10-CM | POA: Diagnosis not present

## 2022-06-21 DIAGNOSIS — I951 Orthostatic hypotension: Secondary | ICD-10-CM | POA: Diagnosis not present

## 2022-06-21 DIAGNOSIS — E785 Hyperlipidemia, unspecified: Secondary | ICD-10-CM | POA: Diagnosis not present

## 2022-06-21 DIAGNOSIS — E1122 Type 2 diabetes mellitus with diabetic chronic kidney disease: Secondary | ICD-10-CM | POA: Diagnosis not present

## 2022-06-21 DIAGNOSIS — I700 Atherosclerosis of aorta: Secondary | ICD-10-CM | POA: Diagnosis not present

## 2022-06-21 DIAGNOSIS — Z7984 Long term (current) use of oral hypoglycemic drugs: Secondary | ICD-10-CM | POA: Diagnosis not present

## 2022-06-21 DIAGNOSIS — I129 Hypertensive chronic kidney disease with stage 1 through stage 4 chronic kidney disease, or unspecified chronic kidney disease: Secondary | ICD-10-CM | POA: Diagnosis not present

## 2022-06-21 DIAGNOSIS — N401 Enlarged prostate with lower urinary tract symptoms: Secondary | ICD-10-CM | POA: Diagnosis not present

## 2022-06-21 DIAGNOSIS — Z89412 Acquired absence of left great toe: Secondary | ICD-10-CM | POA: Diagnosis not present

## 2022-06-21 DIAGNOSIS — H905 Unspecified sensorineural hearing loss: Secondary | ICD-10-CM | POA: Diagnosis not present

## 2022-06-21 DIAGNOSIS — R338 Other retention of urine: Secondary | ICD-10-CM | POA: Diagnosis not present

## 2022-06-21 DIAGNOSIS — E1140 Type 2 diabetes mellitus with diabetic neuropathy, unspecified: Secondary | ICD-10-CM | POA: Diagnosis not present

## 2022-06-21 DIAGNOSIS — I480 Paroxysmal atrial fibrillation: Secondary | ICD-10-CM | POA: Diagnosis not present

## 2022-06-21 DIAGNOSIS — N1831 Chronic kidney disease, stage 3a: Secondary | ICD-10-CM | POA: Diagnosis not present

## 2022-06-21 DIAGNOSIS — Z4781 Encounter for orthopedic aftercare following surgical amputation: Secondary | ICD-10-CM | POA: Diagnosis not present

## 2022-06-24 DIAGNOSIS — I10 Essential (primary) hypertension: Secondary | ICD-10-CM | POA: Diagnosis not present

## 2022-06-24 DIAGNOSIS — M7989 Other specified soft tissue disorders: Secondary | ICD-10-CM | POA: Diagnosis not present

## 2022-06-24 DIAGNOSIS — K5730 Diverticulosis of large intestine without perforation or abscess without bleeding: Secondary | ICD-10-CM | POA: Diagnosis not present

## 2022-06-24 DIAGNOSIS — L539 Erythematous condition, unspecified: Secondary | ICD-10-CM | POA: Diagnosis not present

## 2022-06-24 DIAGNOSIS — Z7984 Long term (current) use of oral hypoglycemic drugs: Secondary | ICD-10-CM | POA: Diagnosis not present

## 2022-06-24 DIAGNOSIS — I739 Peripheral vascular disease, unspecified: Secondary | ICD-10-CM | POA: Diagnosis not present

## 2022-06-24 DIAGNOSIS — I129 Hypertensive chronic kidney disease with stage 1 through stage 4 chronic kidney disease, or unspecified chronic kidney disease: Secondary | ICD-10-CM | POA: Diagnosis not present

## 2022-06-24 DIAGNOSIS — L97524 Non-pressure chronic ulcer of other part of left foot with necrosis of bone: Secondary | ICD-10-CM | POA: Diagnosis not present

## 2022-06-24 DIAGNOSIS — M154 Erosive (osteo)arthritis: Secondary | ICD-10-CM | POA: Diagnosis not present

## 2022-06-24 DIAGNOSIS — E1122 Type 2 diabetes mellitus with diabetic chronic kidney disease: Secondary | ICD-10-CM | POA: Diagnosis not present

## 2022-06-24 DIAGNOSIS — Z794 Long term (current) use of insulin: Secondary | ICD-10-CM | POA: Diagnosis not present

## 2022-06-24 DIAGNOSIS — Z89412 Acquired absence of left great toe: Secondary | ICD-10-CM | POA: Diagnosis not present

## 2022-06-24 DIAGNOSIS — Z87891 Personal history of nicotine dependence: Secondary | ICD-10-CM | POA: Diagnosis not present

## 2022-06-24 DIAGNOSIS — L089 Local infection of the skin and subcutaneous tissue, unspecified: Secondary | ICD-10-CM | POA: Diagnosis not present

## 2022-06-24 DIAGNOSIS — Z89411 Acquired absence of right great toe: Secondary | ICD-10-CM | POA: Diagnosis not present

## 2022-06-24 DIAGNOSIS — Z4781 Encounter for orthopedic aftercare following surgical amputation: Secondary | ICD-10-CM | POA: Diagnosis not present

## 2022-06-24 DIAGNOSIS — E11628 Type 2 diabetes mellitus with other skin complications: Secondary | ICD-10-CM | POA: Diagnosis not present

## 2022-06-25 DIAGNOSIS — L089 Local infection of the skin and subcutaneous tissue, unspecified: Secondary | ICD-10-CM | POA: Diagnosis not present

## 2022-06-25 DIAGNOSIS — E785 Hyperlipidemia, unspecified: Secondary | ICD-10-CM | POA: Diagnosis not present

## 2022-06-25 DIAGNOSIS — E1122 Type 2 diabetes mellitus with diabetic chronic kidney disease: Secondary | ICD-10-CM | POA: Diagnosis not present

## 2022-06-25 DIAGNOSIS — I129 Hypertensive chronic kidney disease with stage 1 through stage 4 chronic kidney disease, or unspecified chronic kidney disease: Secondary | ICD-10-CM | POA: Diagnosis not present

## 2022-06-25 DIAGNOSIS — Z87891 Personal history of nicotine dependence: Secondary | ICD-10-CM | POA: Diagnosis not present

## 2022-06-25 DIAGNOSIS — M86172 Other acute osteomyelitis, left ankle and foot: Secondary | ICD-10-CM | POA: Diagnosis not present

## 2022-06-25 DIAGNOSIS — Z79899 Other long term (current) drug therapy: Secondary | ICD-10-CM | POA: Diagnosis not present

## 2022-06-25 DIAGNOSIS — Z7984 Long term (current) use of oral hypoglycemic drugs: Secondary | ICD-10-CM | POA: Diagnosis not present

## 2022-06-25 DIAGNOSIS — E1165 Type 2 diabetes mellitus with hyperglycemia: Secondary | ICD-10-CM | POA: Diagnosis not present

## 2022-06-25 DIAGNOSIS — N1832 Chronic kidney disease, stage 3b: Secondary | ICD-10-CM | POA: Diagnosis not present

## 2022-06-25 DIAGNOSIS — I70261 Atherosclerosis of native arteries of extremities with gangrene, right leg: Secondary | ICD-10-CM | POA: Diagnosis not present

## 2022-06-25 DIAGNOSIS — N1831 Chronic kidney disease, stage 3a: Secondary | ICD-10-CM | POA: Diagnosis not present

## 2022-06-25 DIAGNOSIS — Z89411 Acquired absence of right great toe: Secondary | ICD-10-CM | POA: Diagnosis not present

## 2022-06-25 DIAGNOSIS — I10 Essential (primary) hypertension: Secondary | ICD-10-CM | POA: Diagnosis not present

## 2022-06-25 DIAGNOSIS — E11628 Type 2 diabetes mellitus with other skin complications: Secondary | ICD-10-CM | POA: Diagnosis not present

## 2022-06-25 DIAGNOSIS — M86672 Other chronic osteomyelitis, left ankle and foot: Secondary | ICD-10-CM | POA: Diagnosis not present

## 2022-06-25 DIAGNOSIS — Z794 Long term (current) use of insulin: Secondary | ICD-10-CM | POA: Diagnosis not present

## 2022-06-25 DIAGNOSIS — E1169 Type 2 diabetes mellitus with other specified complication: Secondary | ICD-10-CM | POA: Diagnosis not present

## 2022-06-28 DIAGNOSIS — I129 Hypertensive chronic kidney disease with stage 1 through stage 4 chronic kidney disease, or unspecified chronic kidney disease: Secondary | ICD-10-CM | POA: Diagnosis not present

## 2022-06-28 DIAGNOSIS — Z89411 Acquired absence of right great toe: Secondary | ICD-10-CM | POA: Diagnosis not present

## 2022-06-28 DIAGNOSIS — K5730 Diverticulosis of large intestine without perforation or abscess without bleeding: Secondary | ICD-10-CM | POA: Diagnosis not present

## 2022-06-28 DIAGNOSIS — E1122 Type 2 diabetes mellitus with diabetic chronic kidney disease: Secondary | ICD-10-CM | POA: Diagnosis not present

## 2022-06-28 DIAGNOSIS — E1169 Type 2 diabetes mellitus with other specified complication: Secondary | ICD-10-CM | POA: Diagnosis not present

## 2022-06-28 DIAGNOSIS — Z89412 Acquired absence of left great toe: Secondary | ICD-10-CM | POA: Diagnosis not present

## 2022-06-28 DIAGNOSIS — Z4781 Encounter for orthopedic aftercare following surgical amputation: Secondary | ICD-10-CM | POA: Diagnosis not present

## 2022-06-28 DIAGNOSIS — M86172 Other acute osteomyelitis, left ankle and foot: Secondary | ICD-10-CM | POA: Diagnosis not present

## 2022-06-29 DIAGNOSIS — Z89412 Acquired absence of left great toe: Secondary | ICD-10-CM | POA: Diagnosis not present

## 2022-06-29 DIAGNOSIS — Z89411 Acquired absence of right great toe: Secondary | ICD-10-CM | POA: Diagnosis not present

## 2022-06-29 DIAGNOSIS — I129 Hypertensive chronic kidney disease with stage 1 through stage 4 chronic kidney disease, or unspecified chronic kidney disease: Secondary | ICD-10-CM | POA: Diagnosis not present

## 2022-06-29 DIAGNOSIS — Z4781 Encounter for orthopedic aftercare following surgical amputation: Secondary | ICD-10-CM | POA: Diagnosis not present

## 2022-06-29 DIAGNOSIS — K5730 Diverticulosis of large intestine without perforation or abscess without bleeding: Secondary | ICD-10-CM | POA: Diagnosis not present

## 2022-06-29 DIAGNOSIS — E1122 Type 2 diabetes mellitus with diabetic chronic kidney disease: Secondary | ICD-10-CM | POA: Diagnosis not present

## 2022-07-01 DIAGNOSIS — Z89412 Acquired absence of left great toe: Secondary | ICD-10-CM | POA: Diagnosis not present

## 2022-07-01 DIAGNOSIS — Z89411 Acquired absence of right great toe: Secondary | ICD-10-CM | POA: Diagnosis not present

## 2022-07-01 DIAGNOSIS — I129 Hypertensive chronic kidney disease with stage 1 through stage 4 chronic kidney disease, or unspecified chronic kidney disease: Secondary | ICD-10-CM | POA: Diagnosis not present

## 2022-07-01 DIAGNOSIS — Z4781 Encounter for orthopedic aftercare following surgical amputation: Secondary | ICD-10-CM | POA: Diagnosis not present

## 2022-07-01 DIAGNOSIS — K5730 Diverticulosis of large intestine without perforation or abscess without bleeding: Secondary | ICD-10-CM | POA: Diagnosis not present

## 2022-07-01 DIAGNOSIS — E1122 Type 2 diabetes mellitus with diabetic chronic kidney disease: Secondary | ICD-10-CM | POA: Diagnosis not present

## 2022-07-04 DIAGNOSIS — I129 Hypertensive chronic kidney disease with stage 1 through stage 4 chronic kidney disease, or unspecified chronic kidney disease: Secondary | ICD-10-CM | POA: Diagnosis not present

## 2022-07-04 DIAGNOSIS — K5730 Diverticulosis of large intestine without perforation or abscess without bleeding: Secondary | ICD-10-CM | POA: Diagnosis not present

## 2022-07-04 DIAGNOSIS — Z4781 Encounter for orthopedic aftercare following surgical amputation: Secondary | ICD-10-CM | POA: Diagnosis not present

## 2022-07-04 DIAGNOSIS — Z89412 Acquired absence of left great toe: Secondary | ICD-10-CM | POA: Diagnosis not present

## 2022-07-04 DIAGNOSIS — Z89411 Acquired absence of right great toe: Secondary | ICD-10-CM | POA: Diagnosis not present

## 2022-07-04 DIAGNOSIS — E1122 Type 2 diabetes mellitus with diabetic chronic kidney disease: Secondary | ICD-10-CM | POA: Diagnosis not present

## 2022-07-05 DIAGNOSIS — Z89412 Acquired absence of left great toe: Secondary | ICD-10-CM | POA: Diagnosis not present

## 2022-07-05 DIAGNOSIS — Z4781 Encounter for orthopedic aftercare following surgical amputation: Secondary | ICD-10-CM | POA: Diagnosis not present

## 2022-07-05 DIAGNOSIS — K5730 Diverticulosis of large intestine without perforation or abscess without bleeding: Secondary | ICD-10-CM | POA: Diagnosis not present

## 2022-07-05 DIAGNOSIS — I129 Hypertensive chronic kidney disease with stage 1 through stage 4 chronic kidney disease, or unspecified chronic kidney disease: Secondary | ICD-10-CM | POA: Diagnosis not present

## 2022-07-05 DIAGNOSIS — Z89411 Acquired absence of right great toe: Secondary | ICD-10-CM | POA: Diagnosis not present

## 2022-07-05 DIAGNOSIS — E1122 Type 2 diabetes mellitus with diabetic chronic kidney disease: Secondary | ICD-10-CM | POA: Diagnosis not present

## 2022-07-06 DIAGNOSIS — Z89439 Acquired absence of unspecified foot: Secondary | ICD-10-CM | POA: Diagnosis not present

## 2022-07-06 DIAGNOSIS — I739 Peripheral vascular disease, unspecified: Secondary | ICD-10-CM | POA: Diagnosis not present

## 2022-07-06 DIAGNOSIS — E11628 Type 2 diabetes mellitus with other skin complications: Secondary | ICD-10-CM | POA: Diagnosis not present

## 2022-07-06 DIAGNOSIS — L089 Local infection of the skin and subcutaneous tissue, unspecified: Secondary | ICD-10-CM | POA: Diagnosis not present

## 2022-07-08 DIAGNOSIS — I129 Hypertensive chronic kidney disease with stage 1 through stage 4 chronic kidney disease, or unspecified chronic kidney disease: Secondary | ICD-10-CM | POA: Diagnosis not present

## 2022-07-08 DIAGNOSIS — Z89412 Acquired absence of left great toe: Secondary | ICD-10-CM | POA: Diagnosis not present

## 2022-07-08 DIAGNOSIS — E1122 Type 2 diabetes mellitus with diabetic chronic kidney disease: Secondary | ICD-10-CM | POA: Diagnosis not present

## 2022-07-08 DIAGNOSIS — Z4781 Encounter for orthopedic aftercare following surgical amputation: Secondary | ICD-10-CM | POA: Diagnosis not present

## 2022-07-08 DIAGNOSIS — Z89411 Acquired absence of right great toe: Secondary | ICD-10-CM | POA: Diagnosis not present

## 2022-07-08 DIAGNOSIS — K5730 Diverticulosis of large intestine without perforation or abscess without bleeding: Secondary | ICD-10-CM | POA: Diagnosis not present

## 2022-07-11 DIAGNOSIS — K5730 Diverticulosis of large intestine without perforation or abscess without bleeding: Secondary | ICD-10-CM | POA: Diagnosis not present

## 2022-07-11 DIAGNOSIS — I129 Hypertensive chronic kidney disease with stage 1 through stage 4 chronic kidney disease, or unspecified chronic kidney disease: Secondary | ICD-10-CM | POA: Diagnosis not present

## 2022-07-11 DIAGNOSIS — Z89411 Acquired absence of right great toe: Secondary | ICD-10-CM | POA: Diagnosis not present

## 2022-07-11 DIAGNOSIS — Z89412 Acquired absence of left great toe: Secondary | ICD-10-CM | POA: Diagnosis not present

## 2022-07-11 DIAGNOSIS — E1122 Type 2 diabetes mellitus with diabetic chronic kidney disease: Secondary | ICD-10-CM | POA: Diagnosis not present

## 2022-07-11 DIAGNOSIS — Z4781 Encounter for orthopedic aftercare following surgical amputation: Secondary | ICD-10-CM | POA: Diagnosis not present

## 2022-07-13 DIAGNOSIS — Z4781 Encounter for orthopedic aftercare following surgical amputation: Secondary | ICD-10-CM | POA: Diagnosis not present

## 2022-07-13 DIAGNOSIS — Z89411 Acquired absence of right great toe: Secondary | ICD-10-CM | POA: Diagnosis not present

## 2022-07-13 DIAGNOSIS — E1122 Type 2 diabetes mellitus with diabetic chronic kidney disease: Secondary | ICD-10-CM | POA: Diagnosis not present

## 2022-07-13 DIAGNOSIS — Z89412 Acquired absence of left great toe: Secondary | ICD-10-CM | POA: Diagnosis not present

## 2022-07-13 DIAGNOSIS — K5730 Diverticulosis of large intestine without perforation or abscess without bleeding: Secondary | ICD-10-CM | POA: Diagnosis not present

## 2022-07-13 DIAGNOSIS — I129 Hypertensive chronic kidney disease with stage 1 through stage 4 chronic kidney disease, or unspecified chronic kidney disease: Secondary | ICD-10-CM | POA: Diagnosis not present

## 2022-07-14 DIAGNOSIS — K5730 Diverticulosis of large intestine without perforation or abscess without bleeding: Secondary | ICD-10-CM | POA: Diagnosis not present

## 2022-07-14 DIAGNOSIS — I129 Hypertensive chronic kidney disease with stage 1 through stage 4 chronic kidney disease, or unspecified chronic kidney disease: Secondary | ICD-10-CM | POA: Diagnosis not present

## 2022-07-14 DIAGNOSIS — Z89412 Acquired absence of left great toe: Secondary | ICD-10-CM | POA: Diagnosis not present

## 2022-07-14 DIAGNOSIS — E1122 Type 2 diabetes mellitus with diabetic chronic kidney disease: Secondary | ICD-10-CM | POA: Diagnosis not present

## 2022-07-14 DIAGNOSIS — Z4781 Encounter for orthopedic aftercare following surgical amputation: Secondary | ICD-10-CM | POA: Diagnosis not present

## 2022-07-14 DIAGNOSIS — Z89411 Acquired absence of right great toe: Secondary | ICD-10-CM | POA: Diagnosis not present

## 2022-07-15 DIAGNOSIS — Z4781 Encounter for orthopedic aftercare following surgical amputation: Secondary | ICD-10-CM | POA: Diagnosis not present

## 2022-07-15 DIAGNOSIS — Z89412 Acquired absence of left great toe: Secondary | ICD-10-CM | POA: Diagnosis not present

## 2022-07-15 DIAGNOSIS — E1122 Type 2 diabetes mellitus with diabetic chronic kidney disease: Secondary | ICD-10-CM | POA: Diagnosis not present

## 2022-07-15 DIAGNOSIS — I129 Hypertensive chronic kidney disease with stage 1 through stage 4 chronic kidney disease, or unspecified chronic kidney disease: Secondary | ICD-10-CM | POA: Diagnosis not present

## 2022-07-15 DIAGNOSIS — K5730 Diverticulosis of large intestine without perforation or abscess without bleeding: Secondary | ICD-10-CM | POA: Diagnosis not present

## 2022-07-15 DIAGNOSIS — Z89411 Acquired absence of right great toe: Secondary | ICD-10-CM | POA: Diagnosis not present

## 2022-07-18 DIAGNOSIS — Z89412 Acquired absence of left great toe: Secondary | ICD-10-CM | POA: Diagnosis not present

## 2022-07-18 DIAGNOSIS — I129 Hypertensive chronic kidney disease with stage 1 through stage 4 chronic kidney disease, or unspecified chronic kidney disease: Secondary | ICD-10-CM | POA: Diagnosis not present

## 2022-07-18 DIAGNOSIS — Z4781 Encounter for orthopedic aftercare following surgical amputation: Secondary | ICD-10-CM | POA: Diagnosis not present

## 2022-07-18 DIAGNOSIS — E1122 Type 2 diabetes mellitus with diabetic chronic kidney disease: Secondary | ICD-10-CM | POA: Diagnosis not present

## 2022-07-18 DIAGNOSIS — Z89411 Acquired absence of right great toe: Secondary | ICD-10-CM | POA: Diagnosis not present

## 2022-07-18 DIAGNOSIS — K5730 Diverticulosis of large intestine without perforation or abscess without bleeding: Secondary | ICD-10-CM | POA: Diagnosis not present

## 2022-07-19 DIAGNOSIS — Z89412 Acquired absence of left great toe: Secondary | ICD-10-CM | POA: Diagnosis not present

## 2022-07-19 DIAGNOSIS — K5730 Diverticulosis of large intestine without perforation or abscess without bleeding: Secondary | ICD-10-CM | POA: Diagnosis not present

## 2022-07-19 DIAGNOSIS — Z89411 Acquired absence of right great toe: Secondary | ICD-10-CM | POA: Diagnosis not present

## 2022-07-19 DIAGNOSIS — I129 Hypertensive chronic kidney disease with stage 1 through stage 4 chronic kidney disease, or unspecified chronic kidney disease: Secondary | ICD-10-CM | POA: Diagnosis not present

## 2022-07-19 DIAGNOSIS — Z4781 Encounter for orthopedic aftercare following surgical amputation: Secondary | ICD-10-CM | POA: Diagnosis not present

## 2022-07-19 DIAGNOSIS — E1122 Type 2 diabetes mellitus with diabetic chronic kidney disease: Secondary | ICD-10-CM | POA: Diagnosis not present

## 2022-07-20 DIAGNOSIS — Z89412 Acquired absence of left great toe: Secondary | ICD-10-CM | POA: Diagnosis not present

## 2022-07-20 DIAGNOSIS — I129 Hypertensive chronic kidney disease with stage 1 through stage 4 chronic kidney disease, or unspecified chronic kidney disease: Secondary | ICD-10-CM | POA: Diagnosis not present

## 2022-07-20 DIAGNOSIS — Z89411 Acquired absence of right great toe: Secondary | ICD-10-CM | POA: Diagnosis not present

## 2022-07-20 DIAGNOSIS — E1122 Type 2 diabetes mellitus with diabetic chronic kidney disease: Secondary | ICD-10-CM | POA: Diagnosis not present

## 2022-07-20 DIAGNOSIS — K5730 Diverticulosis of large intestine without perforation or abscess without bleeding: Secondary | ICD-10-CM | POA: Diagnosis not present

## 2022-07-20 DIAGNOSIS — Z4781 Encounter for orthopedic aftercare following surgical amputation: Secondary | ICD-10-CM | POA: Diagnosis not present

## 2022-07-21 DIAGNOSIS — N1831 Chronic kidney disease, stage 3a: Secondary | ICD-10-CM | POA: Diagnosis not present

## 2022-07-21 DIAGNOSIS — H905 Unspecified sensorineural hearing loss: Secondary | ICD-10-CM | POA: Diagnosis not present

## 2022-07-21 DIAGNOSIS — I700 Atherosclerosis of aorta: Secondary | ICD-10-CM | POA: Diagnosis not present

## 2022-07-21 DIAGNOSIS — E1140 Type 2 diabetes mellitus with diabetic neuropathy, unspecified: Secondary | ICD-10-CM | POA: Diagnosis not present

## 2022-07-21 DIAGNOSIS — E785 Hyperlipidemia, unspecified: Secondary | ICD-10-CM | POA: Diagnosis not present

## 2022-07-21 DIAGNOSIS — D631 Anemia in chronic kidney disease: Secondary | ICD-10-CM | POA: Diagnosis not present

## 2022-07-21 DIAGNOSIS — I129 Hypertensive chronic kidney disease with stage 1 through stage 4 chronic kidney disease, or unspecified chronic kidney disease: Secondary | ICD-10-CM | POA: Diagnosis not present

## 2022-07-21 DIAGNOSIS — N401 Enlarged prostate with lower urinary tract symptoms: Secondary | ICD-10-CM | POA: Diagnosis not present

## 2022-07-21 DIAGNOSIS — I951 Orthostatic hypotension: Secondary | ICD-10-CM | POA: Diagnosis not present

## 2022-07-21 DIAGNOSIS — Z89412 Acquired absence of left great toe: Secondary | ICD-10-CM | POA: Diagnosis not present

## 2022-07-21 DIAGNOSIS — K5730 Diverticulosis of large intestine without perforation or abscess without bleeding: Secondary | ICD-10-CM | POA: Diagnosis not present

## 2022-07-21 DIAGNOSIS — I480 Paroxysmal atrial fibrillation: Secondary | ICD-10-CM | POA: Diagnosis not present

## 2022-07-21 DIAGNOSIS — Z8701 Personal history of pneumonia (recurrent): Secondary | ICD-10-CM | POA: Diagnosis not present

## 2022-07-21 DIAGNOSIS — Z89422 Acquired absence of other left toe(s): Secondary | ICD-10-CM | POA: Diagnosis not present

## 2022-07-21 DIAGNOSIS — R338 Other retention of urine: Secondary | ICD-10-CM | POA: Diagnosis not present

## 2022-07-21 DIAGNOSIS — E1122 Type 2 diabetes mellitus with diabetic chronic kidney disease: Secondary | ICD-10-CM | POA: Diagnosis not present

## 2022-07-21 DIAGNOSIS — Z8546 Personal history of malignant neoplasm of prostate: Secondary | ICD-10-CM | POA: Diagnosis not present

## 2022-07-21 DIAGNOSIS — Z4801 Encounter for change or removal of surgical wound dressing: Secondary | ICD-10-CM | POA: Diagnosis not present

## 2022-07-21 DIAGNOSIS — Z89411 Acquired absence of right great toe: Secondary | ICD-10-CM | POA: Diagnosis not present

## 2022-07-21 DIAGNOSIS — F419 Anxiety disorder, unspecified: Secondary | ICD-10-CM | POA: Diagnosis not present

## 2022-07-21 DIAGNOSIS — Z4781 Encounter for orthopedic aftercare following surgical amputation: Secondary | ICD-10-CM | POA: Diagnosis not present

## 2022-07-21 DIAGNOSIS — E1169 Type 2 diabetes mellitus with other specified complication: Secondary | ICD-10-CM | POA: Diagnosis not present

## 2022-07-21 DIAGNOSIS — F329 Major depressive disorder, single episode, unspecified: Secondary | ICD-10-CM | POA: Diagnosis not present

## 2022-07-21 DIAGNOSIS — Z8616 Personal history of COVID-19: Secondary | ICD-10-CM | POA: Diagnosis not present

## 2022-07-21 DIAGNOSIS — M86172 Other acute osteomyelitis, left ankle and foot: Secondary | ICD-10-CM | POA: Diagnosis not present

## 2022-07-22 DIAGNOSIS — Z4801 Encounter for change or removal of surgical wound dressing: Secondary | ICD-10-CM | POA: Diagnosis not present

## 2022-07-22 DIAGNOSIS — E1169 Type 2 diabetes mellitus with other specified complication: Secondary | ICD-10-CM | POA: Diagnosis not present

## 2022-07-22 DIAGNOSIS — M86172 Other acute osteomyelitis, left ankle and foot: Secondary | ICD-10-CM | POA: Diagnosis not present

## 2022-07-22 DIAGNOSIS — Z89412 Acquired absence of left great toe: Secondary | ICD-10-CM | POA: Diagnosis not present

## 2022-07-22 DIAGNOSIS — Z89422 Acquired absence of other left toe(s): Secondary | ICD-10-CM | POA: Diagnosis not present

## 2022-07-22 DIAGNOSIS — Z4781 Encounter for orthopedic aftercare following surgical amputation: Secondary | ICD-10-CM | POA: Diagnosis not present

## 2022-07-25 DIAGNOSIS — E1169 Type 2 diabetes mellitus with other specified complication: Secondary | ICD-10-CM | POA: Diagnosis not present

## 2022-07-25 DIAGNOSIS — Z89412 Acquired absence of left great toe: Secondary | ICD-10-CM | POA: Diagnosis not present

## 2022-07-25 DIAGNOSIS — Z89422 Acquired absence of other left toe(s): Secondary | ICD-10-CM | POA: Diagnosis not present

## 2022-07-25 DIAGNOSIS — Z4801 Encounter for change or removal of surgical wound dressing: Secondary | ICD-10-CM | POA: Diagnosis not present

## 2022-07-25 DIAGNOSIS — M86172 Other acute osteomyelitis, left ankle and foot: Secondary | ICD-10-CM | POA: Diagnosis not present

## 2022-07-25 DIAGNOSIS — Z4781 Encounter for orthopedic aftercare following surgical amputation: Secondary | ICD-10-CM | POA: Diagnosis not present

## 2022-07-26 DIAGNOSIS — Z4801 Encounter for change or removal of surgical wound dressing: Secondary | ICD-10-CM | POA: Diagnosis not present

## 2022-07-26 DIAGNOSIS — Z89422 Acquired absence of other left toe(s): Secondary | ICD-10-CM | POA: Diagnosis not present

## 2022-07-26 DIAGNOSIS — Z4781 Encounter for orthopedic aftercare following surgical amputation: Secondary | ICD-10-CM | POA: Diagnosis not present

## 2022-07-26 DIAGNOSIS — Z89412 Acquired absence of left great toe: Secondary | ICD-10-CM | POA: Diagnosis not present

## 2022-07-26 DIAGNOSIS — M86172 Other acute osteomyelitis, left ankle and foot: Secondary | ICD-10-CM | POA: Diagnosis not present

## 2022-07-26 DIAGNOSIS — E1169 Type 2 diabetes mellitus with other specified complication: Secondary | ICD-10-CM | POA: Diagnosis not present

## 2022-07-27 DIAGNOSIS — E1169 Type 2 diabetes mellitus with other specified complication: Secondary | ICD-10-CM | POA: Diagnosis not present

## 2022-07-27 DIAGNOSIS — Z89412 Acquired absence of left great toe: Secondary | ICD-10-CM | POA: Diagnosis not present

## 2022-07-27 DIAGNOSIS — Z4801 Encounter for change or removal of surgical wound dressing: Secondary | ICD-10-CM | POA: Diagnosis not present

## 2022-07-27 DIAGNOSIS — Z4781 Encounter for orthopedic aftercare following surgical amputation: Secondary | ICD-10-CM | POA: Diagnosis not present

## 2022-07-27 DIAGNOSIS — M86172 Other acute osteomyelitis, left ankle and foot: Secondary | ICD-10-CM | POA: Diagnosis not present

## 2022-07-27 DIAGNOSIS — Z89422 Acquired absence of other left toe(s): Secondary | ICD-10-CM | POA: Diagnosis not present

## 2022-07-29 DIAGNOSIS — M86172 Other acute osteomyelitis, left ankle and foot: Secondary | ICD-10-CM | POA: Diagnosis not present

## 2022-07-29 DIAGNOSIS — Z89412 Acquired absence of left great toe: Secondary | ICD-10-CM | POA: Diagnosis not present

## 2022-07-29 DIAGNOSIS — E1169 Type 2 diabetes mellitus with other specified complication: Secondary | ICD-10-CM | POA: Diagnosis not present

## 2022-07-29 DIAGNOSIS — Z89422 Acquired absence of other left toe(s): Secondary | ICD-10-CM | POA: Diagnosis not present

## 2022-07-29 DIAGNOSIS — Z4781 Encounter for orthopedic aftercare following surgical amputation: Secondary | ICD-10-CM | POA: Diagnosis not present

## 2022-07-29 DIAGNOSIS — Z4801 Encounter for change or removal of surgical wound dressing: Secondary | ICD-10-CM | POA: Diagnosis not present

## 2022-08-01 DIAGNOSIS — M86172 Other acute osteomyelitis, left ankle and foot: Secondary | ICD-10-CM | POA: Diagnosis not present

## 2022-08-01 DIAGNOSIS — Z89412 Acquired absence of left great toe: Secondary | ICD-10-CM | POA: Diagnosis not present

## 2022-08-01 DIAGNOSIS — Z89422 Acquired absence of other left toe(s): Secondary | ICD-10-CM | POA: Diagnosis not present

## 2022-08-01 DIAGNOSIS — E1169 Type 2 diabetes mellitus with other specified complication: Secondary | ICD-10-CM | POA: Diagnosis not present

## 2022-08-01 DIAGNOSIS — Z4801 Encounter for change or removal of surgical wound dressing: Secondary | ICD-10-CM | POA: Diagnosis not present

## 2022-08-01 DIAGNOSIS — Z4781 Encounter for orthopedic aftercare following surgical amputation: Secondary | ICD-10-CM | POA: Diagnosis not present

## 2022-08-02 DIAGNOSIS — E11628 Type 2 diabetes mellitus with other skin complications: Secondary | ICD-10-CM | POA: Diagnosis not present

## 2022-08-02 DIAGNOSIS — I739 Peripheral vascular disease, unspecified: Secondary | ICD-10-CM | POA: Diagnosis not present

## 2022-08-02 DIAGNOSIS — L089 Local infection of the skin and subcutaneous tissue, unspecified: Secondary | ICD-10-CM | POA: Diagnosis not present

## 2022-08-03 DIAGNOSIS — Z89422 Acquired absence of other left toe(s): Secondary | ICD-10-CM | POA: Diagnosis not present

## 2022-08-03 DIAGNOSIS — Z4781 Encounter for orthopedic aftercare following surgical amputation: Secondary | ICD-10-CM | POA: Diagnosis not present

## 2022-08-03 DIAGNOSIS — E1169 Type 2 diabetes mellitus with other specified complication: Secondary | ICD-10-CM | POA: Diagnosis not present

## 2022-08-03 DIAGNOSIS — M86172 Other acute osteomyelitis, left ankle and foot: Secondary | ICD-10-CM | POA: Diagnosis not present

## 2022-08-03 DIAGNOSIS — Z4801 Encounter for change or removal of surgical wound dressing: Secondary | ICD-10-CM | POA: Diagnosis not present

## 2022-08-03 DIAGNOSIS — Z89412 Acquired absence of left great toe: Secondary | ICD-10-CM | POA: Diagnosis not present

## 2022-08-04 DIAGNOSIS — M86172 Other acute osteomyelitis, left ankle and foot: Secondary | ICD-10-CM | POA: Diagnosis not present

## 2022-08-04 DIAGNOSIS — Z89412 Acquired absence of left great toe: Secondary | ICD-10-CM | POA: Diagnosis not present

## 2022-08-04 DIAGNOSIS — Z89422 Acquired absence of other left toe(s): Secondary | ICD-10-CM | POA: Diagnosis not present

## 2022-08-04 DIAGNOSIS — Z4781 Encounter for orthopedic aftercare following surgical amputation: Secondary | ICD-10-CM | POA: Diagnosis not present

## 2022-08-04 DIAGNOSIS — Z4801 Encounter for change or removal of surgical wound dressing: Secondary | ICD-10-CM | POA: Diagnosis not present

## 2022-08-04 DIAGNOSIS — E1169 Type 2 diabetes mellitus with other specified complication: Secondary | ICD-10-CM | POA: Diagnosis not present

## 2022-08-05 DIAGNOSIS — Z4801 Encounter for change or removal of surgical wound dressing: Secondary | ICD-10-CM | POA: Diagnosis not present

## 2022-08-05 DIAGNOSIS — Z4781 Encounter for orthopedic aftercare following surgical amputation: Secondary | ICD-10-CM | POA: Diagnosis not present

## 2022-08-05 DIAGNOSIS — E1169 Type 2 diabetes mellitus with other specified complication: Secondary | ICD-10-CM | POA: Diagnosis not present

## 2022-08-05 DIAGNOSIS — Z89412 Acquired absence of left great toe: Secondary | ICD-10-CM | POA: Diagnosis not present

## 2022-08-05 DIAGNOSIS — M86172 Other acute osteomyelitis, left ankle and foot: Secondary | ICD-10-CM | POA: Diagnosis not present

## 2022-08-05 DIAGNOSIS — Z89422 Acquired absence of other left toe(s): Secondary | ICD-10-CM | POA: Diagnosis not present

## 2022-08-08 DIAGNOSIS — E1169 Type 2 diabetes mellitus with other specified complication: Secondary | ICD-10-CM | POA: Diagnosis not present

## 2022-08-08 DIAGNOSIS — M86172 Other acute osteomyelitis, left ankle and foot: Secondary | ICD-10-CM | POA: Diagnosis not present

## 2022-08-08 DIAGNOSIS — Z89422 Acquired absence of other left toe(s): Secondary | ICD-10-CM | POA: Diagnosis not present

## 2022-08-08 DIAGNOSIS — Z4801 Encounter for change or removal of surgical wound dressing: Secondary | ICD-10-CM | POA: Diagnosis not present

## 2022-08-08 DIAGNOSIS — Z4781 Encounter for orthopedic aftercare following surgical amputation: Secondary | ICD-10-CM | POA: Diagnosis not present

## 2022-08-08 DIAGNOSIS — Z89412 Acquired absence of left great toe: Secondary | ICD-10-CM | POA: Diagnosis not present

## 2022-08-09 DIAGNOSIS — Z4801 Encounter for change or removal of surgical wound dressing: Secondary | ICD-10-CM | POA: Diagnosis not present

## 2022-08-09 DIAGNOSIS — Z89412 Acquired absence of left great toe: Secondary | ICD-10-CM | POA: Diagnosis not present

## 2022-08-09 DIAGNOSIS — Z89422 Acquired absence of other left toe(s): Secondary | ICD-10-CM | POA: Diagnosis not present

## 2022-08-09 DIAGNOSIS — M86172 Other acute osteomyelitis, left ankle and foot: Secondary | ICD-10-CM | POA: Diagnosis not present

## 2022-08-09 DIAGNOSIS — Z4781 Encounter for orthopedic aftercare following surgical amputation: Secondary | ICD-10-CM | POA: Diagnosis not present

## 2022-08-09 DIAGNOSIS — E1169 Type 2 diabetes mellitus with other specified complication: Secondary | ICD-10-CM | POA: Diagnosis not present

## 2022-08-10 DIAGNOSIS — Z89412 Acquired absence of left great toe: Secondary | ICD-10-CM | POA: Diagnosis not present

## 2022-08-10 DIAGNOSIS — M86172 Other acute osteomyelitis, left ankle and foot: Secondary | ICD-10-CM | POA: Diagnosis not present

## 2022-08-10 DIAGNOSIS — E1169 Type 2 diabetes mellitus with other specified complication: Secondary | ICD-10-CM | POA: Diagnosis not present

## 2022-08-10 DIAGNOSIS — Z89422 Acquired absence of other left toe(s): Secondary | ICD-10-CM | POA: Diagnosis not present

## 2022-08-10 DIAGNOSIS — Z4781 Encounter for orthopedic aftercare following surgical amputation: Secondary | ICD-10-CM | POA: Diagnosis not present

## 2022-08-10 DIAGNOSIS — Z4801 Encounter for change or removal of surgical wound dressing: Secondary | ICD-10-CM | POA: Diagnosis not present

## 2022-08-12 DIAGNOSIS — Z89412 Acquired absence of left great toe: Secondary | ICD-10-CM | POA: Diagnosis not present

## 2022-08-12 DIAGNOSIS — M86172 Other acute osteomyelitis, left ankle and foot: Secondary | ICD-10-CM | POA: Diagnosis not present

## 2022-08-12 DIAGNOSIS — E1169 Type 2 diabetes mellitus with other specified complication: Secondary | ICD-10-CM | POA: Diagnosis not present

## 2022-08-12 DIAGNOSIS — Z4781 Encounter for orthopedic aftercare following surgical amputation: Secondary | ICD-10-CM | POA: Diagnosis not present

## 2022-08-12 DIAGNOSIS — Z89422 Acquired absence of other left toe(s): Secondary | ICD-10-CM | POA: Diagnosis not present

## 2022-08-12 DIAGNOSIS — Z4801 Encounter for change or removal of surgical wound dressing: Secondary | ICD-10-CM | POA: Diagnosis not present

## 2022-08-15 DIAGNOSIS — E1169 Type 2 diabetes mellitus with other specified complication: Secondary | ICD-10-CM | POA: Diagnosis not present

## 2022-08-15 DIAGNOSIS — Z4781 Encounter for orthopedic aftercare following surgical amputation: Secondary | ICD-10-CM | POA: Diagnosis not present

## 2022-08-15 DIAGNOSIS — Z4801 Encounter for change or removal of surgical wound dressing: Secondary | ICD-10-CM | POA: Diagnosis not present

## 2022-08-15 DIAGNOSIS — Z89422 Acquired absence of other left toe(s): Secondary | ICD-10-CM | POA: Diagnosis not present

## 2022-08-15 DIAGNOSIS — M86172 Other acute osteomyelitis, left ankle and foot: Secondary | ICD-10-CM | POA: Diagnosis not present

## 2022-08-15 DIAGNOSIS — Z89412 Acquired absence of left great toe: Secondary | ICD-10-CM | POA: Diagnosis not present

## 2022-08-17 DIAGNOSIS — Z89412 Acquired absence of left great toe: Secondary | ICD-10-CM | POA: Diagnosis not present

## 2022-08-17 DIAGNOSIS — M86172 Other acute osteomyelitis, left ankle and foot: Secondary | ICD-10-CM | POA: Diagnosis not present

## 2022-08-17 DIAGNOSIS — Z4801 Encounter for change or removal of surgical wound dressing: Secondary | ICD-10-CM | POA: Diagnosis not present

## 2022-08-17 DIAGNOSIS — E1169 Type 2 diabetes mellitus with other specified complication: Secondary | ICD-10-CM | POA: Diagnosis not present

## 2022-08-17 DIAGNOSIS — Z89422 Acquired absence of other left toe(s): Secondary | ICD-10-CM | POA: Diagnosis not present

## 2022-08-17 DIAGNOSIS — Z4781 Encounter for orthopedic aftercare following surgical amputation: Secondary | ICD-10-CM | POA: Diagnosis not present

## 2022-08-19 DIAGNOSIS — Z89422 Acquired absence of other left toe(s): Secondary | ICD-10-CM | POA: Diagnosis not present

## 2022-08-19 DIAGNOSIS — Z4801 Encounter for change or removal of surgical wound dressing: Secondary | ICD-10-CM | POA: Diagnosis not present

## 2022-08-19 DIAGNOSIS — Z89412 Acquired absence of left great toe: Secondary | ICD-10-CM | POA: Diagnosis not present

## 2022-08-19 DIAGNOSIS — M86172 Other acute osteomyelitis, left ankle and foot: Secondary | ICD-10-CM | POA: Diagnosis not present

## 2022-08-19 DIAGNOSIS — E1169 Type 2 diabetes mellitus with other specified complication: Secondary | ICD-10-CM | POA: Diagnosis not present

## 2022-08-19 DIAGNOSIS — Z4781 Encounter for orthopedic aftercare following surgical amputation: Secondary | ICD-10-CM | POA: Diagnosis not present

## 2022-08-20 DIAGNOSIS — Z89411 Acquired absence of right great toe: Secondary | ICD-10-CM | POA: Diagnosis not present

## 2022-08-20 DIAGNOSIS — N401 Enlarged prostate with lower urinary tract symptoms: Secondary | ICD-10-CM | POA: Diagnosis not present

## 2022-08-20 DIAGNOSIS — D631 Anemia in chronic kidney disease: Secondary | ICD-10-CM | POA: Diagnosis not present

## 2022-08-20 DIAGNOSIS — F419 Anxiety disorder, unspecified: Secondary | ICD-10-CM | POA: Diagnosis not present

## 2022-08-20 DIAGNOSIS — K5730 Diverticulosis of large intestine without perforation or abscess without bleeding: Secondary | ICD-10-CM | POA: Diagnosis not present

## 2022-08-20 DIAGNOSIS — M86172 Other acute osteomyelitis, left ankle and foot: Secondary | ICD-10-CM | POA: Diagnosis not present

## 2022-08-20 DIAGNOSIS — Z89422 Acquired absence of other left toe(s): Secondary | ICD-10-CM | POA: Diagnosis not present

## 2022-08-20 DIAGNOSIS — R338 Other retention of urine: Secondary | ICD-10-CM | POA: Diagnosis not present

## 2022-08-20 DIAGNOSIS — E1140 Type 2 diabetes mellitus with diabetic neuropathy, unspecified: Secondary | ICD-10-CM | POA: Diagnosis not present

## 2022-08-20 DIAGNOSIS — Z89412 Acquired absence of left great toe: Secondary | ICD-10-CM | POA: Diagnosis not present

## 2022-08-20 DIAGNOSIS — E1122 Type 2 diabetes mellitus with diabetic chronic kidney disease: Secondary | ICD-10-CM | POA: Diagnosis not present

## 2022-08-20 DIAGNOSIS — Z8616 Personal history of COVID-19: Secondary | ICD-10-CM | POA: Diagnosis not present

## 2022-08-20 DIAGNOSIS — Z4781 Encounter for orthopedic aftercare following surgical amputation: Secondary | ICD-10-CM | POA: Diagnosis not present

## 2022-08-20 DIAGNOSIS — Z8701 Personal history of pneumonia (recurrent): Secondary | ICD-10-CM | POA: Diagnosis not present

## 2022-08-20 DIAGNOSIS — I480 Paroxysmal atrial fibrillation: Secondary | ICD-10-CM | POA: Diagnosis not present

## 2022-08-20 DIAGNOSIS — I951 Orthostatic hypotension: Secondary | ICD-10-CM | POA: Diagnosis not present

## 2022-08-20 DIAGNOSIS — I700 Atherosclerosis of aorta: Secondary | ICD-10-CM | POA: Diagnosis not present

## 2022-08-20 DIAGNOSIS — N1831 Chronic kidney disease, stage 3a: Secondary | ICD-10-CM | POA: Diagnosis not present

## 2022-08-20 DIAGNOSIS — H905 Unspecified sensorineural hearing loss: Secondary | ICD-10-CM | POA: Diagnosis not present

## 2022-08-20 DIAGNOSIS — I129 Hypertensive chronic kidney disease with stage 1 through stage 4 chronic kidney disease, or unspecified chronic kidney disease: Secondary | ICD-10-CM | POA: Diagnosis not present

## 2022-08-20 DIAGNOSIS — E1169 Type 2 diabetes mellitus with other specified complication: Secondary | ICD-10-CM | POA: Diagnosis not present

## 2022-08-20 DIAGNOSIS — F329 Major depressive disorder, single episode, unspecified: Secondary | ICD-10-CM | POA: Diagnosis not present

## 2022-08-20 DIAGNOSIS — Z8546 Personal history of malignant neoplasm of prostate: Secondary | ICD-10-CM | POA: Diagnosis not present

## 2022-08-20 DIAGNOSIS — E785 Hyperlipidemia, unspecified: Secondary | ICD-10-CM | POA: Diagnosis not present

## 2022-08-20 DIAGNOSIS — Z4801 Encounter for change or removal of surgical wound dressing: Secondary | ICD-10-CM | POA: Diagnosis not present

## 2022-08-22 DIAGNOSIS — Z4801 Encounter for change or removal of surgical wound dressing: Secondary | ICD-10-CM | POA: Diagnosis not present

## 2022-08-22 DIAGNOSIS — Z4781 Encounter for orthopedic aftercare following surgical amputation: Secondary | ICD-10-CM | POA: Diagnosis not present

## 2022-08-22 DIAGNOSIS — E1169 Type 2 diabetes mellitus with other specified complication: Secondary | ICD-10-CM | POA: Diagnosis not present

## 2022-08-22 DIAGNOSIS — Z89412 Acquired absence of left great toe: Secondary | ICD-10-CM | POA: Diagnosis not present

## 2022-08-22 DIAGNOSIS — Z89422 Acquired absence of other left toe(s): Secondary | ICD-10-CM | POA: Diagnosis not present

## 2022-08-22 DIAGNOSIS — M86172 Other acute osteomyelitis, left ankle and foot: Secondary | ICD-10-CM | POA: Diagnosis not present

## 2022-08-24 DIAGNOSIS — Z4801 Encounter for change or removal of surgical wound dressing: Secondary | ICD-10-CM | POA: Diagnosis not present

## 2022-08-24 DIAGNOSIS — Z89422 Acquired absence of other left toe(s): Secondary | ICD-10-CM | POA: Diagnosis not present

## 2022-08-24 DIAGNOSIS — M86172 Other acute osteomyelitis, left ankle and foot: Secondary | ICD-10-CM | POA: Diagnosis not present

## 2022-08-24 DIAGNOSIS — Z4781 Encounter for orthopedic aftercare following surgical amputation: Secondary | ICD-10-CM | POA: Diagnosis not present

## 2022-08-24 DIAGNOSIS — E1169 Type 2 diabetes mellitus with other specified complication: Secondary | ICD-10-CM | POA: Diagnosis not present

## 2022-08-24 DIAGNOSIS — Z89412 Acquired absence of left great toe: Secondary | ICD-10-CM | POA: Diagnosis not present

## 2022-08-26 DIAGNOSIS — Z89422 Acquired absence of other left toe(s): Secondary | ICD-10-CM | POA: Diagnosis not present

## 2022-08-26 DIAGNOSIS — Z4781 Encounter for orthopedic aftercare following surgical amputation: Secondary | ICD-10-CM | POA: Diagnosis not present

## 2022-08-26 DIAGNOSIS — Z89412 Acquired absence of left great toe: Secondary | ICD-10-CM | POA: Diagnosis not present

## 2022-08-26 DIAGNOSIS — Z4801 Encounter for change or removal of surgical wound dressing: Secondary | ICD-10-CM | POA: Diagnosis not present

## 2022-08-26 DIAGNOSIS — E1169 Type 2 diabetes mellitus with other specified complication: Secondary | ICD-10-CM | POA: Diagnosis not present

## 2022-08-26 DIAGNOSIS — M86172 Other acute osteomyelitis, left ankle and foot: Secondary | ICD-10-CM | POA: Diagnosis not present

## 2022-08-29 DIAGNOSIS — E1169 Type 2 diabetes mellitus with other specified complication: Secondary | ICD-10-CM | POA: Diagnosis not present

## 2022-08-29 DIAGNOSIS — Z4781 Encounter for orthopedic aftercare following surgical amputation: Secondary | ICD-10-CM | POA: Diagnosis not present

## 2022-08-29 DIAGNOSIS — Z89412 Acquired absence of left great toe: Secondary | ICD-10-CM | POA: Diagnosis not present

## 2022-08-29 DIAGNOSIS — Z89422 Acquired absence of other left toe(s): Secondary | ICD-10-CM | POA: Diagnosis not present

## 2022-08-29 DIAGNOSIS — M86172 Other acute osteomyelitis, left ankle and foot: Secondary | ICD-10-CM | POA: Diagnosis not present

## 2022-08-29 DIAGNOSIS — Z4801 Encounter for change or removal of surgical wound dressing: Secondary | ICD-10-CM | POA: Diagnosis not present

## 2022-08-30 DIAGNOSIS — Z45321 Encounter for adjustment and management of cochlear device: Secondary | ICD-10-CM | POA: Diagnosis not present

## 2022-08-30 DIAGNOSIS — H903 Sensorineural hearing loss, bilateral: Secondary | ICD-10-CM | POA: Diagnosis not present

## 2022-09-01 DIAGNOSIS — Z89412 Acquired absence of left great toe: Secondary | ICD-10-CM | POA: Diagnosis not present

## 2022-09-01 DIAGNOSIS — Z4781 Encounter for orthopedic aftercare following surgical amputation: Secondary | ICD-10-CM | POA: Diagnosis not present

## 2022-09-01 DIAGNOSIS — E1169 Type 2 diabetes mellitus with other specified complication: Secondary | ICD-10-CM | POA: Diagnosis not present

## 2022-09-01 DIAGNOSIS — Z4801 Encounter for change or removal of surgical wound dressing: Secondary | ICD-10-CM | POA: Diagnosis not present

## 2022-09-01 DIAGNOSIS — Z89422 Acquired absence of other left toe(s): Secondary | ICD-10-CM | POA: Diagnosis not present

## 2022-09-01 DIAGNOSIS — M86172 Other acute osteomyelitis, left ankle and foot: Secondary | ICD-10-CM | POA: Diagnosis not present

## 2022-09-07 DIAGNOSIS — Z4801 Encounter for change or removal of surgical wound dressing: Secondary | ICD-10-CM | POA: Diagnosis not present

## 2022-09-07 DIAGNOSIS — M86172 Other acute osteomyelitis, left ankle and foot: Secondary | ICD-10-CM | POA: Diagnosis not present

## 2022-09-07 DIAGNOSIS — Z4781 Encounter for orthopedic aftercare following surgical amputation: Secondary | ICD-10-CM | POA: Diagnosis not present

## 2022-09-07 DIAGNOSIS — Z89412 Acquired absence of left great toe: Secondary | ICD-10-CM | POA: Diagnosis not present

## 2022-09-07 DIAGNOSIS — E1169 Type 2 diabetes mellitus with other specified complication: Secondary | ICD-10-CM | POA: Diagnosis not present

## 2022-09-07 DIAGNOSIS — Z89422 Acquired absence of other left toe(s): Secondary | ICD-10-CM | POA: Diagnosis not present

## 2022-09-10 DIAGNOSIS — R739 Hyperglycemia, unspecified: Secondary | ICD-10-CM | POA: Diagnosis not present

## 2022-09-10 DIAGNOSIS — S61412A Laceration without foreign body of left hand, initial encounter: Secondary | ICD-10-CM | POA: Diagnosis not present

## 2022-09-10 DIAGNOSIS — M5030 Other cervical disc degeneration, unspecified cervical region: Secondary | ICD-10-CM | POA: Diagnosis not present

## 2022-09-10 DIAGNOSIS — Z8616 Personal history of COVID-19: Secondary | ICD-10-CM | POA: Diagnosis not present

## 2022-09-10 DIAGNOSIS — N281 Cyst of kidney, acquired: Secondary | ICD-10-CM | POA: Diagnosis not present

## 2022-09-10 DIAGNOSIS — Z87891 Personal history of nicotine dependence: Secondary | ICD-10-CM | POA: Diagnosis not present

## 2022-09-10 DIAGNOSIS — S3991XA Unspecified injury of abdomen, initial encounter: Secondary | ICD-10-CM | POA: Diagnosis not present

## 2022-09-10 DIAGNOSIS — E119 Type 2 diabetes mellitus without complications: Secondary | ICD-10-CM | POA: Diagnosis not present

## 2022-09-10 DIAGNOSIS — R001 Bradycardia, unspecified: Secondary | ICD-10-CM | POA: Diagnosis not present

## 2022-09-10 DIAGNOSIS — I10 Essential (primary) hypertension: Secondary | ICD-10-CM | POA: Diagnosis not present

## 2022-09-10 DIAGNOSIS — D1802 Hemangioma of intracranial structures: Secondary | ICD-10-CM | POA: Diagnosis not present

## 2022-09-12 DIAGNOSIS — E1169 Type 2 diabetes mellitus with other specified complication: Secondary | ICD-10-CM | POA: Diagnosis not present

## 2022-09-12 DIAGNOSIS — Z89412 Acquired absence of left great toe: Secondary | ICD-10-CM | POA: Diagnosis not present

## 2022-09-12 DIAGNOSIS — Z89422 Acquired absence of other left toe(s): Secondary | ICD-10-CM | POA: Diagnosis not present

## 2022-09-12 DIAGNOSIS — M86172 Other acute osteomyelitis, left ankle and foot: Secondary | ICD-10-CM | POA: Diagnosis not present

## 2022-09-12 DIAGNOSIS — Z4781 Encounter for orthopedic aftercare following surgical amputation: Secondary | ICD-10-CM | POA: Diagnosis not present

## 2022-09-12 DIAGNOSIS — Z4801 Encounter for change or removal of surgical wound dressing: Secondary | ICD-10-CM | POA: Diagnosis not present

## 2022-09-14 DIAGNOSIS — M86172 Other acute osteomyelitis, left ankle and foot: Secondary | ICD-10-CM | POA: Diagnosis not present

## 2022-09-14 DIAGNOSIS — Z89412 Acquired absence of left great toe: Secondary | ICD-10-CM | POA: Diagnosis not present

## 2022-09-14 DIAGNOSIS — Z4801 Encounter for change or removal of surgical wound dressing: Secondary | ICD-10-CM | POA: Diagnosis not present

## 2022-09-14 DIAGNOSIS — Z89422 Acquired absence of other left toe(s): Secondary | ICD-10-CM | POA: Diagnosis not present

## 2022-09-14 DIAGNOSIS — Z4781 Encounter for orthopedic aftercare following surgical amputation: Secondary | ICD-10-CM | POA: Diagnosis not present

## 2022-09-14 DIAGNOSIS — E1169 Type 2 diabetes mellitus with other specified complication: Secondary | ICD-10-CM | POA: Diagnosis not present

## 2022-09-15 DIAGNOSIS — Z89412 Acquired absence of left great toe: Secondary | ICD-10-CM | POA: Diagnosis not present

## 2022-09-15 DIAGNOSIS — Z4781 Encounter for orthopedic aftercare following surgical amputation: Secondary | ICD-10-CM | POA: Diagnosis not present

## 2022-09-15 DIAGNOSIS — M86172 Other acute osteomyelitis, left ankle and foot: Secondary | ICD-10-CM | POA: Diagnosis not present

## 2022-09-15 DIAGNOSIS — E1169 Type 2 diabetes mellitus with other specified complication: Secondary | ICD-10-CM | POA: Diagnosis not present

## 2022-09-15 DIAGNOSIS — Z4801 Encounter for change or removal of surgical wound dressing: Secondary | ICD-10-CM | POA: Diagnosis not present

## 2022-09-15 DIAGNOSIS — Z89422 Acquired absence of other left toe(s): Secondary | ICD-10-CM | POA: Diagnosis not present

## 2022-10-04 DIAGNOSIS — Z6825 Body mass index (BMI) 25.0-25.9, adult: Secondary | ICD-10-CM | POA: Diagnosis not present

## 2022-10-04 DIAGNOSIS — Z1331 Encounter for screening for depression: Secondary | ICD-10-CM | POA: Diagnosis not present

## 2022-10-04 DIAGNOSIS — R42 Dizziness and giddiness: Secondary | ICD-10-CM | POA: Diagnosis not present

## 2022-10-04 DIAGNOSIS — E1140 Type 2 diabetes mellitus with diabetic neuropathy, unspecified: Secondary | ICD-10-CM | POA: Diagnosis not present

## 2022-10-04 DIAGNOSIS — Z0000 Encounter for general adult medical examination without abnormal findings: Secondary | ICD-10-CM | POA: Diagnosis not present

## 2022-10-04 DIAGNOSIS — I10 Essential (primary) hypertension: Secondary | ICD-10-CM | POA: Diagnosis not present

## 2022-10-04 DIAGNOSIS — G4700 Insomnia, unspecified: Secondary | ICD-10-CM | POA: Diagnosis not present

## 2022-10-04 DIAGNOSIS — Z89412 Acquired absence of left great toe: Secondary | ICD-10-CM | POA: Diagnosis not present

## 2022-10-04 DIAGNOSIS — Z89411 Acquired absence of right great toe: Secondary | ICD-10-CM | POA: Diagnosis not present

## 2022-10-19 DIAGNOSIS — D509 Iron deficiency anemia, unspecified: Secondary | ICD-10-CM | POA: Diagnosis not present

## 2022-10-19 DIAGNOSIS — Z79899 Other long term (current) drug therapy: Secondary | ICD-10-CM | POA: Diagnosis not present

## 2022-10-19 DIAGNOSIS — E1140 Type 2 diabetes mellitus with diabetic neuropathy, unspecified: Secondary | ICD-10-CM | POA: Diagnosis not present

## 2022-11-02 DIAGNOSIS — Z89439 Acquired absence of unspecified foot: Secondary | ICD-10-CM | POA: Diagnosis not present

## 2022-11-02 DIAGNOSIS — L97511 Non-pressure chronic ulcer of other part of right foot limited to breakdown of skin: Secondary | ICD-10-CM | POA: Diagnosis not present

## 2022-11-02 DIAGNOSIS — I739 Peripheral vascular disease, unspecified: Secondary | ICD-10-CM | POA: Diagnosis not present

## 2022-11-08 DIAGNOSIS — M1711 Unilateral primary osteoarthritis, right knee: Secondary | ICD-10-CM | POA: Diagnosis not present

## 2022-11-16 DIAGNOSIS — Z6826 Body mass index (BMI) 26.0-26.9, adult: Secondary | ICD-10-CM | POA: Diagnosis not present

## 2022-11-16 DIAGNOSIS — E1140 Type 2 diabetes mellitus with diabetic neuropathy, unspecified: Secondary | ICD-10-CM | POA: Diagnosis not present

## 2022-12-22 DIAGNOSIS — Z03818 Encounter for observation for suspected exposure to other biological agents ruled out: Secondary | ICD-10-CM | POA: Diagnosis not present

## 2022-12-22 DIAGNOSIS — R0602 Shortness of breath: Secondary | ICD-10-CM | POA: Diagnosis not present

## 2022-12-22 DIAGNOSIS — R531 Weakness: Secondary | ICD-10-CM | POA: Diagnosis not present

## 2022-12-22 DIAGNOSIS — R051 Acute cough: Secondary | ICD-10-CM | POA: Diagnosis not present

## 2023-01-17 DIAGNOSIS — R531 Weakness: Secondary | ICD-10-CM | POA: Diagnosis not present

## 2023-01-17 DIAGNOSIS — R051 Acute cough: Secondary | ICD-10-CM | POA: Diagnosis not present

## 2023-01-17 DIAGNOSIS — Z03818 Encounter for observation for suspected exposure to other biological agents ruled out: Secondary | ICD-10-CM | POA: Diagnosis not present

## 2023-01-17 DIAGNOSIS — R0602 Shortness of breath: Secondary | ICD-10-CM | POA: Diagnosis not present

## 2023-04-10 DIAGNOSIS — R42 Dizziness and giddiness: Secondary | ICD-10-CM | POA: Diagnosis not present

## 2023-04-10 DIAGNOSIS — I10 Essential (primary) hypertension: Secondary | ICD-10-CM | POA: Diagnosis not present

## 2023-04-10 DIAGNOSIS — F419 Anxiety disorder, unspecified: Secondary | ICD-10-CM | POA: Diagnosis not present

## 2023-04-10 DIAGNOSIS — E1140 Type 2 diabetes mellitus with diabetic neuropathy, unspecified: Secondary | ICD-10-CM | POA: Diagnosis not present

## 2023-04-10 DIAGNOSIS — Z6838 Body mass index (BMI) 38.0-38.9, adult: Secondary | ICD-10-CM | POA: Diagnosis not present

## 2023-04-10 DIAGNOSIS — M25561 Pain in right knee: Secondary | ICD-10-CM | POA: Diagnosis not present

## 2023-04-11 DIAGNOSIS — Z23 Encounter for immunization: Secondary | ICD-10-CM | POA: Diagnosis not present

## 2023-04-11 DIAGNOSIS — E1140 Type 2 diabetes mellitus with diabetic neuropathy, unspecified: Secondary | ICD-10-CM | POA: Diagnosis not present

## 2023-04-11 DIAGNOSIS — I10 Essential (primary) hypertension: Secondary | ICD-10-CM | POA: Diagnosis not present

## 2023-04-11 DIAGNOSIS — E785 Hyperlipidemia, unspecified: Secondary | ICD-10-CM | POA: Diagnosis not present

## 2023-04-11 DIAGNOSIS — D509 Iron deficiency anemia, unspecified: Secondary | ICD-10-CM | POA: Diagnosis not present

## 2023-04-11 DIAGNOSIS — Z79899 Other long term (current) drug therapy: Secondary | ICD-10-CM | POA: Diagnosis not present

## 2023-04-24 DIAGNOSIS — M25561 Pain in right knee: Secondary | ICD-10-CM | POA: Diagnosis not present

## 2023-04-24 DIAGNOSIS — M25562 Pain in left knee: Secondary | ICD-10-CM | POA: Diagnosis not present

## 2023-05-06 DIAGNOSIS — Z79899 Other long term (current) drug therapy: Secondary | ICD-10-CM | POA: Diagnosis not present

## 2023-05-06 DIAGNOSIS — Z794 Long term (current) use of insulin: Secondary | ICD-10-CM | POA: Diagnosis not present

## 2023-05-06 DIAGNOSIS — R109 Unspecified abdominal pain: Secondary | ICD-10-CM | POA: Diagnosis not present

## 2023-05-06 DIAGNOSIS — N3001 Acute cystitis with hematuria: Secondary | ICD-10-CM | POA: Diagnosis not present

## 2023-05-06 DIAGNOSIS — I444 Left anterior fascicular block: Secondary | ICD-10-CM | POA: Diagnosis not present

## 2023-05-06 DIAGNOSIS — I440 Atrioventricular block, first degree: Secondary | ICD-10-CM | POA: Diagnosis not present

## 2023-05-06 DIAGNOSIS — E1165 Type 2 diabetes mellitus with hyperglycemia: Secondary | ICD-10-CM | POA: Diagnosis not present

## 2023-05-06 DIAGNOSIS — N400 Enlarged prostate without lower urinary tract symptoms: Secondary | ICD-10-CM | POA: Diagnosis not present

## 2023-05-09 DIAGNOSIS — Z6827 Body mass index (BMI) 27.0-27.9, adult: Secondary | ICD-10-CM | POA: Diagnosis not present

## 2023-05-09 DIAGNOSIS — N390 Urinary tract infection, site not specified: Secondary | ICD-10-CM | POA: Diagnosis not present

## 2023-05-09 DIAGNOSIS — K5900 Constipation, unspecified: Secondary | ICD-10-CM | POA: Diagnosis not present

## 2023-05-09 DIAGNOSIS — R42 Dizziness and giddiness: Secondary | ICD-10-CM | POA: Diagnosis not present

## 2023-05-29 DIAGNOSIS — R42 Dizziness and giddiness: Secondary | ICD-10-CM | POA: Diagnosis not present

## 2023-07-13 DIAGNOSIS — M1711 Unilateral primary osteoarthritis, right knee: Secondary | ICD-10-CM | POA: Diagnosis not present

## 2023-07-30 DIAGNOSIS — N189 Chronic kidney disease, unspecified: Secondary | ICD-10-CM | POA: Diagnosis not present

## 2023-07-30 DIAGNOSIS — R4182 Altered mental status, unspecified: Secondary | ICD-10-CM | POA: Diagnosis not present

## 2023-07-30 DIAGNOSIS — R451 Restlessness and agitation: Secondary | ICD-10-CM | POA: Diagnosis not present

## 2023-07-30 DIAGNOSIS — R739 Hyperglycemia, unspecified: Secondary | ICD-10-CM | POA: Diagnosis not present

## 2023-07-30 DIAGNOSIS — D509 Iron deficiency anemia, unspecified: Secondary | ICD-10-CM | POA: Diagnosis not present

## 2023-07-30 DIAGNOSIS — Z89412 Acquired absence of left great toe: Secondary | ICD-10-CM | POA: Diagnosis not present

## 2023-07-30 DIAGNOSIS — I129 Hypertensive chronic kidney disease with stage 1 through stage 4 chronic kidney disease, or unspecified chronic kidney disease: Secondary | ICD-10-CM | POA: Diagnosis not present

## 2023-07-30 DIAGNOSIS — Z602 Problems related to living alone: Secondary | ICD-10-CM | POA: Diagnosis not present

## 2023-07-30 DIAGNOSIS — I499 Cardiac arrhythmia, unspecified: Secondary | ICD-10-CM | POA: Diagnosis not present

## 2023-07-30 DIAGNOSIS — I10 Essential (primary) hypertension: Secondary | ICD-10-CM | POA: Diagnosis not present

## 2023-07-30 DIAGNOSIS — I739 Peripheral vascular disease, unspecified: Secondary | ICD-10-CM | POA: Diagnosis not present

## 2023-07-30 DIAGNOSIS — Z905 Acquired absence of kidney: Secondary | ICD-10-CM | POA: Diagnosis not present

## 2023-07-30 DIAGNOSIS — E119 Type 2 diabetes mellitus without complications: Secondary | ICD-10-CM | POA: Diagnosis not present

## 2023-07-30 DIAGNOSIS — M869 Osteomyelitis, unspecified: Secondary | ICD-10-CM | POA: Diagnosis not present

## 2023-07-30 DIAGNOSIS — Z66 Do not resuscitate: Secondary | ICD-10-CM | POA: Diagnosis not present

## 2023-07-30 DIAGNOSIS — E785 Hyperlipidemia, unspecified: Secondary | ICD-10-CM | POA: Diagnosis not present

## 2023-07-30 DIAGNOSIS — R52 Pain, unspecified: Secondary | ICD-10-CM | POA: Diagnosis not present

## 2023-07-30 DIAGNOSIS — K625 Hemorrhage of anus and rectum: Secondary | ICD-10-CM | POA: Diagnosis not present

## 2023-07-30 DIAGNOSIS — N4889 Other specified disorders of penis: Secondary | ICD-10-CM | POA: Diagnosis not present

## 2023-07-30 DIAGNOSIS — Z515 Encounter for palliative care: Secondary | ICD-10-CM | POA: Diagnosis not present

## 2023-07-30 DIAGNOSIS — F419 Anxiety disorder, unspecified: Secondary | ICD-10-CM | POA: Diagnosis not present

## 2023-07-30 DIAGNOSIS — M79605 Pain in left leg: Secondary | ICD-10-CM | POA: Diagnosis not present

## 2023-07-30 DIAGNOSIS — E1122 Type 2 diabetes mellitus with diabetic chronic kidney disease: Secondary | ICD-10-CM | POA: Diagnosis not present

## 2023-07-30 DIAGNOSIS — Z89411 Acquired absence of right great toe: Secondary | ICD-10-CM | POA: Diagnosis not present

## 2023-07-30 DIAGNOSIS — J984 Other disorders of lung: Secondary | ICD-10-CM | POA: Diagnosis not present

## 2023-07-30 DIAGNOSIS — R110 Nausea: Secondary | ICD-10-CM | POA: Diagnosis not present

## 2023-07-30 DIAGNOSIS — Z85528 Personal history of other malignant neoplasm of kidney: Secondary | ICD-10-CM | POA: Diagnosis not present

## 2023-07-30 DIAGNOSIS — Z9621 Cochlear implant status: Secondary | ICD-10-CM | POA: Diagnosis not present

## 2023-07-30 DIAGNOSIS — R918 Other nonspecific abnormal finding of lung field: Secondary | ICD-10-CM | POA: Diagnosis not present

## 2023-07-30 DIAGNOSIS — R0602 Shortness of breath: Secondary | ICD-10-CM | POA: Diagnosis not present

## 2023-07-30 DIAGNOSIS — H9190 Unspecified hearing loss, unspecified ear: Secondary | ICD-10-CM | POA: Diagnosis not present

## 2023-07-30 DIAGNOSIS — I959 Hypotension, unspecified: Secondary | ICD-10-CM | POA: Diagnosis not present

## 2023-07-30 DIAGNOSIS — I998 Other disorder of circulatory system: Secondary | ICD-10-CM | POA: Diagnosis not present

## 2023-07-30 DIAGNOSIS — I70222 Atherosclerosis of native arteries of extremities with rest pain, left leg: Secondary | ICD-10-CM | POA: Diagnosis not present

## 2023-07-30 DIAGNOSIS — E1139 Type 2 diabetes mellitus with other diabetic ophthalmic complication: Secondary | ICD-10-CM | POA: Diagnosis not present

## 2023-07-30 DIAGNOSIS — E1165 Type 2 diabetes mellitus with hyperglycemia: Secondary | ICD-10-CM | POA: Diagnosis not present

## 2023-07-30 DIAGNOSIS — H42 Glaucoma in diseases classified elsewhere: Secondary | ICD-10-CM | POA: Diagnosis not present

## 2023-07-30 DIAGNOSIS — Z7189 Other specified counseling: Secondary | ICD-10-CM | POA: Diagnosis not present

## 2023-08-01 DIAGNOSIS — Z7189 Other specified counseling: Secondary | ICD-10-CM | POA: Diagnosis not present

## 2023-08-01 DIAGNOSIS — R110 Nausea: Secondary | ICD-10-CM | POA: Diagnosis not present

## 2023-08-01 DIAGNOSIS — Z515 Encounter for palliative care: Secondary | ICD-10-CM | POA: Diagnosis not present

## 2023-08-01 DIAGNOSIS — I998 Other disorder of circulatory system: Secondary | ICD-10-CM | POA: Diagnosis not present

## 2023-08-01 DIAGNOSIS — I739 Peripheral vascular disease, unspecified: Secondary | ICD-10-CM | POA: Diagnosis not present

## 2023-08-01 DIAGNOSIS — R52 Pain, unspecified: Secondary | ICD-10-CM | POA: Diagnosis not present

## 2023-08-02 DIAGNOSIS — R110 Nausea: Secondary | ICD-10-CM | POA: Diagnosis not present

## 2023-08-02 DIAGNOSIS — R52 Pain, unspecified: Secondary | ICD-10-CM | POA: Diagnosis not present

## 2023-08-02 DIAGNOSIS — Z515 Encounter for palliative care: Secondary | ICD-10-CM | POA: Diagnosis not present

## 2023-08-02 DIAGNOSIS — I739 Peripheral vascular disease, unspecified: Secondary | ICD-10-CM | POA: Diagnosis not present

## 2023-08-02 DIAGNOSIS — Z7189 Other specified counseling: Secondary | ICD-10-CM | POA: Diagnosis not present

## 2023-08-02 DIAGNOSIS — I998 Other disorder of circulatory system: Secondary | ICD-10-CM | POA: Diagnosis not present

## 2023-08-03 DIAGNOSIS — I739 Peripheral vascular disease, unspecified: Secondary | ICD-10-CM | POA: Diagnosis not present

## 2023-08-15 DEATH — deceased
